# Patient Record
Sex: Male | Born: 1935 | Race: White | Hispanic: No | Smoking: Former smoker
Health system: Southern US, Community
[De-identification: ages and names within clinical notes are randomized; demographics above are authoritative.]

## PROBLEM LIST (undated history)

## (undated) DIAGNOSIS — Z923 Personal history of irradiation: Secondary | ICD-10-CM

## (undated) DIAGNOSIS — K469 Unspecified abdominal hernia without obstruction or gangrene: Secondary | ICD-10-CM

## (undated) DIAGNOSIS — R011 Cardiac murmur, unspecified: Secondary | ICD-10-CM

## (undated) DIAGNOSIS — C4339 Malignant melanoma of other parts of face: Secondary | ICD-10-CM

## (undated) DIAGNOSIS — C9110 Chronic lymphocytic leukemia of B-cell type not having achieved remission: Secondary | ICD-10-CM

## (undated) DIAGNOSIS — I493 Ventricular premature depolarization: Secondary | ICD-10-CM

## (undated) DIAGNOSIS — I10 Essential (primary) hypertension: Secondary | ICD-10-CM

## (undated) DIAGNOSIS — N400 Enlarged prostate without lower urinary tract symptoms: Secondary | ICD-10-CM

## (undated) DIAGNOSIS — C434 Malignant melanoma of scalp and neck: Secondary | ICD-10-CM

## (undated) DIAGNOSIS — E119 Type 2 diabetes mellitus without complications: Secondary | ICD-10-CM

## (undated) HISTORY — DX: Type 2 diabetes mellitus without complications: E11.9

## (undated) HISTORY — DX: Essential (primary) hypertension: I10

## (undated) HISTORY — PX: EYE SURGERY: SHX253

## (undated) HISTORY — DX: Chronic lymphocytic leukemia of B-cell type not having achieved remission: C91.10

## (undated) HISTORY — DX: Benign prostatic hyperplasia without lower urinary tract symptoms: N40.0

## (undated) HISTORY — PX: PROSTATE SURGERY: SHX751

---

## 2016-01-24 DIAGNOSIS — Z08 Encounter for follow-up examination after completed treatment for malignant neoplasm: Secondary | ICD-10-CM | POA: Diagnosis not present

## 2016-01-24 DIAGNOSIS — D0339 Melanoma in situ of other parts of face: Secondary | ICD-10-CM | POA: Diagnosis not present

## 2016-01-24 DIAGNOSIS — L570 Actinic keratosis: Secondary | ICD-10-CM | POA: Diagnosis not present

## 2016-01-24 DIAGNOSIS — X32XXXA Exposure to sunlight, initial encounter: Secondary | ICD-10-CM | POA: Diagnosis not present

## 2016-01-24 DIAGNOSIS — Z85828 Personal history of other malignant neoplasm of skin: Secondary | ICD-10-CM | POA: Diagnosis not present

## 2016-02-06 DIAGNOSIS — E119 Type 2 diabetes mellitus without complications: Secondary | ICD-10-CM | POA: Diagnosis not present

## 2016-02-06 DIAGNOSIS — I493 Ventricular premature depolarization: Secondary | ICD-10-CM | POA: Diagnosis not present

## 2016-02-06 DIAGNOSIS — E785 Hyperlipidemia, unspecified: Secondary | ICD-10-CM | POA: Diagnosis not present

## 2016-02-06 DIAGNOSIS — I10 Essential (primary) hypertension: Secondary | ICD-10-CM | POA: Diagnosis not present

## 2016-02-21 DIAGNOSIS — D0339 Melanoma in situ of other parts of face: Secondary | ICD-10-CM | POA: Diagnosis not present

## 2016-02-27 DIAGNOSIS — N419 Inflammatory disease of prostate, unspecified: Secondary | ICD-10-CM | POA: Diagnosis not present

## 2016-02-27 DIAGNOSIS — R309 Painful micturition, unspecified: Secondary | ICD-10-CM | POA: Diagnosis not present

## 2016-03-01 DIAGNOSIS — R509 Fever, unspecified: Secondary | ICD-10-CM | POA: Diagnosis not present

## 2016-03-01 DIAGNOSIS — N419 Inflammatory disease of prostate, unspecified: Secondary | ICD-10-CM | POA: Diagnosis not present

## 2016-04-09 DIAGNOSIS — L089 Local infection of the skin and subcutaneous tissue, unspecified: Secondary | ICD-10-CM | POA: Diagnosis not present

## 2016-04-09 DIAGNOSIS — E1169 Type 2 diabetes mellitus with other specified complication: Secondary | ICD-10-CM | POA: Diagnosis not present

## 2016-04-18 DIAGNOSIS — E1142 Type 2 diabetes mellitus with diabetic polyneuropathy: Secondary | ICD-10-CM | POA: Diagnosis not present

## 2016-04-18 DIAGNOSIS — B353 Tinea pedis: Secondary | ICD-10-CM | POA: Diagnosis not present

## 2016-04-18 DIAGNOSIS — S90822A Blister (nonthermal), left foot, initial encounter: Secondary | ICD-10-CM | POA: Diagnosis not present

## 2016-04-18 DIAGNOSIS — L089 Local infection of the skin and subcutaneous tissue, unspecified: Secondary | ICD-10-CM | POA: Diagnosis not present

## 2016-06-26 DIAGNOSIS — D485 Neoplasm of uncertain behavior of skin: Secondary | ICD-10-CM | POA: Diagnosis not present

## 2016-06-26 DIAGNOSIS — Z08 Encounter for follow-up examination after completed treatment for malignant neoplasm: Secondary | ICD-10-CM | POA: Diagnosis not present

## 2016-06-26 DIAGNOSIS — L821 Other seborrheic keratosis: Secondary | ICD-10-CM | POA: Diagnosis not present

## 2016-06-26 DIAGNOSIS — D225 Melanocytic nevi of trunk: Secondary | ICD-10-CM | POA: Diagnosis not present

## 2016-06-26 DIAGNOSIS — Z85828 Personal history of other malignant neoplasm of skin: Secondary | ICD-10-CM | POA: Diagnosis not present

## 2016-06-26 DIAGNOSIS — Z1283 Encounter for screening for malignant neoplasm of skin: Secondary | ICD-10-CM | POA: Diagnosis not present

## 2016-07-05 DIAGNOSIS — E785 Hyperlipidemia, unspecified: Secondary | ICD-10-CM | POA: Diagnosis not present

## 2016-07-05 DIAGNOSIS — E119 Type 2 diabetes mellitus without complications: Secondary | ICD-10-CM | POA: Diagnosis not present

## 2016-07-05 DIAGNOSIS — I10 Essential (primary) hypertension: Secondary | ICD-10-CM | POA: Diagnosis not present

## 2016-07-08 DIAGNOSIS — D72828 Other elevated white blood cell count: Secondary | ICD-10-CM | POA: Diagnosis not present

## 2016-07-15 DIAGNOSIS — Z87891 Personal history of nicotine dependence: Secondary | ICD-10-CM | POA: Diagnosis not present

## 2016-07-15 DIAGNOSIS — D72820 Lymphocytosis (symptomatic): Secondary | ICD-10-CM | POA: Diagnosis not present

## 2016-07-15 DIAGNOSIS — C4492 Squamous cell carcinoma of skin, unspecified: Secondary | ICD-10-CM | POA: Diagnosis not present

## 2016-07-15 DIAGNOSIS — D696 Thrombocytopenia, unspecified: Secondary | ICD-10-CM | POA: Diagnosis not present

## 2016-07-15 DIAGNOSIS — C9110 Chronic lymphocytic leukemia of B-cell type not having achieved remission: Secondary | ICD-10-CM | POA: Diagnosis not present

## 2016-07-15 DIAGNOSIS — Z803 Family history of malignant neoplasm of breast: Secondary | ICD-10-CM | POA: Diagnosis not present

## 2016-07-15 DIAGNOSIS — L989 Disorder of the skin and subcutaneous tissue, unspecified: Secondary | ICD-10-CM | POA: Diagnosis not present

## 2016-07-19 ENCOUNTER — Other Ambulatory Visit: Payer: Self-pay | Admitting: Oncology

## 2016-07-19 DIAGNOSIS — C9110 Chronic lymphocytic leukemia of B-cell type not having achieved remission: Secondary | ICD-10-CM

## 2016-07-19 DIAGNOSIS — D696 Thrombocytopenia, unspecified: Secondary | ICD-10-CM

## 2016-07-23 ENCOUNTER — Encounter: Payer: Self-pay | Admitting: Oncology

## 2016-07-23 ENCOUNTER — Ambulatory Visit (INDEPENDENT_AMBULATORY_CARE_PROVIDER_SITE_OTHER): Payer: PPO | Admitting: Oncology

## 2016-07-23 ENCOUNTER — Encounter (INDEPENDENT_AMBULATORY_CARE_PROVIDER_SITE_OTHER): Payer: Self-pay

## 2016-07-23 VITALS — BP 169/70 | HR 62 | Temp 97.6°F | Ht 71.0 in | Wt 187.6 lb

## 2016-07-23 DIAGNOSIS — N400 Enlarged prostate without lower urinary tract symptoms: Secondary | ICD-10-CM

## 2016-07-23 DIAGNOSIS — R161 Splenomegaly, not elsewhere classified: Secondary | ICD-10-CM | POA: Diagnosis not present

## 2016-07-23 DIAGNOSIS — Q928 Other specified trisomies and partial trisomies of autosomes: Secondary | ICD-10-CM

## 2016-07-23 DIAGNOSIS — Z8042 Family history of malignant neoplasm of prostate: Secondary | ICD-10-CM

## 2016-07-23 DIAGNOSIS — E119 Type 2 diabetes mellitus without complications: Secondary | ICD-10-CM

## 2016-07-23 DIAGNOSIS — E785 Hyperlipidemia, unspecified: Secondary | ICD-10-CM

## 2016-07-23 DIAGNOSIS — D696 Thrombocytopenia, unspecified: Secondary | ICD-10-CM | POA: Diagnosis not present

## 2016-07-23 DIAGNOSIS — Z820 Family history of epilepsy and other diseases of the nervous system: Secondary | ICD-10-CM

## 2016-07-23 DIAGNOSIS — C9110 Chronic lymphocytic leukemia of B-cell type not having achieved remission: Secondary | ICD-10-CM

## 2016-07-23 DIAGNOSIS — I10 Essential (primary) hypertension: Secondary | ICD-10-CM

## 2016-07-23 DIAGNOSIS — Z8052 Family history of malignant neoplasm of bladder: Secondary | ICD-10-CM

## 2016-07-23 DIAGNOSIS — Z87891 Personal history of nicotine dependence: Secondary | ICD-10-CM

## 2016-07-23 DIAGNOSIS — Z803 Family history of malignant neoplasm of breast: Secondary | ICD-10-CM

## 2016-07-23 DIAGNOSIS — D72820 Lymphocytosis (symptomatic): Secondary | ICD-10-CM | POA: Diagnosis not present

## 2016-07-23 DIAGNOSIS — Z818 Family history of other mental and behavioral disorders: Secondary | ICD-10-CM

## 2016-07-23 LAB — DIRECT ANTIGLOBULIN TEST (NOT AT ARMC)
DAT, COMPLEMENT: NEGATIVE
DAT, IGG: NEGATIVE

## 2016-07-23 LAB — CBC WITH DIFFERENTIAL/PLATELET
BASOS ABS: 0 10*3/uL (ref 0.0–0.1)
Basophils Relative: 0 %
EOS ABS: 0.1 10*3/uL (ref 0.0–0.7)
Eosinophils Relative: 1 %
HCT: 40.2 % (ref 39.0–52.0)
Hemoglobin: 13.3 g/dL (ref 13.0–17.0)
LYMPHS ABS: 10.4 10*3/uL — AB (ref 0.7–4.0)
Lymphocytes Relative: 76 %
MCH: 29.6 pg (ref 26.0–34.0)
MCHC: 33.1 g/dL (ref 30.0–36.0)
MCV: 89.5 fL (ref 78.0–100.0)
MONO ABS: 0.5 10*3/uL (ref 0.1–1.0)
Monocytes Relative: 4 %
NEUTROS PCT: 19 %
Neutro Abs: 2.6 10*3/uL (ref 1.7–7.7)
PLATELETS: 121 10*3/uL — AB (ref 150–400)
RBC: 4.49 MIL/uL (ref 4.22–5.81)
RDW: 14.6 % (ref 11.5–15.5)
WBC: 13.6 10*3/uL — AB (ref 4.0–10.5)

## 2016-07-23 LAB — RETICULOCYTES
RBC.: 4.49 MIL/uL (ref 4.22–5.81)
Retic Count, Absolute: 62.9 10*3/uL (ref 19.0–186.0)
Retic Ct Pct: 1.4 % (ref 0.4–3.1)

## 2016-07-23 LAB — LACTATE DEHYDROGENASE: LDH: 150 U/L (ref 98–192)

## 2016-07-23 NOTE — Patient Instructions (Signed)
We will call you with appointment for a CT scan of the abdomen to be done at [facility] hospital Radiology dept Blood counts in 2 months and again in 4 months MD visit with Dr Darnell Level in 4 months

## 2016-07-24 LAB — IMMUNOFIXATION ELECTROPHORESIS
IGA: 211 mg/dL (ref 61–437)
IgG (Immunoglobin G), Serum: 743 mg/dL (ref 700–1600)
IgM, Serum: 59 mg/dL (ref 15–143)
Total Protein ELP: 6.5 g/dL (ref 6.0–8.5)

## 2016-07-24 LAB — SAVE SMEAR

## 2016-07-24 LAB — PATHOLOGIST SMEAR REVIEW

## 2016-07-24 NOTE — Progress Notes (Signed)
New Patient Hematology   Thomas Zavala OU:[id number] 1927/11/01 82 y.o. 07/24/2016  CC: Bradly Bienenstock NP; Everardo All, MD [facility] oncology specialists [place], [place], [place]   Reason for referral: Further management of recently diagnosed chronic lymphocytic leukemia   HPI:  Pleasant 82 year old man who has been in overall good health with no major medical or surgical illness. He has treated hypertension and type 2 diabetes on metformin. Benign prostatic hyperplasia. Transurethral resection of the prostate about 20 years ago. Repeat surgery in April of this year. He was told white count was elevated at that time. White count was 18,800, hemoglobin 12, platelet 171,000, on 12/07/2015. He had not had a general physical exam in over one year. He just relocated here from [place]. He was evaluated by a local nurse practitioner in [place]. Routine laboratory analysis done on 07/08/2016 showed a elevated white count 27,800, hemoglobin 13.4, platelet count 101,000. White count differential not done on that specimen. He was referred for an urgent hematology oncology evaluation and saw Dr. Don Broach who was kind enough to call me and send records. Due to insurance reasons patient requests change of oncologists. Dr. Tressie Stalker repeated the CBC with a differential on November 27. White count 27,000 with 22 neutrophils, 74% lymphocytes, hemoglobin 14, hematocrit 41, MCV 86, platelet count 111,000. Review of the peripheral blood showed a predominant population of mature lymphocytes and presence of smudge cells. He did not appreciate any adenopathy or organomegaly on his exam. He was concerned with the thrombocytopenia in the absence of anemia that there may be another reason for the low platelets, potentially alcohol-related liver disease. Chemistry profile showed normal liver functions. He recommended the patient get a CT scan of the abdomen for further  evaluation.  A specimen of peripheral blood was sent for flow cytometry and confirmed suspicion of chronic lymphocytic leukemia with a monoclonal population of B cells with a CLL/SLL phenotype.   cytogenetics: trisomy 26  [place] gene mutation status: unmutated  He has had no constitutional symptoms i.e. no fever, weight loss, or night sweats. He does admit to easy bruisability. He has noted some erythematous changes of the skin of his feet.  He had the shingles vaccine about 5 years ago. Pneumonia vaccine 1 year ago. Flu vaccine this year.  PMH:  Medical history: Hypertension; type 2 diabetes; BPH. No history of MI, ulcers, hepatitis, yellow jaundice, malaria, mononucleosis, tuberculosis, pneumonia, asthma, emphysema, thyroid disease, inflammatory arthritis, seizure, or stroke.   Surgical history: Transurethral resection of the prostate 20 years ago. Recent laser procedure on the prostate April 2017.  Allergies: No Known Allergies  Medications: Hyzaar 100-25 one daily, metformin XL or 500 mg 1 daily, Lopressor 50 mg 3 times a day, pravastatin 40 mg daily.  Family History: His father lived  toage 99-1/2. Mother lived into her 41s. Had dementia. He had 6 siblings. A set of male twins died 2 weeks after birth. One brother died of bladder cancer. Another brother had prostate cancer but this did not result in his death. A sister died at age 63 or 18 unknown causes. Another brother is deceased. One surviving sibling a sister aged 105 who is in good health.  Social History: Retired. 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 First wife died about 9 years ago. Diagnosis of lymphoma made day before she died by liver biopsy. Second wife just died in March 0000000 of complications of alcohol related cirrhosis. 3 daughters. One daughter accompanies  him today, Lanelle Bal, diagnosed with breast cancer at age approximately 34, currently in remission. Another daughter alive and well in [place]. A  third daughter who lives in [place] has cerebral palsy but is living independently.  He quit smoking after 25 years of use in 1978.[name] He has never used smokeless tobacco. His  alcohol consumption heavy in the past but he will not quantify how much. He stopped drinking completely over the last year and a half when his wife was struggling with alcoholic liver disease. He currently just drinks an occasional glass of wine or beer. He does not use drugs.   Review of Systems: See history of present illness Remaining ROS negative.  Physical Exam: Blood pressure (!) 169/70, pulse 62, temperature 97.6 F (36.4 C), temperature source Oral, height 5\' 11"  (1.803 m), weight 187 lb 9.6 oz (85.1 kg), SpO2 100 %. Wt Readings from Last 3 Encounters:  07/23/16 187 lb 9.6 oz (85.1 kg)     General appearance: Well nourished Caucasian man HENNT: Pharynx no erythema, exudate, mass, or ulcer. No thyromegaly or thyroid nodules Lymph nodes: No cervical, supraclavicular, or axillary lymphadenopathy Breasts:  Lungs: Clear to auscultation, resonant to percussion throughout Heart: Regular rhythm, no murmur, no gallop, no rub, no click, no edema Abdomen: Soft, nontender, normal bowel sounds, no mass, Spleen palpable 5 cm below left costal margin with firm edge. No hepatomegaly. Extremities: No edema, no calf tenderness Musculoskeletal: no joint deformities GU: No inguinal adenopathy Vascular: Carotid pulses 2+, no bruits, distal pulses: Dorsalis pedis 1+ symmetric Neurologic: Alert, oriented, PERRLA, optic discs sharp and vessels normal, no hemorrhage or exudate, cranial nerves grossly normal, motor strength 5 over 5, reflexes 1+ symmetric, upper body coordination normal, gait normal, Skin: No rash or ecchymosis    Lab Results: Lab Results  Component Value Date   WBC 13.6 (H) 07/23/2016   HGB 13.3 07/23/2016   HCT 40.2 07/23/2016   MCV 89.5 07/23/2016   PLT 121 (L) 07/23/2016     Chemistry   No results  found for: NA, K, CL, CO2, BUN, CREATININE, GLU No results found for: CALCIUM, ALKPHOS, AST, ALT, BILITOT  White count differential: 76% lymphocytes, 19 neutrophils, 4 monocytes, 1 eosinophil  Direct [place] test negative, reticulocyte count 1.4%, LDH 150 lab normal 192 or below Immunoglobulins: normal and no monoclonal protein on IFE  On 07/15/2016: Random glucose 136, BUN 12, creatinine 1.0, bilirubin 0.5, alkaline phosphatase 89, SGOT 18, SGPT 14    Pathology: See discussion above. Flow cytometry peripheral blood consistent with CLL   Review of peripheral blood film:  Normochromic normocytic red cells, no spherocytes, schistocytes, or polychromasia. Monomorphic population of  Lymphocytes. Many of the lymphocytes are abnormal with lobulated nuclei, abundant, pale blue cytoplasm, more suggestive of a T rather than B cell process. Presence of smudge cells.   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 Splenomegaly in the absence of lymphadenopathy. Only mild leukocytosis.  Although splenomegaly likely secondary to CLL, I agree with Dr. Tressie Stalker, it would be of value to take a look at his liver with additional imaging to rule out portal hypertension as the etiology of the splenomegaly.  Diagnosis, prognosis, treatment options, discussed at length with the patient and his daughter Lanelle Bal. Written summary given to them for review. He is currently asymptomatic, still in early stage, preferred treatment at this time is observation alone. Although unmutated heavy chain immunoglobulin status is a negative prognostic feature, outside of a clinical  trial, we are currently not treating these patients differently at presentation if still at early stage.  We reviewed current advances in the treatment of CLL. He has no underlying cardiac history. He would be a good candidate for a B cell receptor antagonist such as Ibrutinib when and  if treatment is required which may not be for many years. Alternative treatment that would be reasonable for a man of his age would be a combination of chlorambucil plus Rituxan or Obinutuzumab anti-CD20 antibody therapy.  Plan at this time is to repeat blood counts in 2 months, 4 months, to assess pace of disease. CT scan of the abdomen and pelvis for better assessment of his liver and spleen and any additional adenopathy.    Murriel Hopper, MD, [place]  Hematology-Oncology/Internal Medicine  07/24/2016, 9:23 AM

## 2016-07-25 ENCOUNTER — Encounter: Payer: Self-pay | Admitting: Oncology

## 2016-07-25 DIAGNOSIS — I10 Essential (primary) hypertension: Secondary | ICD-10-CM | POA: Insufficient documentation

## 2016-07-25 DIAGNOSIS — E785 Hyperlipidemia, unspecified: Secondary | ICD-10-CM | POA: Insufficient documentation

## 2016-07-25 DIAGNOSIS — N400 Enlarged prostate without lower urinary tract symptoms: Secondary | ICD-10-CM | POA: Insufficient documentation

## 2016-07-25 DIAGNOSIS — C9110 Chronic lymphocytic leukemia of B-cell type not having achieved remission: Secondary | ICD-10-CM | POA: Insufficient documentation

## 2016-07-25 DIAGNOSIS — R161 Splenomegaly, not elsewhere classified: Secondary | ICD-10-CM | POA: Insufficient documentation

## 2016-07-25 DIAGNOSIS — D696 Thrombocytopenia, unspecified: Secondary | ICD-10-CM | POA: Insufficient documentation

## 2016-07-25 DIAGNOSIS — E119 Type 2 diabetes mellitus without complications: Secondary | ICD-10-CM | POA: Insufficient documentation

## 2016-07-25 HISTORY — DX: Type 2 diabetes mellitus without complications: E11.9

## 2016-07-25 HISTORY — DX: Benign prostatic hyperplasia without lower urinary tract symptoms: N40.0

## 2016-07-31 ENCOUNTER — Ambulatory Visit (HOSPITAL_COMMUNITY): Admission: RE | Admit: 2016-07-31 | Payer: PPO | Source: Ambulatory Visit

## 2016-08-07 DIAGNOSIS — D485 Neoplasm of uncertain behavior of skin: Secondary | ICD-10-CM | POA: Diagnosis not present

## 2016-08-07 DIAGNOSIS — X32XXXD Exposure to sunlight, subsequent encounter: Secondary | ICD-10-CM | POA: Diagnosis not present

## 2016-08-07 DIAGNOSIS — L089 Local infection of the skin and subcutaneous tissue, unspecified: Secondary | ICD-10-CM | POA: Diagnosis not present

## 2016-08-07 DIAGNOSIS — C44622 Squamous cell carcinoma of skin of right upper limb, including shoulder: Secondary | ICD-10-CM | POA: Diagnosis not present

## 2016-08-07 DIAGNOSIS — L570 Actinic keratosis: Secondary | ICD-10-CM | POA: Diagnosis not present

## 2016-08-23 ENCOUNTER — Telehealth: Payer: Self-pay | Admitting: *Deleted

## 2016-08-23 DIAGNOSIS — C9110 Chronic lymphocytic leukemia of B-cell type not having achieved remission: Secondary | ICD-10-CM

## 2016-08-23 NOTE — Telephone Encounter (Signed)
Dr Beryle Beams informed.

## 2016-08-23 NOTE — Telephone Encounter (Signed)
Call from radiology - pt is having CT on 1/9 at [facility] and will need creatinine level. States if order is placed; it can be drawn when pt arrives for his appt.  Order entered - please check.

## 2016-08-27 ENCOUNTER — Ambulatory Visit (HOSPITAL_COMMUNITY): Admission: RE | Admit: 2016-08-27 | Payer: PPO | Source: Ambulatory Visit

## 2016-09-03 ENCOUNTER — Ambulatory Visit (HOSPITAL_COMMUNITY)
Admission: RE | Admit: 2016-09-03 | Discharge: 2016-09-03 | Disposition: A | Discharge status: Home / Self Care | Payer: PPO | Source: Ambulatory Visit | Attending: Oncology | Admitting: Oncology

## 2016-09-03 ENCOUNTER — Encounter (HOSPITAL_COMMUNITY): Payer: Self-pay

## 2016-09-03 DIAGNOSIS — C9110 Chronic lymphocytic leukemia of B-cell type not having achieved remission: Secondary | ICD-10-CM | POA: Diagnosis not present

## 2016-09-03 DIAGNOSIS — R161 Splenomegaly, not elsewhere classified: Secondary | ICD-10-CM | POA: Diagnosis not present

## 2016-09-03 DIAGNOSIS — D696 Thrombocytopenia, unspecified: Secondary | ICD-10-CM | POA: Insufficient documentation

## 2016-09-03 DIAGNOSIS — R911 Solitary pulmonary nodule: Secondary | ICD-10-CM | POA: Diagnosis not present

## 2016-09-03 DIAGNOSIS — K5730 Diverticulosis of large intestine without perforation or abscess without bleeding: Secondary | ICD-10-CM | POA: Diagnosis not present

## 2016-09-03 DIAGNOSIS — D1803 Hemangioma of intra-abdominal structures: Secondary | ICD-10-CM | POA: Diagnosis not present

## 2016-09-03 DIAGNOSIS — N400 Enlarged prostate without lower urinary tract symptoms: Secondary | ICD-10-CM | POA: Insufficient documentation

## 2016-09-03 DIAGNOSIS — R591 Generalized enlarged lymph nodes: Secondary | ICD-10-CM | POA: Diagnosis not present

## 2016-09-03 LAB — POCT I-STAT CREATININE
CREATININE: 1 mg/dL (ref 0.61–1.24)
Creatinine, Ser: 1 mg/dL (ref 0.61–1.24)

## 2016-09-03 IMAGING — CT CT ABD-PELV W/ CM
2 of 5 series · 16 of 46 positions shown, 18 images · IV contrast (APPLIED)
Comparison: None.

CLINICAL DATA: Newly diagnosed chronic lymphocytic leukemia.
Splenomegaly. Thrombocytopenia.

EXAM:
CT ABDOMEN AND PELVIS WITH CONTRAST
TECHNIQUE: Multidetector CT imaging of the abdomen and pelvis was performed
using the standard protocol following bolus administration of
intravenous contrast.
CONTRAST:  100 mL Omnipaque 300

[Series 2: axial st · axial · 0.79mm/px · z∈[-239,+136]mm · 13 of 85 slices shown, 15 images]
[im 5/85  soft-tissue]
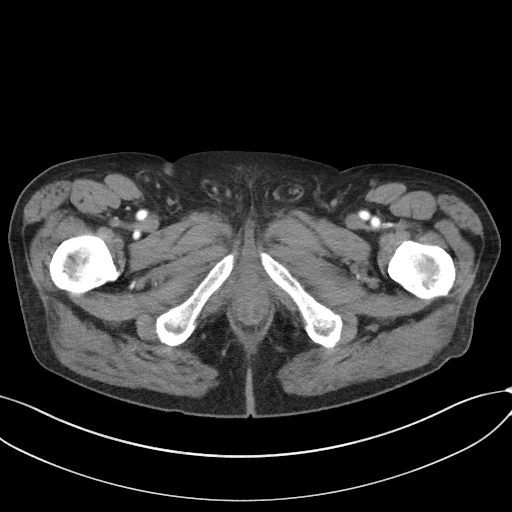
[im 5/85  bone]
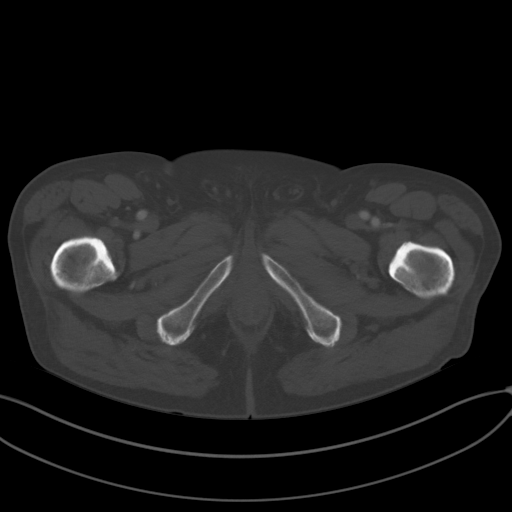
[im 14/85  soft-tissue]
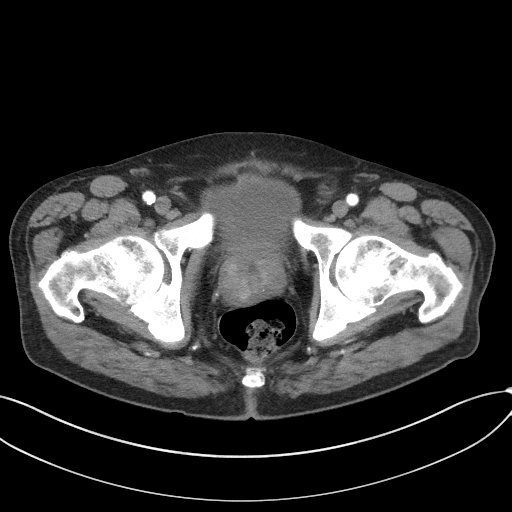
[im 18/85  soft-tissue]
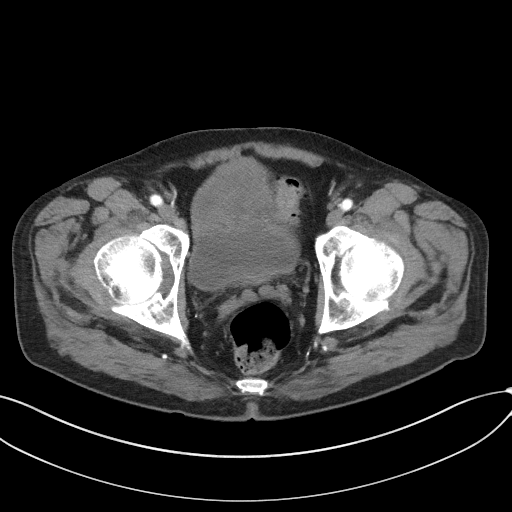
[im 23/85  soft-tissue]
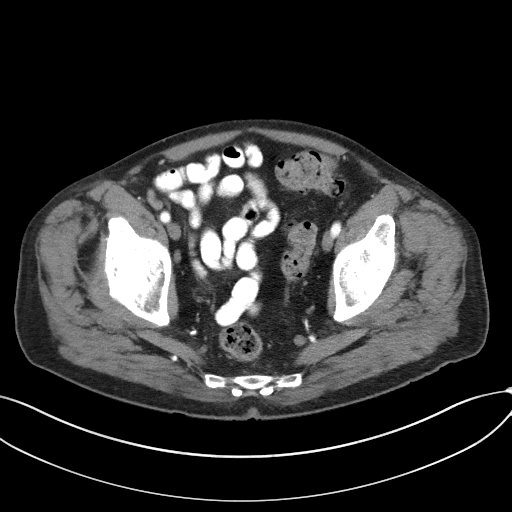
[im 31/85  soft-tissue]
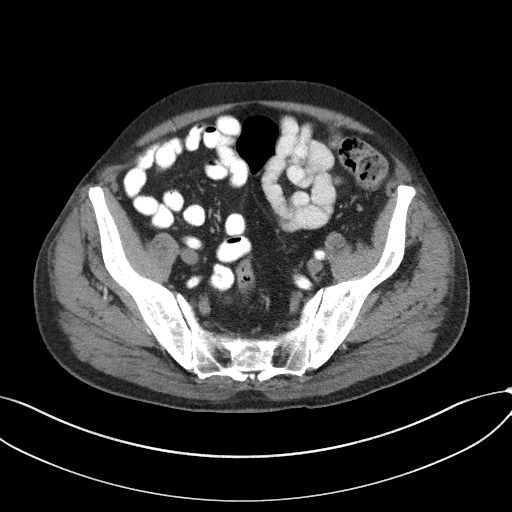
[im 36/85  soft-tissue]
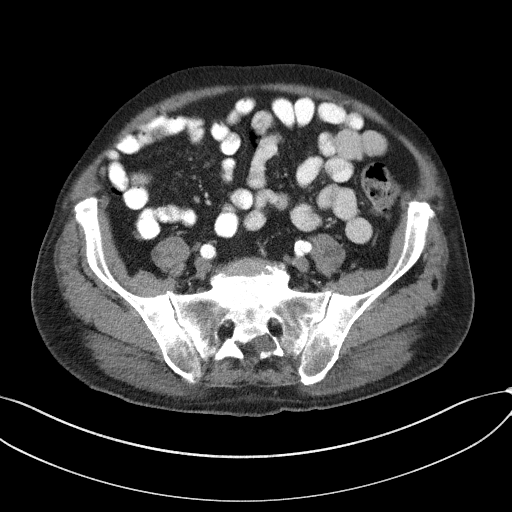
[im 45/85  soft-tissue]
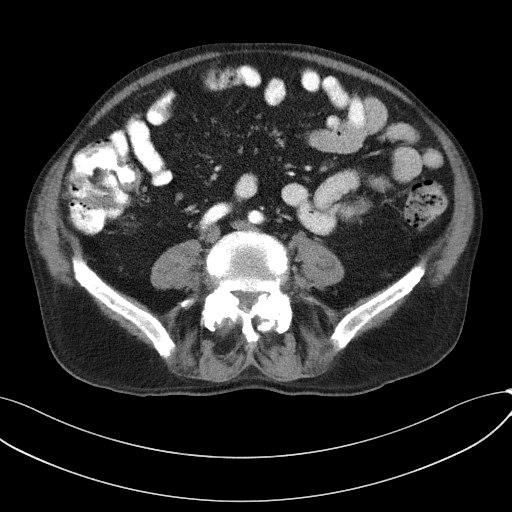
[im 49/85  soft-tissue]
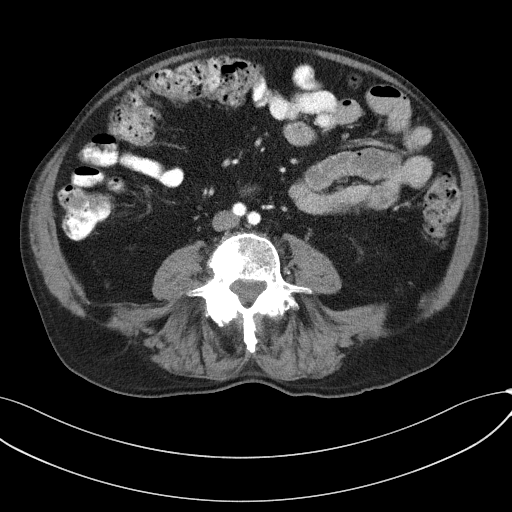
[im 54/85  soft-tissue]
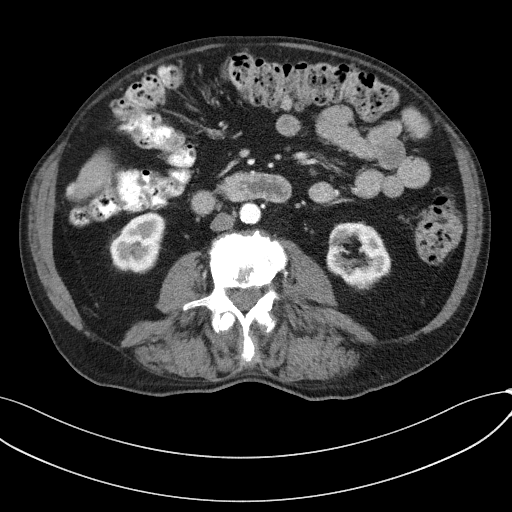
[im 54/85  bone]
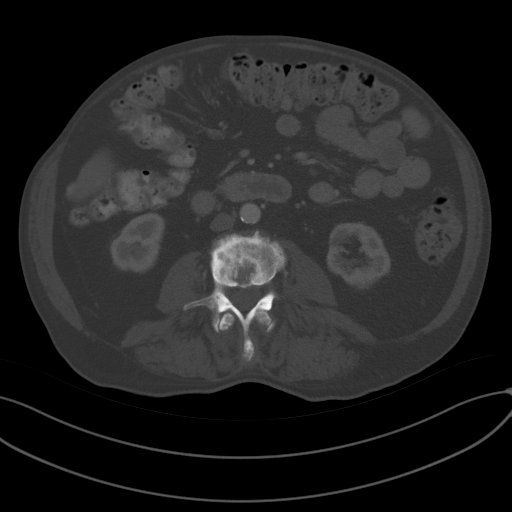
[im 62/85  soft-tissue]
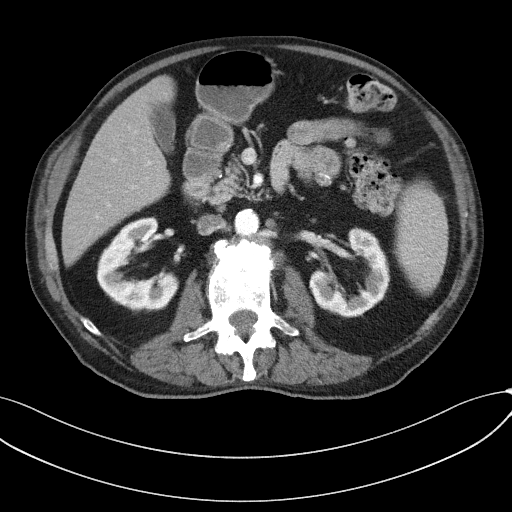
[im 67/85  soft-tissue]
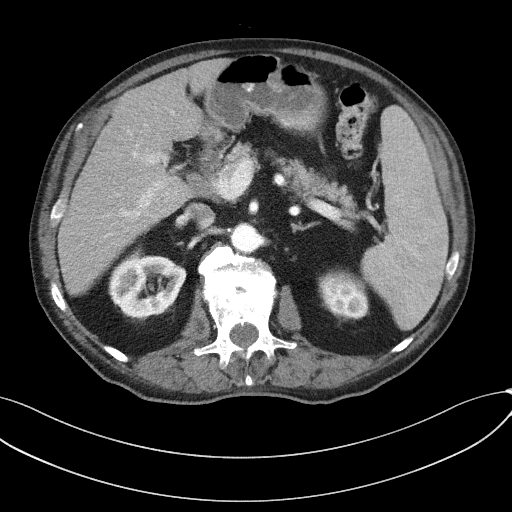
[im 71/85  soft-tissue]
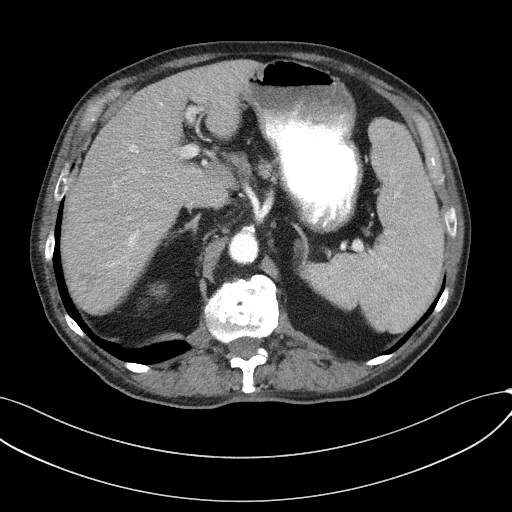
[im 80/85  soft-tissue]
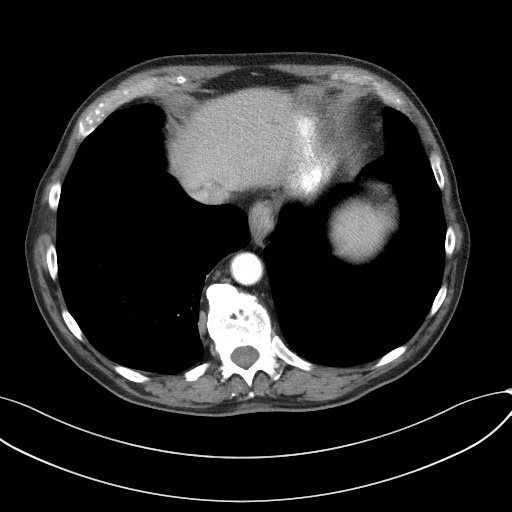

[Series 4: coronal st · coronal · 0.76mm/px · 3 of 99 slices shown]
[im 33/99  soft-tissue]
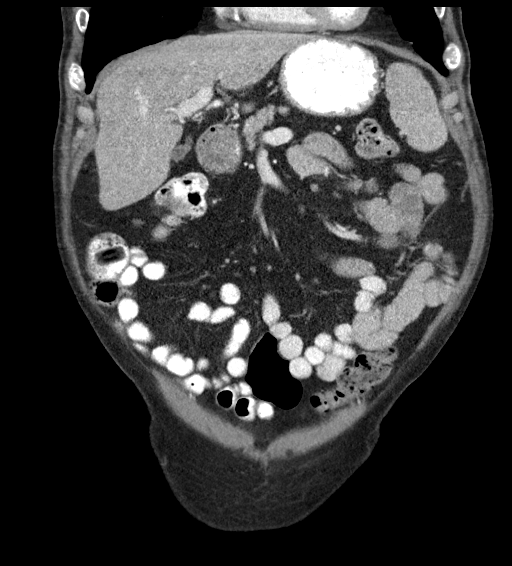
[im 44/99  soft-tissue]
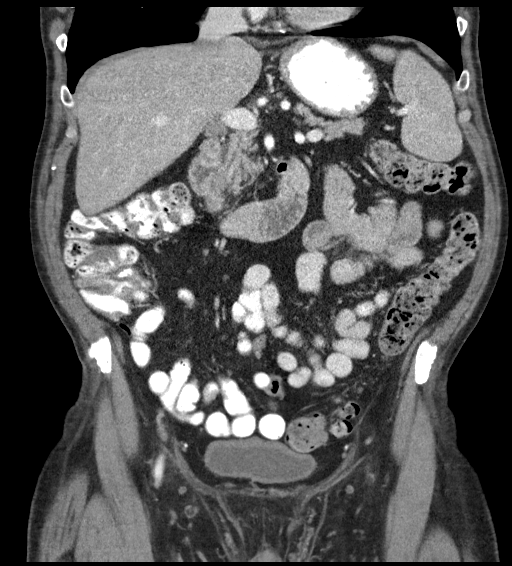
[im 55/99  soft-tissue]
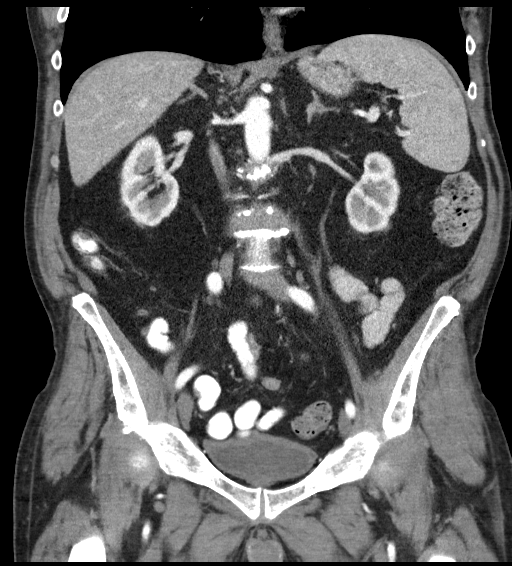

[16 of 46 positions shown; findings below may reference images not displayed]

FINDINGS: Lower Chest: 4 mm pulmonary nodule in inferior aspect of right
middle lobe.

Hepatobiliary: 2 cm lesion in posterior right hepatic lobe with
discontinuous peripheral nodular enhancement, consistent with benign
hemangioma. No other liver masses identified. Gallbladder is
unremarkable.

Pancreas:  No evidence of mass or inflammatory changes.

Spleen: Spleen measures 8.8 x 15.6 x 5.4 cm (volume = 390 cm^3).
Small low-attenuation lesion anterior aspect of spleen measuring
cm. No other splenic lesions identified.

Adrenals/Urinary Tract: No masses identified. No evidence of
hydronephrosis. Mild diffuse bladder wall thickening, most likely
due to chronic bladder outlet obstruction given enlarged prostate.

Stomach/Bowel: No evidence of obstruction, inflammatory process or
abnormal fluid collections. Colonic diverticulosis noted, without
evidence of diverticulitis.

Vascular/Lymphatic: Mildly enlarged abdominal lymph nodes seen in
the gastrohepatic ligament measuring 11 mm on image [DATE], and in the
porta hepatis measuring 14 mm on image [DATE]. Tiny less than 1 cm
lymph nodes are seen within the abdominal retroperitoneum and
bilateral iliac chains. A 10 mm right external iliac lymph node is
seen on image 56/2.

Reproductive:  Mildly enlarged prostate gland with TURP defect.

Other:  None.

Musculoskeletal: No suspicious bone lesions identified. Degenerative
lumbar spondylosis and multiple Schmorl's nodes noted.
IMPRESSION: Borderline splenomegaly with 1.3 cm low-attenuation lesion in the
anterior aspect of spleen.

Mild upper abdominal and right iliac lymphadenopathy.

4 mm indeterminate pulmonary nodule in right lung base. Recommend
continued attention on follow-up CT.

Incidentally noted benign right hepatic lobe hemangioma, mildly and
enlarged prostate, and colonic diverticulosis.

## 2016-09-03 MED ORDER — IOPAMIDOL (ISOVUE-300) INJECTION 61%
100.0000 mL | Freq: Once | INTRAVENOUS | Status: AC | PRN
  Administered 2016-09-03: 100 mL via INTRAVENOUS

## 2016-09-03 MED ORDER — IOPAMIDOL (ISOVUE-300) INJECTION 61%
INTRAVENOUS | Status: AC
  Filled 2016-09-03: qty 100

## 2016-09-24 ENCOUNTER — Telehealth: Payer: Self-pay | Admitting: Oncology

## 2016-09-24 NOTE — Telephone Encounter (Signed)
Wanted to know if he should fast for labwork, was told no he didn't need to

## 2016-09-24 NOTE — Telephone Encounter (Signed)
Pt has question for nurse °

## 2016-09-25 ENCOUNTER — Other Ambulatory Visit (INDEPENDENT_AMBULATORY_CARE_PROVIDER_SITE_OTHER): Payer: PPO

## 2016-09-25 DIAGNOSIS — R161 Splenomegaly, not elsewhere classified: Secondary | ICD-10-CM | POA: Diagnosis not present

## 2016-09-25 DIAGNOSIS — C9110 Chronic lymphocytic leukemia of B-cell type not having achieved remission: Secondary | ICD-10-CM | POA: Diagnosis not present

## 2016-09-25 DIAGNOSIS — D696 Thrombocytopenia, unspecified: Secondary | ICD-10-CM

## 2016-09-26 ENCOUNTER — Telehealth: Payer: Self-pay | Admitting: *Deleted

## 2016-09-26 ENCOUNTER — Other Ambulatory Visit: Payer: Self-pay | Admitting: Oncology

## 2016-09-26 LAB — CBC WITH DIFFERENTIAL/PLATELET
BASOS: 0 %
Basophils Absolute: 0.1 10*3/uL (ref 0.0–0.2)
EOS (ABSOLUTE): 0.1 10*3/uL (ref 0.0–0.4)
EOS: 1 %
HEMATOCRIT: 41.8 % (ref 37.5–51.0)
HEMOGLOBIN: 13.7 g/dL (ref 13.0–17.7)
IMMATURE GRANS (ABS): 0 10*3/uL (ref 0.0–0.1)
IMMATURE GRANULOCYTES: 0 %
Lymphocytes Absolute: 21 10*3/uL — ABNORMAL HIGH (ref 0.7–3.1)
Lymphs: 88 %
MCH: 30.1 pg (ref 26.6–33.0)
MCHC: 32.8 g/dL (ref 31.5–35.7)
MCV: 92 fL (ref 79–97)
MONOS ABS: 0.3 10*3/uL (ref 0.1–0.9)
Monocytes: 1 %
NEUTROS PCT: 10 %
Neutrophils Absolute: 2.5 10*3/uL (ref 1.4–7.0)
Platelets: 114 10*3/uL — ABNORMAL LOW (ref 150–379)
RBC: 4.55 x10E6/uL (ref 4.14–5.80)
RDW: 14.8 % (ref 12.3–15.4)
WBC: 24 10*3/uL — AB (ref 3.4–10.8)

## 2016-09-26 LAB — COMPREHENSIVE METABOLIC PANEL
A/G RATIO: 2 (ref 1.2–2.2)
ALT: 16 IU/L (ref 0–44)
AST: 21 IU/L (ref 0–40)
Albumin: 4.4 g/dL (ref 3.5–4.7)
Alkaline Phosphatase: 88 IU/L (ref 39–117)
BILIRUBIN TOTAL: 0.7 mg/dL (ref 0.0–1.2)
BUN/Creatinine Ratio: 12 (ref 10–24)
BUN: 12 mg/dL (ref 8–27)
CHLORIDE: 94 mmol/L — AB (ref 96–106)
CO2: 27 mmol/L (ref 18–29)
Calcium: 9.1 mg/dL (ref 8.6–10.2)
Creatinine, Ser: 1.03 mg/dL (ref 0.76–1.27)
GFR calc non Af Amer: 65 mL/min/{1.73_m2} (ref >59)
GFR, EST AFRICAN AMERICAN: 76 mL/min/{1.73_m2} (ref >59)
Globulin, Total: 2.2 g/dL (ref 1.5–4.5)
Glucose: 158 mg/dL — ABNORMAL HIGH (ref 65–99)
POTASSIUM: 4.1 mmol/L (ref 3.5–5.2)
Sodium: 138 mmol/L (ref 134–144)
Total Protein: 6.6 g/dL (ref 6.0–8.5)

## 2016-09-26 LAB — URIC ACID: URIC ACID: 6.2 mg/dL (ref 3.7–8.6)

## 2016-09-26 LAB — LACTATE DEHYDROGENASE: LDH: 181 IU/L (ref 121–224)

## 2016-09-26 NOTE — Telephone Encounter (Addendum)
Pt called / informed "white count up from 14,000 to 24,000, slight further decrease in platelet count. No anemia. I am not concerned - this is expected pattern. Continue to watch. Repeat CBC again in 2 months. Blood sugar 158." per Dr Beryle Beams. Stated he has an appt 4/23 and will wait until then to get labs. Also he has My Chart now and will start looking at his results.

## 2016-09-26 NOTE — Telephone Encounter (Signed)
-----   Message from Annia Belt, MD sent at 09/26/2016  9:36 AM EST ----- Call pt: white count up from 14,000 to 24,000, slight further decrease in platelet count. No anemia. I am not concerned - this is expected pattern. Continue to watch. Repeat CBC again in 2 months.  Blood sugar 158.- known diabetic on glucophage

## 2016-10-21 DIAGNOSIS — E119 Type 2 diabetes mellitus without complications: Secondary | ICD-10-CM | POA: Diagnosis not present

## 2016-10-21 DIAGNOSIS — H2513 Age-related nuclear cataract, bilateral: Secondary | ICD-10-CM | POA: Diagnosis not present

## 2016-10-21 DIAGNOSIS — H35033 Hypertensive retinopathy, bilateral: Secondary | ICD-10-CM | POA: Diagnosis not present

## 2016-11-06 ENCOUNTER — Encounter: Payer: Self-pay | Admitting: Oncology

## 2016-11-06 ENCOUNTER — Other Ambulatory Visit: Payer: Self-pay | Admitting: Oncology

## 2016-11-06 DIAGNOSIS — C9110 Chronic lymphocytic leukemia of B-cell type not having achieved remission: Secondary | ICD-10-CM

## 2016-11-06 NOTE — Progress Notes (Signed)
Phone conversation with patient and his daughter on March 56, [id number].  83 year old man recently diagnosed with stage II chronic lymphocytic leukemia.  He has not required treatment yet.  White count slowly rising and was 14,000 in December 2017 and 24,000 and February 2018.  He has splenomegaly by exam confirmed on CT scan. He lives with his daughter.  She was concerned with new symptoms of increased sweating.  Patient states this is primarily in his groin area which is quite unusual.  It is been going on for about 2 weeks.  Also affects the trunk.  He does wear a T-shirt to bed.  It has been damp but not really soaked except perhaps for 1 night.  He notices this most in the morning when he wakes up.  He otherwise feels quite well.  He has not checked his temperature.  He is eating well and gaining weight. I told the patient and his daughter I did not feel that there were any red flag signs here.  We will continue to monitor him clinically and I will move up his lab and MD visit to mid April.

## 2016-11-13 DIAGNOSIS — Z1283 Encounter for screening for malignant neoplasm of skin: Secondary | ICD-10-CM | POA: Diagnosis not present

## 2016-11-13 DIAGNOSIS — Z85820 Personal history of malignant melanoma of skin: Secondary | ICD-10-CM | POA: Diagnosis not present

## 2016-11-13 DIAGNOSIS — X32XXXD Exposure to sunlight, subsequent encounter: Secondary | ICD-10-CM | POA: Diagnosis not present

## 2016-11-13 DIAGNOSIS — Z08 Encounter for follow-up examination after completed treatment for malignant neoplasm: Secondary | ICD-10-CM | POA: Diagnosis not present

## 2016-11-13 DIAGNOSIS — L570 Actinic keratosis: Secondary | ICD-10-CM | POA: Diagnosis not present

## 2016-11-14 ENCOUNTER — Telehealth: Payer: Self-pay | Admitting: General Practice

## 2016-11-14 NOTE — Telephone Encounter (Signed)
APT. REMINDER CALL, LMTCB °

## 2016-11-18 ENCOUNTER — Other Ambulatory Visit (INDEPENDENT_AMBULATORY_CARE_PROVIDER_SITE_OTHER): Payer: PPO

## 2016-11-18 DIAGNOSIS — R161 Splenomegaly, not elsewhere classified: Secondary | ICD-10-CM | POA: Diagnosis not present

## 2016-11-18 DIAGNOSIS — C9110 Chronic lymphocytic leukemia of B-cell type not having achieved remission: Secondary | ICD-10-CM | POA: Diagnosis not present

## 2016-11-18 DIAGNOSIS — D696 Thrombocytopenia, unspecified: Secondary | ICD-10-CM

## 2016-11-19 LAB — COMPREHENSIVE METABOLIC PANEL
A/G RATIO: 2.1 (ref 1.2–2.2)
ALBUMIN: 4.1 g/dL (ref 3.5–4.7)
ALT: 15 IU/L (ref 0–44)
AST: 19 IU/L (ref 0–40)
Alkaline Phosphatase: 86 IU/L (ref 39–117)
BUN / CREAT RATIO: 13 (ref 10–24)
BUN: 15 mg/dL (ref 8–27)
Bilirubin Total: 0.4 mg/dL (ref 0.0–1.2)
CO2: 29 mmol/L (ref 18–29)
Calcium: 8.9 mg/dL (ref 8.6–10.2)
Chloride: 95 mmol/L — ABNORMAL LOW (ref 96–106)
Creatinine, Ser: 1.15 mg/dL (ref 0.76–1.27)
GFR calc non Af Amer: 57 mL/min/{1.73_m2} — ABNORMAL LOW (ref >59)
GFR, EST AFRICAN AMERICAN: 66 mL/min/{1.73_m2} (ref >59)
GLOBULIN, TOTAL: 2 g/dL (ref 1.5–4.5)
Glucose: 194 mg/dL — ABNORMAL HIGH (ref 65–99)
POTASSIUM: 4.2 mmol/L (ref 3.5–5.2)
SODIUM: 136 mmol/L (ref 134–144)
TOTAL PROTEIN: 6.1 g/dL (ref 6.0–8.5)

## 2016-11-19 LAB — CBC WITH DIFFERENTIAL/PLATELET
Basophils Absolute: 0.1 10*3/uL (ref 0.0–0.2)
Basos: 0 %
EOS (ABSOLUTE): 0.4 10*3/uL (ref 0.0–0.4)
Eos: 1 %
HEMOGLOBIN: 13.8 g/dL (ref 13.0–17.7)
Hematocrit: 41.8 % (ref 37.5–51.0)
Immature Grans (Abs): 0 10*3/uL (ref 0.0–0.1)
Immature Granulocytes: 0 %
LYMPHS ABS: 25.5 10*3/uL — AB (ref 0.7–3.1)
Lymphs: 87 %
MCH: 30.2 pg (ref 26.6–33.0)
MCHC: 33 g/dL (ref 31.5–35.7)
MCV: 92 fL (ref 79–97)
MONOCYTES: 2 %
MONOS ABS: 0.6 10*3/uL (ref 0.1–0.9)
NEUTROS ABS: 2.8 10*3/uL (ref 1.4–7.0)
Neutrophils: 10 %
Platelets: 89 10*3/uL — CL (ref 150–379)
RBC: 4.57 x10E6/uL (ref 4.14–5.80)
RDW: 14.2 % (ref 12.3–15.4)
WBC: 29.3 10*3/uL (ref 3.4–10.8)

## 2016-11-19 LAB — URIC ACID: URIC ACID: 7.3 mg/dL (ref 3.7–8.6)

## 2016-11-19 LAB — LACTATE DEHYDROGENASE: LDH: 138 IU/L (ref 121–224)

## 2016-11-20 ENCOUNTER — Telehealth: Payer: Self-pay | Admitting: *Deleted

## 2016-11-20 DIAGNOSIS — E11628 Type 2 diabetes mellitus with other skin complications: Secondary | ICD-10-CM | POA: Diagnosis not present

## 2016-11-20 DIAGNOSIS — C9190 Lymphoid leukemia, unspecified not having achieved remission: Secondary | ICD-10-CM | POA: Diagnosis not present

## 2016-11-20 DIAGNOSIS — I10 Essential (primary) hypertension: Secondary | ICD-10-CM | POA: Diagnosis not present

## 2016-11-20 DIAGNOSIS — D696 Thrombocytopenia, unspecified: Secondary | ICD-10-CM | POA: Diagnosis not present

## 2016-11-20 NOTE — Telephone Encounter (Signed)
Pt called / informed "white count up slightly and platelet count down compared with 1 month ago. Red count stable. Blood sugar up to 194. Will discuss at time of visit but still no plan to start treatment for leukemia yet. We will forward to primary care. Blood sugar meds may need to be adjusted. " per Dr Leone Haven he view labs on My Chart last night and thanks for calling and will see [place] on the 17th.

## 2016-11-20 NOTE — Telephone Encounter (Signed)
-----   Message from Annia Belt, MD sent at 11/19/2016  8:06 AM EDT ----- Call pt: white count up slightly and platelet count down compared with 1 month ago. Red count stable. Blood sugar up to 194. Will discuss at time of visit but still no plan to start treatment for leukemia yet. We will forward to primary care. Blood sugar meds may need to be adjusted. DrG

## 2016-11-25 DIAGNOSIS — H02135 Senile ectropion of left lower eyelid: Secondary | ICD-10-CM | POA: Diagnosis not present

## 2016-11-25 DIAGNOSIS — H11432 Conjunctival hyperemia, left eye: Secondary | ICD-10-CM | POA: Diagnosis not present

## 2016-11-25 DIAGNOSIS — H02132 Senile ectropion of right lower eyelid: Secondary | ICD-10-CM | POA: Diagnosis not present

## 2016-12-03 ENCOUNTER — Ambulatory Visit (INDEPENDENT_AMBULATORY_CARE_PROVIDER_SITE_OTHER): Payer: PPO | Admitting: Oncology

## 2016-12-03 ENCOUNTER — Encounter: Payer: Self-pay | Admitting: Oncology

## 2016-12-03 VITALS — BP 142/71 | HR 70 | Temp 97.6°F | Ht 71.0 in | Wt 191.4 lb

## 2016-12-03 DIAGNOSIS — D696 Thrombocytopenia, unspecified: Secondary | ICD-10-CM | POA: Diagnosis not present

## 2016-12-03 DIAGNOSIS — C9110 Chronic lymphocytic leukemia of B-cell type not having achieved remission: Secondary | ICD-10-CM

## 2016-12-03 DIAGNOSIS — R161 Splenomegaly, not elsewhere classified: Secondary | ICD-10-CM

## 2016-12-03 DIAGNOSIS — Z87891 Personal history of nicotine dependence: Secondary | ICD-10-CM

## 2016-12-03 DIAGNOSIS — R911 Solitary pulmonary nodule: Secondary | ICD-10-CM | POA: Diagnosis not present

## 2016-12-03 NOTE — Progress Notes (Signed)
Hematology and Oncology Follow Up Visit  Thomas Zavala [id number] Nov 10, 1927 83 y.o. 12/03/2016 3:01 PM   Principle Diagnosis: Rai Stage II chronic leukocytic leukemia  Clinical summary: 83 year old man diagnosed with stage II chronic lymphocytic leukemia in December 2017 when he was found to have an elevated white blood count with absolute lymphocytosis in December 2017.  Subsequent CBCs showed mild thrombocytopenia.  Physical exam remarkable for the absence of lymphadenopathy but the presence of splenomegaly with spleen palpable 5 cm below left costal margin.  Flow cytometry on peripheral blood confirmed a monoclonal population of B cells.  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  CT scan of the abdomen done September 03, 2016 to look for liver pathology showed a 2 cm benign hemangioma in the liver which was otherwise normal.  Maximum spleen dimension 15.6 cm.  A 1.3 cm low attenuation lesion in the spleen.  A 4 mm nodule seen in the inferior aspect of the right middle lobe.  Minimally enlarged abdominal lymph nodes in the gastrohepatic ligament and porta hepatis maximum dimension 1.4 cm.  Mildly enlarged prostate gland.  Status post TURP.  Incidental colonic diverticulosis. He was asymptomatic.  He had prior vaccination with the zoster vaccine and with Pneumovax.  He has been followed with observation alone.  White count has slowly drifted up from December value of 12,002 most recent value of 29,000 with 87% lymphocytes, 10% neutrophils, recorded on November 18, 2016.  Hemoglobin 14, hematocrit 42, platelet count 89,000.    Interim History: He is now living with his daughter who runs a local winery.  Overall he feels very well.  Appetite is good.  Weight is up 3 pounds compared with December weight.  He called on March 21 to report increased sweating.  Sweating occurs primarily in the medial thighs at night.  No drenching night  sweats.  No fevers although he did not take his temperature.  He remains active.  He was out chopping wood earlier today.  (Not bad for an 83 year old).  I did not think that his sweating has anything to do with his leukemia.  I did bring him in early for a blood count which was minimally changed compared with previous. Only other new problem is sudden development of left esotropion.  He has seen an ophthalmologist and surgery is planned.  Medications: reviewed  Allergies: No Known Allergies  Review of Systems: See interim history Remaining ROS negative:   Physical Exam: Blood pressure (!) 142/71, pulse 70, temperature 97.6 F (36.4 C), temperature source Oral, height 5\' 11"  (1.803 m), weight 191 lb 6.4 oz (86.8 kg), SpO2 100 %. Wt Readings from Last 3 Encounters:  12/03/16 191 lb 6.4 oz (86.8 kg)  07/23/16 187 lb 9.6 oz (85.1 kg)     General appearance: [name] HENNT: New esotropion of left eye.  Pharynx no erythema, exudate, mass, or ulcer. No thyromegaly or thyroid nodules Lymph nodes: No cervical, supraclavicular, lymphadenopathy.  Single 2-3 cm lymph node palpable right axilla Breasts: Lungs: Clear to auscultation, resonant to percussion throughout Heart: Regular rhythm, no murmur, no gallop, no rub, no click, no edema Abdomen: Soft, nontender, normal bowel sounds, no mass, spleen 5 cm below left costal margin unchanged.  No hepatomegaly. Extremities: No edema, no calf tenderness Musculoskeletal: no joint deformities GU:  Vascular: Carotid pulses 2+, no bruits Neurologic: Alert, oriented, PERRLA, cranial nerves grossly normal, motor strength 5 over 5, reflexes 1+ symmetric,  upper body coordination normal, gait normal, Skin: No rash or ecchymosis  Lab Results: CBC W/Diff    Component Value Date/Time   WBC 29.3 (HH) 11/18/2016 1031   WBC 13.6 (H) 07/23/2016 1509   RBC 4.57 11/18/2016 1031   RBC 4.49 07/23/2016 1509   RBC 4.49 07/23/2016 1509   HGB  13.3 07/23/2016 1509   HCT 41.8 11/18/2016 1031   PLT 89 (LL) 11/18/2016 1031   MCV 92 11/18/2016 1031   MCH 30.2 11/18/2016 1031   MCH 29.6 07/23/2016 1509   MCHC 33.0 11/18/2016 1031   MCHC 33.1 07/23/2016 1509   RDW 14.2 11/18/2016 1031   LYMPHSABS 25.5 (H) 11/18/2016 1031   MONOABS 0.5 07/23/2016 1509   EOSABS 0.4 11/18/2016 1031   BASOSABS 0.1 11/18/2016 1031     Chemistry      Component Value Date/Time   NA 136 11/18/2016 1031   K 4.2 11/18/2016 1031   CL 95 (L) 11/18/2016 1031   CO2 29 11/18/2016 1031   BUN 15 11/18/2016 1031   CREATININE 1.15 11/18/2016 1031      Component Value Date/Time   CALCIUM 8.9 11/18/2016 1031   ALKPHOS 86 11/18/2016 1031   AST 19 11/18/2016 1031   ALT 15 11/18/2016 1031   BILITOT 0.4 11/18/2016 1031       Radiological Studies: No results found.  Impression:  #1.  Stage II CLL Always hard to know when there is splenomegaly had to interpret the thrombocytopenia.  In general, anemia proceeds thrombocytopenia if the thrombocytopenia is due to progressive marrow involvement.  Therefore, I still do not feel we have any indication to initiate treatment. I reviewed again with the patient today the multiple options that we have to treat CLL if we need to.  I also told him we need to fit the treatment to his particular circumstance, especially his advanced age.  Since there is no curative therapy at this point in time, a simple treatment such as oral Leukeran plus prednisone might suffice.  In the absence of any other contraindications, ibrutinib would also be a good choice. I will continue to monitor his clinical and lab status.  #2.  Indeterminate 4 mm right lung nodule. I will get an interim CT scan in July which will be a 6 month interval  #3.  BPH status post TURP  #4.  Type 2 diabetes on oral agent  #5.  Essential hypertension  CC: Patient Care Team:    Murriel Hopper, MD, [place]  Hematology-Oncology/Internal  Medicine     4/17/20183:01 PM

## 2016-12-03 NOTE — Patient Instructions (Signed)
Continue every 2 month lab checks - next due 01/20/17 Return visit with Dr Darnell Level in 4 months Call for any interim problems

## 2016-12-09 ENCOUNTER — Encounter: Payer: PPO | Admitting: Oncology

## 2016-12-30 DIAGNOSIS — H02125 Mechanical ectropion of left lower eyelid: Secondary | ICD-10-CM | POA: Diagnosis not present

## 2017-01-10 DIAGNOSIS — J101 Influenza due to other identified influenza virus with other respiratory manifestations: Secondary | ICD-10-CM | POA: Diagnosis not present

## 2017-01-10 DIAGNOSIS — R509 Fever, unspecified: Secondary | ICD-10-CM | POA: Diagnosis not present

## 2017-01-10 DIAGNOSIS — M545 Low back pain: Secondary | ICD-10-CM | POA: Diagnosis not present

## 2017-01-20 ENCOUNTER — Other Ambulatory Visit (INDEPENDENT_AMBULATORY_CARE_PROVIDER_SITE_OTHER): Payer: PPO

## 2017-01-20 DIAGNOSIS — C9110 Chronic lymphocytic leukemia of B-cell type not having achieved remission: Secondary | ICD-10-CM | POA: Diagnosis not present

## 2017-01-20 DIAGNOSIS — C9190 Lymphoid leukemia, unspecified not having achieved remission: Secondary | ICD-10-CM | POA: Diagnosis not present

## 2017-01-20 DIAGNOSIS — R161 Splenomegaly, not elsewhere classified: Secondary | ICD-10-CM | POA: Diagnosis not present

## 2017-01-20 DIAGNOSIS — D696 Thrombocytopenia, unspecified: Secondary | ICD-10-CM | POA: Diagnosis not present

## 2017-01-21 LAB — COMPREHENSIVE METABOLIC PANEL
A/G RATIO: 1.7 (ref 1.2–2.2)
ALT: 41 IU/L (ref 0–44)
AST: 28 IU/L (ref 0–40)
Albumin: 3.8 g/dL (ref 3.5–4.7)
Alkaline Phosphatase: 96 IU/L (ref 39–117)
BUN/Creatinine Ratio: 20 (ref 10–24)
BUN: 23 mg/dL (ref 8–27)
Bilirubin Total: 0.7 mg/dL (ref 0.0–1.2)
CALCIUM: 9 mg/dL (ref 8.6–10.2)
CHLORIDE: 100 mmol/L (ref 96–106)
CO2: 26 mmol/L (ref 18–29)
Creatinine, Ser: 1.16 mg/dL (ref 0.76–1.27)
GFR, EST AFRICAN AMERICAN: 66 mL/min/{1.73_m2} (ref >59)
GFR, EST NON AFRICAN AMERICAN: 57 mL/min/{1.73_m2} — AB (ref >59)
GLOBULIN, TOTAL: 2.2 g/dL (ref 1.5–4.5)
Glucose: 143 mg/dL — ABNORMAL HIGH (ref 65–99)
POTASSIUM: 3.8 mmol/L (ref 3.5–5.2)
Sodium: 140 mmol/L (ref 134–144)
TOTAL PROTEIN: 6 g/dL (ref 6.0–8.5)

## 2017-01-21 LAB — LACTATE DEHYDROGENASE: LDH: 222 IU/L (ref 121–224)

## 2017-01-21 LAB — CBC WITH DIFFERENTIAL/PLATELET
BASOS ABS: 0 10*3/uL (ref 0.0–0.2)
BASOS: 0 %
EOS (ABSOLUTE): 0.1 10*3/uL (ref 0.0–0.4)
Eos: 1 %
HEMATOCRIT: 38.9 % (ref 37.5–51.0)
Hemoglobin: 13.3 g/dL (ref 13.0–17.7)
IMMATURE GRANS (ABS): 0 10*3/uL (ref 0.0–0.1)
Immature Granulocytes: 0 %
Lymphocytes Absolute: 6.7 10*3/uL — ABNORMAL HIGH (ref 0.7–3.1)
Lymphs: 68 %
MCH: 31.4 pg (ref 26.6–33.0)
MCHC: 34.2 g/dL (ref 31.5–35.7)
MCV: 92 fL (ref 79–97)
Monocytes Absolute: 0.7 10*3/uL (ref 0.1–0.9)
Monocytes: 7 %
NEUTROS ABS: 2.4 10*3/uL (ref 1.4–7.0)
Neutrophils: 24 %
PLATELETS: 110 10*3/uL — AB (ref 150–379)
RBC: 4.23 x10E6/uL (ref 4.14–5.80)
RDW: 14.2 % (ref 12.3–15.4)
WBC: 9.8 10*3/uL (ref 3.4–10.8)

## 2017-01-21 LAB — URIC ACID: Uric Acid: 7.4 mg/dL (ref 3.7–8.6)

## 2017-01-23 DIAGNOSIS — H02125 Mechanical ectropion of left lower eyelid: Secondary | ICD-10-CM | POA: Diagnosis not present

## 2017-01-27 ENCOUNTER — Telehealth: Payer: Self-pay | Admitting: *Deleted

## 2017-01-27 NOTE — Telephone Encounter (Signed)
-----   Message from Annia Belt, MD sent at 01/23/2017  2:41 PM EDT ----- Call pl: White count good - currently in normal range; platelet count also improved - up from 89,000 to 110,000;  Blood sugar 143

## 2017-01-27 NOTE — Telephone Encounter (Signed)
Pt called / informed "White count good - currently in normal range; platelet count also improved - up from 89,000 to 110,000; Blood sugar 143." per Dr Beryle Beams. Stated he had viewed results on My Chart. Message sent to [place] for f/u appt.

## 2017-02-24 DIAGNOSIS — L570 Actinic keratosis: Secondary | ICD-10-CM | POA: Diagnosis not present

## 2017-02-24 DIAGNOSIS — X32XXXD Exposure to sunlight, subsequent encounter: Secondary | ICD-10-CM | POA: Diagnosis not present

## 2017-02-24 DIAGNOSIS — Z08 Encounter for follow-up examination after completed treatment for malignant neoplasm: Secondary | ICD-10-CM | POA: Diagnosis not present

## 2017-02-24 DIAGNOSIS — Z85820 Personal history of malignant melanoma of skin: Secondary | ICD-10-CM | POA: Diagnosis not present

## 2017-02-24 DIAGNOSIS — Z1283 Encounter for screening for malignant neoplasm of skin: Secondary | ICD-10-CM | POA: Diagnosis not present

## 2017-04-14 ENCOUNTER — Encounter: Payer: PPO | Admitting: Oncology

## 2017-04-15 ENCOUNTER — Encounter: Payer: PPO | Admitting: Oncology

## 2017-04-24 DIAGNOSIS — L570 Actinic keratosis: Secondary | ICD-10-CM | POA: Diagnosis not present

## 2017-04-24 DIAGNOSIS — X32XXXD Exposure to sunlight, subsequent encounter: Secondary | ICD-10-CM | POA: Diagnosis not present

## 2017-05-12 ENCOUNTER — Encounter: Payer: Self-pay | Admitting: Oncology

## 2017-05-12 ENCOUNTER — Ambulatory Visit (INDEPENDENT_AMBULATORY_CARE_PROVIDER_SITE_OTHER): Payer: PPO | Admitting: Oncology

## 2017-05-12 VITALS — BP 134/74 | HR 62 | Temp 98.2°F | Ht 71.0 in | Wt 189.0 lb

## 2017-05-12 DIAGNOSIS — Z9079 Acquired absence of other genital organ(s): Secondary | ICD-10-CM

## 2017-05-12 DIAGNOSIS — Z87891 Personal history of nicotine dependence: Secondary | ICD-10-CM

## 2017-05-12 DIAGNOSIS — D1803 Hemangioma of intra-abdominal structures: Secondary | ICD-10-CM | POA: Diagnosis not present

## 2017-05-12 DIAGNOSIS — E119 Type 2 diabetes mellitus without complications: Secondary | ICD-10-CM | POA: Diagnosis not present

## 2017-05-12 DIAGNOSIS — N400 Enlarged prostate without lower urinary tract symptoms: Secondary | ICD-10-CM

## 2017-05-12 DIAGNOSIS — K5730 Diverticulosis of large intestine without perforation or abscess without bleeding: Secondary | ICD-10-CM

## 2017-05-12 DIAGNOSIS — D696 Thrombocytopenia, unspecified: Secondary | ICD-10-CM | POA: Diagnosis not present

## 2017-05-12 DIAGNOSIS — R911 Solitary pulmonary nodule: Secondary | ICD-10-CM | POA: Diagnosis not present

## 2017-05-12 DIAGNOSIS — Z79899 Other long term (current) drug therapy: Secondary | ICD-10-CM

## 2017-05-12 DIAGNOSIS — R002 Palpitations: Secondary | ICD-10-CM | POA: Diagnosis not present

## 2017-05-12 DIAGNOSIS — Z7984 Long term (current) use of oral hypoglycemic drugs: Secondary | ICD-10-CM

## 2017-05-12 DIAGNOSIS — I10 Essential (primary) hypertension: Secondary | ICD-10-CM | POA: Diagnosis not present

## 2017-05-12 DIAGNOSIS — C9110 Chronic lymphocytic leukemia of B-cell type not having achieved remission: Secondary | ICD-10-CM | POA: Diagnosis not present

## 2017-05-12 DIAGNOSIS — D72820 Lymphocytosis (symptomatic): Secondary | ICD-10-CM | POA: Diagnosis not present

## 2017-05-12 DIAGNOSIS — R161 Splenomegaly, not elsewhere classified: Secondary | ICD-10-CM

## 2017-05-12 DIAGNOSIS — IMO0001 Reserved for inherently not codable concepts without codable children: Secondary | ICD-10-CM

## 2017-05-12 LAB — CBC WITH DIFFERENTIAL/PLATELET
Basophils Absolute: 0 10*3/uL (ref 0.0–0.1)
Basophils Relative: 0 %
Eosinophils Absolute: 0.4 10*3/uL (ref 0.0–0.7)
Eosinophils Relative: 1 %
HEMATOCRIT: 43.3 % (ref 39.0–52.0)
HEMOGLOBIN: 14.3 g/dL (ref 13.0–17.0)
LYMPHS PCT: 88 %
Lymphs Abs: 34.1 10*3/uL — ABNORMAL HIGH (ref 0.7–4.0)
MCH: 30 pg (ref 26.0–34.0)
MCHC: 33 g/dL (ref 30.0–36.0)
MCV: 91 fL (ref 78.0–100.0)
MONOS PCT: 2 %
Monocytes Absolute: 0.8 10*3/uL (ref 0.1–1.0)
Neutro Abs: 3.5 10*3/uL (ref 1.7–7.7)
Neutrophils Relative %: 9 %
Platelets: 88 10*3/uL — ABNORMAL LOW (ref 150–400)
RBC: 4.76 MIL/uL (ref 4.22–5.81)
RDW: 14.2 % (ref 11.5–15.5)
WBC: 38.8 10*3/uL — ABNORMAL HIGH (ref 4.0–10.5)

## 2017-05-12 LAB — COMPREHENSIVE METABOLIC PANEL
ALBUMIN: 4 g/dL (ref 3.5–5.0)
ALK PHOS: 76 U/L (ref 38–126)
ALT: 13 U/L — AB (ref 17–63)
AST: 24 U/L (ref 15–41)
Anion gap: 9 (ref 5–15)
BUN: 15 mg/dL (ref 6–20)
CO2: 28 mmol/L (ref 22–32)
Calcium: 9.1 mg/dL (ref 8.9–10.3)
Chloride: 96 mmol/L — ABNORMAL LOW (ref 101–111)
Creatinine, Ser: 1.12 mg/dL (ref 0.61–1.24)
GFR calc Af Amer: >60 mL/min (ref >60)
GFR calc non Af Amer: 57 mL/min — ABNORMAL LOW (ref >60)
GLUCOSE: 115 mg/dL — AB (ref 65–99)
Potassium: 4.4 mmol/L (ref 3.5–5.1)
SODIUM: 133 mmol/L — AB (ref 135–145)
TOTAL PROTEIN: 6.5 g/dL (ref 6.5–8.1)
Total Bilirubin: 0.9 mg/dL (ref 0.3–1.2)

## 2017-05-12 LAB — LACTATE DEHYDROGENASE: LDH: 157 U/L (ref 98–192)

## 2017-05-12 NOTE — Patient Instructions (Signed)
Lab on 11/19 and again on 09/08/17 MD visit 1 week after lab in January

## 2017-05-12 NOTE — Progress Notes (Signed)
Hematology and Oncology Follow Up Visit  Thomas Zavala [id number] Sep 05, 1927 83 y.o. 05/12/2017 4:28 PM   Principle Diagnosis: Encounter Diagnoses  Name Primary?  . Splenomegaly   . CLL (chronic lymphocytic leukemia) ([place])   . Thrombocytopenia ([place])   . Heart palpitations   Clinical summary: 83 year old man diagnosed with stage II chronic lymphocytic leukemia in December 2017 when he was found to have an elevated white blood count with absolute lymphocytosis in December 2017.  Subsequent CBCs showed mild thrombocytopenia.  Physical exam remarkable initially for the absence of lymphadenopathy but the presence of splenomegaly with spleen palpable 5 cm below left costal margin.  Flow cytometry on peripheral blood confirmed a monoclonal population of B cells.  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  CT scan of the abdomen done September 03, 2016 to look for liver pathology showed a 2 cm benign hemangioma in the liver which was otherwise normal.  Maximum spleen dimension 15.6 cm.  A 1.3 cm low attenuation lesion in the spleen.  A 4 mm nodule seen in the inferior aspect of the right middle lobe.  Minimally enlarged abdominal lymph nodes in the gastrohepatic ligament and porta hepatis maximum dimension 1.4 cm.  Mildly enlarged prostate gland.  Status post TURP.  Incidental colonic diverticulosis. He was asymptomatic.  He had prior vaccination with the zoster vaccine and with Pneumovax.  He has been followed with observation alone.  White count has slowly drifted up and he has developed multiple small lymph nodes in the neck and axillae.  Interim History:   Since last visit here in April he did have a self-limited viral illness with sudden onset of flulike symptoms and fever. He was seen by his local primary care physician. Interestingly enough, CBC showed a fall not a rise in his white count with white count 9800 on June 4  consistent with viral suppression of his white cell production. He has noted small lymph nodes bilaterally in the preauricular regions. On my exam in April he had also developed small lymph nodes in his neck and he is keeping close track of these. Performance status remains excellent. No constitutional symptoms. He has been getting intermittent palpitations. This is a chronic problem. They are not more severe or more frequent. He was evaluated for this when he was living in [place] last year. He saw a cardiologist. He was put on metoprolol 50 mg 3 times a day which she is still taking. He would like a cardiology referral. He denies any chest pain or pressure.  Medications: reviewed  Allergies: No Known Allergies  Review of Systems: See interim history Remaining ROS negative:   Physical Exam: Blood pressure 134/74, pulse 62, temperature 98.2 F (36.8 C), temperature source Oral, height 5\' 11"  (1.803 m), weight 189 lb (85.7 kg), SpO2 100 %. Wt Readings from Last 3 Encounters:  05/12/17 189 lb (85.7 kg)  12/03/16 191 lb 6.4 oz (86.8 kg)  07/23/16 187 lb 9.6 oz (85.1 kg)     General appearance: Elderly Caucasian man HENNT: Pharynx no erythema, exudate, mass, or ulcer. No thyromegaly or thyroid nodules Lymph nodes: Small 1-2 centimeters nodes posterior cervical bilaterally, bilateral preauricular nodes less than 1 cm, left supraclavicular, bilateral axillary nodes right greater than left all less than or equal to 2 cm. Breasts:  Lungs: Clear to auscultation, resonant to percussion throughout Heart: Regular rhythm, no murmur, no gallop, no rub, no click, no edema Abdomen: Soft, nontender,  normal bowel sounds, no mass, spleen remains enlarged about 5 cm below left costal margin with a firm border unchanged from prior exams. Extremities: No edema, no calf tenderness Musculoskeletal: no joint deformities GU:  Vascular: Carotid pulses 2+, no bruits,  Neurologic: Alert, oriented, PERRLA,   cranial nerves grossly normal, motor strength 5 over 5, reflexes 1+ symmetric, upper body coordination normal, gait normal, Skin: No rash or ecchymosis  Lab Results: CBC W/Diff    Component Value Date/Time   WBC 38.8 (H) 05/12/2017 1435   RBC 4.76 05/12/2017 1435   HGB 14.3 05/12/2017 1435   HGB 13.3 01/20/2017 0945   HCT 43.3 05/12/2017 1435   HCT 38.9 01/20/2017 0945   PLT 88 (L) 05/12/2017 1435   PLT 110 (L) 01/20/2017 0945   MCV 91.0 05/12/2017 1435   MCV 92 01/20/2017 0945   MCH 30.0 05/12/2017 1435   MCHC 33.0 05/12/2017 1435   RDW 14.2 05/12/2017 1435   RDW 14.2 01/20/2017 0945   LYMPHSABS 34.1 (H) 05/12/2017 1435   LYMPHSABS 6.7 (H) 01/20/2017 0945   MONOABS 0.8 05/12/2017 1435   EOSABS 0.4 05/12/2017 1435   EOSABS 0.1 01/20/2017 0945   BASOSABS 0.0 05/12/2017 1435   BASOSABS 0.0 01/20/2017 0945     Chemistry      Component Value Date/Time   NA 133 (L) 05/12/2017 1435   NA 140 01/20/2017 0945   K 4.4 05/12/2017 1435   CL 96 (L) 05/12/2017 1435   CO2 28 05/12/2017 1435   BUN 15 05/12/2017 1435   BUN 23 01/20/2017 0945   CREATININE 1.12 05/12/2017 1435      Component Value Date/Time   CALCIUM 9.1 05/12/2017 1435   ALKPHOS 76 05/12/2017 1435   AST 24 05/12/2017 1435   ALT 13 (L) 05/12/2017 1435   BILITOT 0.9 05/12/2017 1435   BILITOT 0.7 01/20/2017 0945       Radiological Studies: No results found.  Impression:  #1.  Stage II CLL Progression of lymphadenopathy but all palpable nodes remain less than 2 cm. White count slowly rising, currently 39,000. Platelet count fluctuating current value 88,000 with range up to 127,000. Hemoglobin remained stable and normal. There is still no indication to initiate treatment. We again reviewed the fact that if he does need treatment, we have a number of options. A new drug recently approved for relapse is working so well it is now being looked at as part of initial therapy: Venetoclax: a bcl2 inhibitor, a pro-apoptotic  drug,  showing very high responses including deep bone marrow responses never seen before with any other agent especially when combined either with Rituxan or Ibrutinib. I will continue to monitor his clinical and lab status.  #2.  Indeterminate 4 mm right lung nodule. I will get an interim CT scan at this time which will be a 6 month interval  #3.  BPH status post TURP  #4.  Type 2 diabetes on oral agent  #5.  Essential hypertension  #6. Palpitations He would like to get established with a cardiologist here in [place]. I'm making a referral to Dr. Christen Butter.  CC: Patient Care Team: Jettie Booze, NP as PCP - General (Family Medicine)   Murriel Hopper, MD, [place]  Hematology-Oncology/Internal Medicine     9/24/20184:28 PM

## 2017-05-13 LAB — PATHOLOGIST SMEAR REVIEW

## 2017-05-22 ENCOUNTER — Other Ambulatory Visit: Payer: Self-pay | Admitting: Oncology

## 2017-05-22 DIAGNOSIS — R739 Hyperglycemia, unspecified: Secondary | ICD-10-CM | POA: Diagnosis not present

## 2017-05-22 DIAGNOSIS — Z23 Encounter for immunization: Secondary | ICD-10-CM | POA: Diagnosis not present

## 2017-05-22 DIAGNOSIS — Z0000 Encounter for general adult medical examination without abnormal findings: Secondary | ICD-10-CM | POA: Diagnosis not present

## 2017-05-27 ENCOUNTER — Ambulatory Visit (HOSPITAL_COMMUNITY)
Admission: RE | Admit: 2017-05-27 | Discharge: 2017-05-27 | Disposition: A | Discharge status: Home / Self Care | Payer: PPO | Source: Ambulatory Visit | Attending: Oncology | Admitting: Oncology

## 2017-05-27 DIAGNOSIS — R911 Solitary pulmonary nodule: Secondary | ICD-10-CM | POA: Diagnosis not present

## 2017-05-27 DIAGNOSIS — J439 Emphysema, unspecified: Secondary | ICD-10-CM | POA: Insufficient documentation

## 2017-05-27 DIAGNOSIS — IMO0001 Reserved for inherently not codable concepts without codable children: Secondary | ICD-10-CM

## 2017-05-27 DIAGNOSIS — I2510 Atherosclerotic heart disease of native coronary artery without angina pectoris: Secondary | ICD-10-CM | POA: Insufficient documentation

## 2017-05-27 DIAGNOSIS — I700 Atherosclerosis of aorta: Secondary | ICD-10-CM | POA: Insufficient documentation

## 2017-05-27 IMAGING — CT CT CHEST NODULE FOLLOW UP LOW DOSE W/O CM
2 of 4 series · 15 of 36 positions shown, 18 images · non-contrast
Comparison: CT abdomen pelvis [DATE].

CLINICAL DATA: Lung nodule.  CLL.

EXAM:
CT CHEST WITHOUT CONTRAST
TECHNIQUE: Multidetector CT imaging of the chest was performed following the
standard protocol without IV contrast.

[Series 4: l/d nodule f/u 2.0 lung · axial · 0.89mm/px · z∈[+1153,+1491]mm · 12 of 189 slices shown, 15 images]
[im 10/189  mediastinal]
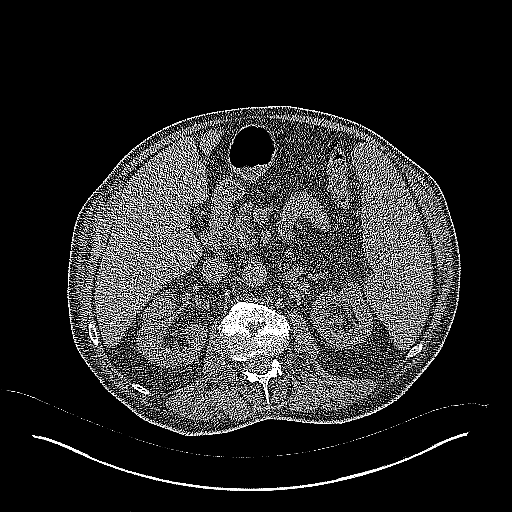
[im 10/189  lung]
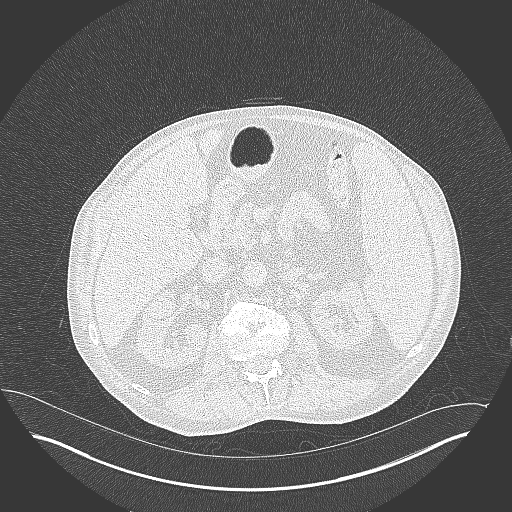
[im 30/189  lung]
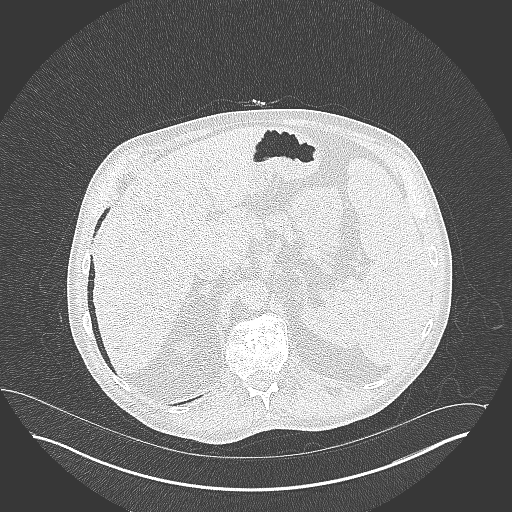
[im 40/189  lung]
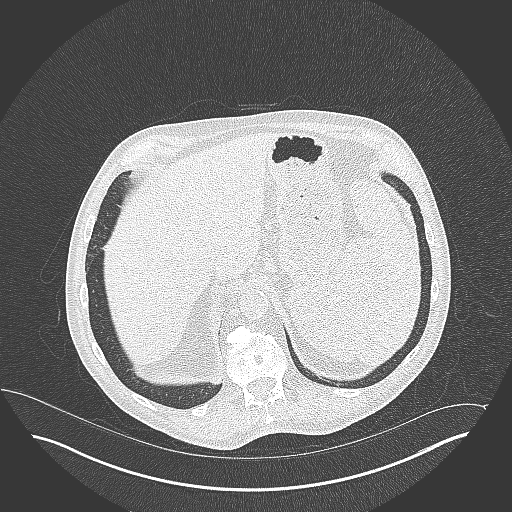
[im 60/189  lung]
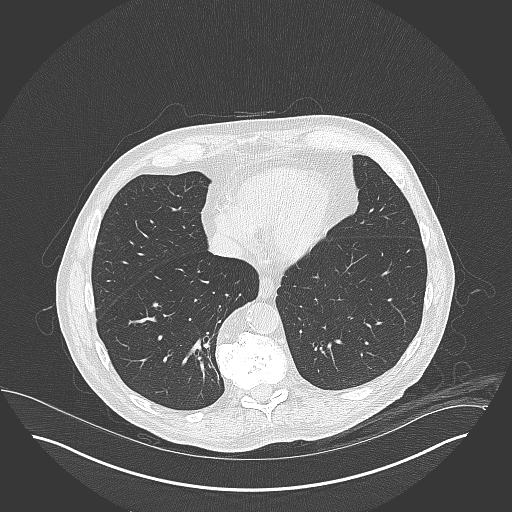
[im 70/189  mediastinal]
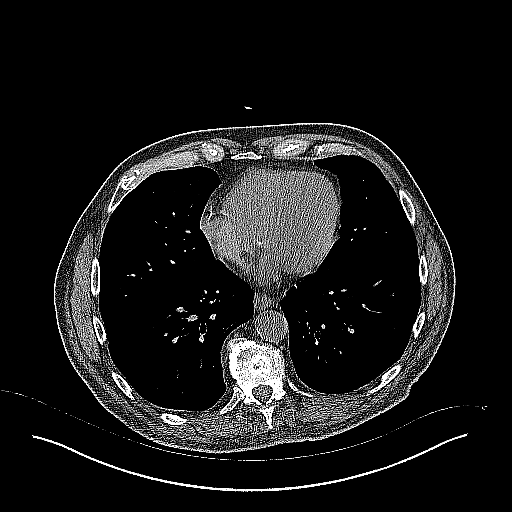
[im 70/189  lung]
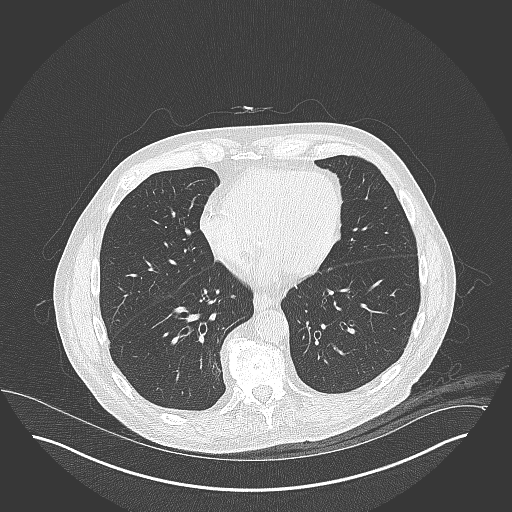
[im 90/189  lung]
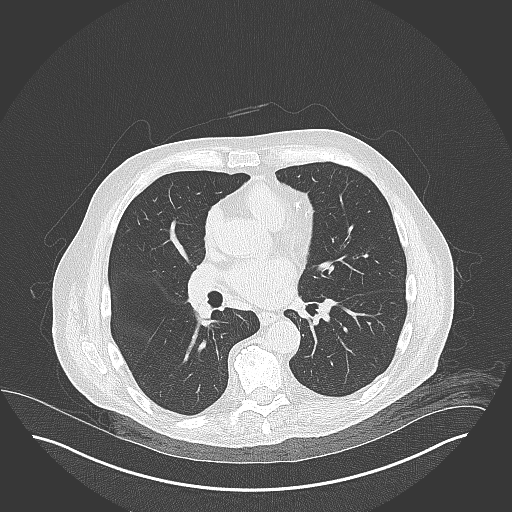
[im 99/189  lung]
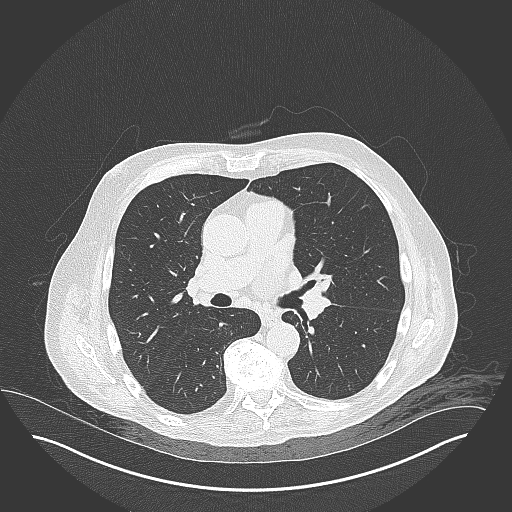
[im 119/189  lung]
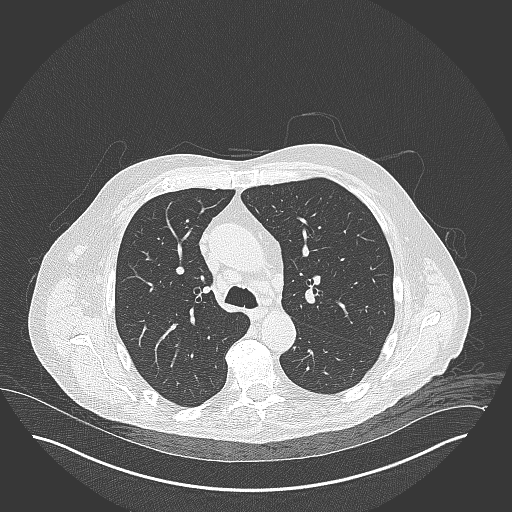
[im 129/189  mediastinal]
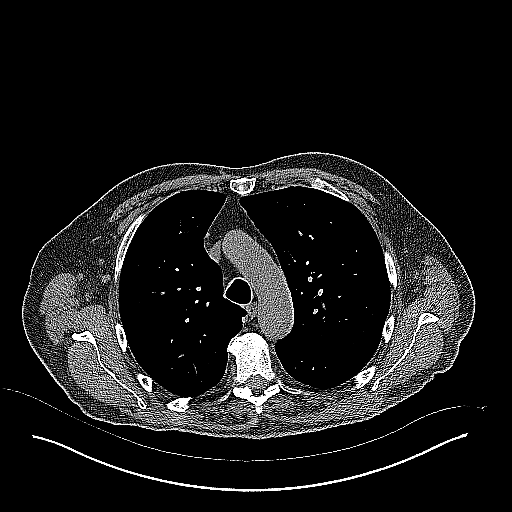
[im 129/189  lung]
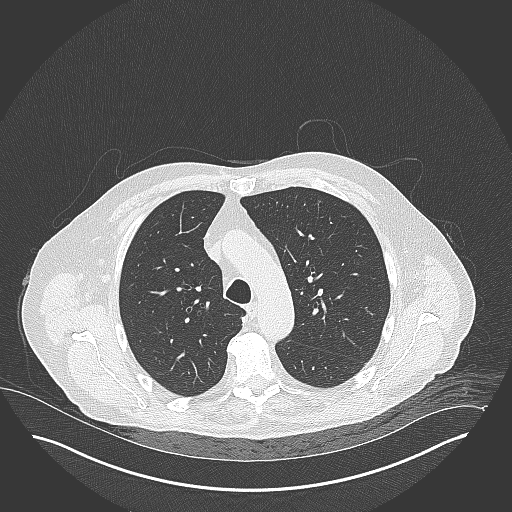
[im 149/189  lung]
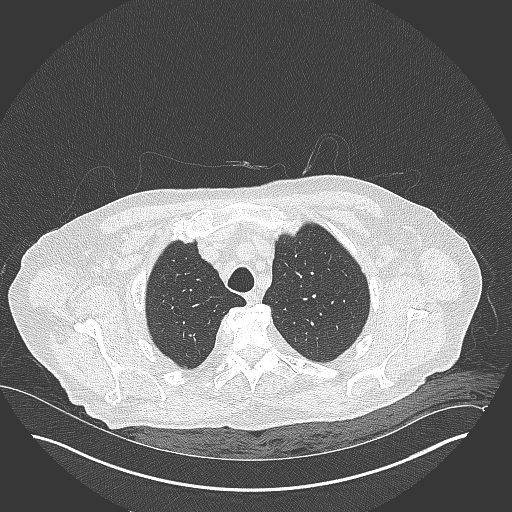
[im 159/189  lung]
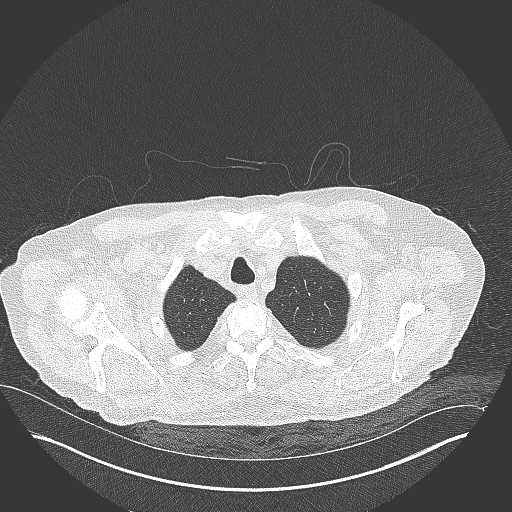
[im 179/189  lung]
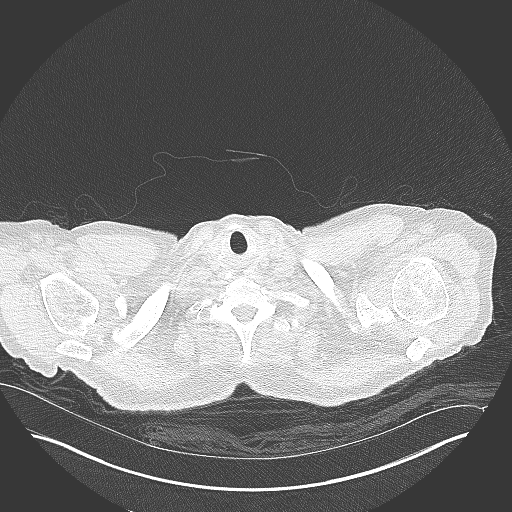

[Series 5: l/d nodule f/u 2.0 cor · coronal · 0.74mm/px · 3 of 159 slices shown]
[im 32/159  lung]
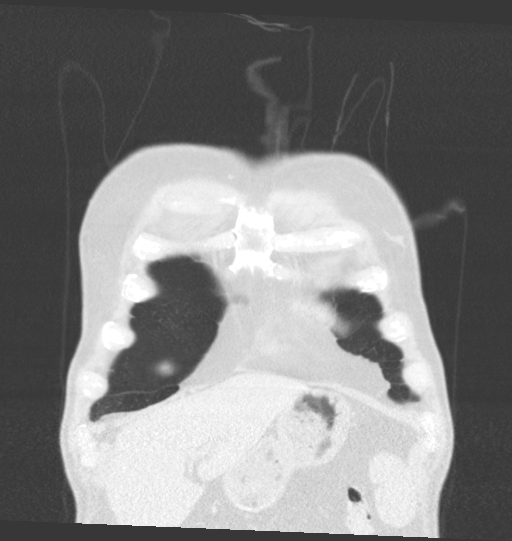
[im 64/159  lung]
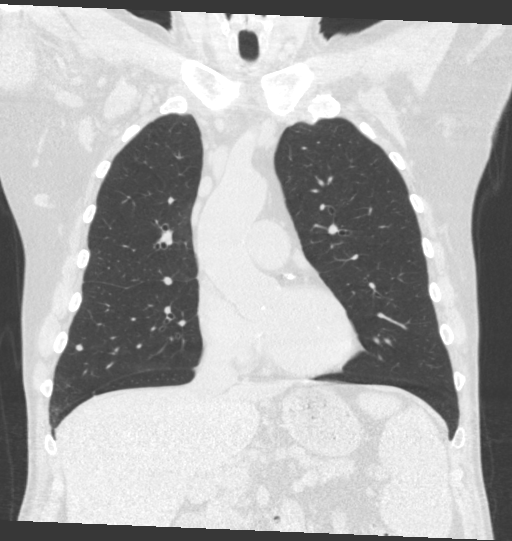
[im 95/159  lung]
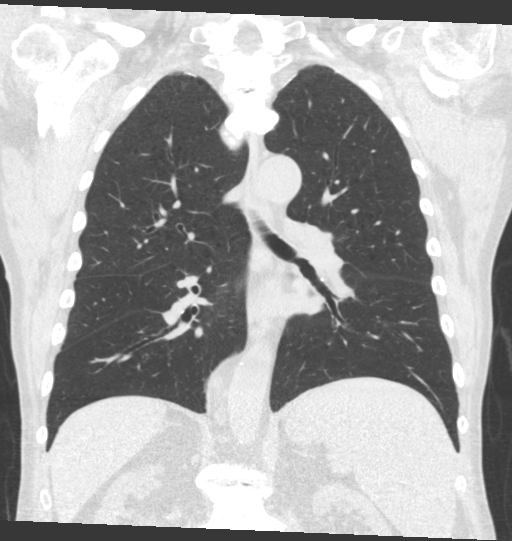

[15 of 36 positions shown; findings below may reference images not displayed]

FINDINGS: Cardiovascular: Atherosclerotic calcification of the arterial
vasculature, including moderate involvement of the coronary
arteries. Heart size normal. No pericardial effusion.

Mediastinum/Nodes: Mediastinal lymph nodes are increased in number
and measure up to 1.5 cm in the low right paratracheal station.
Hilar regions are difficult to evaluate without IV contrast.
Axillary lymph nodes are also increased in number, measuring up to
11 mm. Most of the larger lymph nodes do have a fatty hilum. Lymph
nodes adjacent to the descending thoracic aorta measure up to 9 mm,
previously the largest lymph node measured 5 mm.

Lungs/Pleura: Mild centrilobular emphysema. There are scattered
pulmonary nodules, the majority of which are subpleural in location.
The largest parenchymal nodule measures 6 mm in the right middle
lobe (series 4, image 126), likely stable given better visualization
and thinner slice collimation on the current exam. No pleural fluid.
Airway is unremarkable.

Upper Abdomen: Visualized portions of the liver, gallbladder,
adrenal glands and kidneys are unremarkable. Spleen measures at
least 13.6 cm but is incompletely imaged. Visualized portions of the
pancreas, stomach and bowel are grossly unremarkable. Porta hepatis
lymph nodes measure up to 1.5 cm. Gastrohepatic ligament lymph
nodes, up to 11 mm. These are stable from [DATE]. Visualize
retroperitoneal lymph nodes measure up to 8 mm in the left
periaortic station, stable.

Musculoskeletal: Degenerative changes in the spine. Flowing anterior
osteophytosis in the thoracic spine. Multiple Schmorl's nodes. No
worrisome lytic or sclerotic lesions.
IMPRESSION: 1. 6 mm right middle lobe nodule is likely stable given better
visualization and thinner slice collimation on the current study.
Additional scattered millimetric pulmonary nodules are subpleural in
location and favored to be benign. Continued attention on followup
exams is warranted.
2. Mediastinal, axillary, periaortic and upper abdominal borderline
adenopathy, together with splenomegaly, consistent with the known
history of CLL.
3. Aortic atherosclerosis ([W1]-170.0). Moderate coronary artery
calcification.
4.  Emphysema ([W1]-[W1]).

## 2017-05-28 DIAGNOSIS — I10 Essential (primary) hypertension: Secondary | ICD-10-CM | POA: Diagnosis not present

## 2017-05-28 DIAGNOSIS — E119 Type 2 diabetes mellitus without complications: Secondary | ICD-10-CM | POA: Diagnosis not present

## 2017-05-28 DIAGNOSIS — I493 Ventricular premature depolarization: Secondary | ICD-10-CM | POA: Diagnosis not present

## 2017-05-28 DIAGNOSIS — C9190 Lymphoid leukemia, unspecified not having achieved remission: Secondary | ICD-10-CM | POA: Diagnosis not present

## 2017-05-29 ENCOUNTER — Telehealth: Payer: Self-pay | Admitting: *Deleted

## 2017-05-29 NOTE — Telephone Encounter (Signed)
-----   Message from Annia Belt, MD sent at 05/28/2017 10:17 AM EDT ----- Call pt: tiny spot on lung not growing. Will repeat scan in 6 months then if no change, no need to keep getting scans

## 2017-05-29 NOTE — Telephone Encounter (Signed)
Pt called / informed "tiny spot on lung not growing. Will repeat scan in 6 months then if no change, no need to keep getting scans" per DR Granfortuna. Stated he looked at result on My Chart and thanks for calling and interpretering.

## 2017-06-04 DIAGNOSIS — D225 Melanocytic nevi of trunk: Secondary | ICD-10-CM | POA: Diagnosis not present

## 2017-06-04 DIAGNOSIS — Z08 Encounter for follow-up examination after completed treatment for malignant neoplasm: Secondary | ICD-10-CM | POA: Diagnosis not present

## 2017-06-04 DIAGNOSIS — L821 Other seborrheic keratosis: Secondary | ICD-10-CM | POA: Diagnosis not present

## 2017-06-04 DIAGNOSIS — D1801 Hemangioma of skin and subcutaneous tissue: Secondary | ICD-10-CM | POA: Diagnosis not present

## 2017-06-04 DIAGNOSIS — Z85820 Personal history of malignant melanoma of skin: Secondary | ICD-10-CM | POA: Diagnosis not present

## 2017-06-04 DIAGNOSIS — L570 Actinic keratosis: Secondary | ICD-10-CM | POA: Diagnosis not present

## 2017-06-04 DIAGNOSIS — X32XXXD Exposure to sunlight, subsequent encounter: Secondary | ICD-10-CM | POA: Diagnosis not present

## 2017-06-04 DIAGNOSIS — D0439 Carcinoma in situ of skin of other parts of face: Secondary | ICD-10-CM | POA: Diagnosis not present

## 2017-07-07 ENCOUNTER — Other Ambulatory Visit (INDEPENDENT_AMBULATORY_CARE_PROVIDER_SITE_OTHER): Payer: PPO

## 2017-07-07 DIAGNOSIS — R161 Splenomegaly, not elsewhere classified: Secondary | ICD-10-CM | POA: Diagnosis not present

## 2017-07-07 DIAGNOSIS — D696 Thrombocytopenia, unspecified: Secondary | ICD-10-CM

## 2017-07-07 DIAGNOSIS — C9190 Lymphoid leukemia, unspecified not having achieved remission: Secondary | ICD-10-CM | POA: Diagnosis not present

## 2017-07-07 DIAGNOSIS — Z08 Encounter for follow-up examination after completed treatment for malignant neoplasm: Secondary | ICD-10-CM | POA: Diagnosis not present

## 2017-07-07 DIAGNOSIS — L218 Other seborrheic dermatitis: Secondary | ICD-10-CM | POA: Diagnosis not present

## 2017-07-07 DIAGNOSIS — Z85828 Personal history of other malignant neoplasm of skin: Secondary | ICD-10-CM | POA: Diagnosis not present

## 2017-07-07 DIAGNOSIS — C9110 Chronic lymphocytic leukemia of B-cell type not having achieved remission: Secondary | ICD-10-CM | POA: Diagnosis not present

## 2017-07-08 ENCOUNTER — Telehealth: Payer: Self-pay | Admitting: *Deleted

## 2017-07-08 LAB — CBC WITH DIFFERENTIAL/PLATELET
BASOS ABS: 0.1 10*3/uL (ref 0.0–0.2)
Basos: 0 %
EOS (ABSOLUTE): 0.2 10*3/uL (ref 0.0–0.4)
Eos: 1 %
Hematocrit: 40.9 % (ref 37.5–51.0)
Hemoglobin: 14.1 g/dL (ref 13.0–17.7)
IMMATURE GRANS (ABS): 0 10*3/uL (ref 0.0–0.1)
Immature Granulocytes: 0 %
LYMPHS: 90 %
Lymphocytes Absolute: 30.4 10*3/uL — ABNORMAL HIGH (ref 0.7–3.1)
MCH: 32 pg (ref 26.6–33.0)
MCHC: 34.5 g/dL (ref 31.5–35.7)
MCV: 93 fL (ref 79–97)
MONOCYTES: 0 %
Monocytes Absolute: 0.5 10*3/uL (ref 0.1–0.9)
NEUTROS ABS: 3.1 10*3/uL (ref 1.4–7.0)
Neutrophils: 9 %
PLATELETS: 92 10*3/uL — AB (ref 150–379)
RBC: 4.4 x10E6/uL (ref 4.14–5.80)
RDW: 14.3 % (ref 12.3–15.4)
WBC: 34.2 10*3/uL (ref 3.4–10.8)

## 2017-07-08 LAB — LACTATE DEHYDROGENASE: LDH: 154 IU/L (ref 121–224)

## 2017-07-08 NOTE — Telephone Encounter (Signed)
-----   Message from Annia Belt, MD sent at 07/08/2017 10:00 AM EST ----- Call pt: counts good - stable and a little better than last time. Happy Thanksgiving!

## 2017-07-08 NOTE — Telephone Encounter (Signed)
Pt called / informed "counts good - stable and a little better than last time. Happy Thanksgiving! " per Dr Beryle Beams. Stated thank-you for calling and Happy Thanksgiving.

## 2017-09-03 DIAGNOSIS — Z08 Encounter for follow-up examination after completed treatment for malignant neoplasm: Secondary | ICD-10-CM | POA: Diagnosis not present

## 2017-09-03 DIAGNOSIS — X32XXXD Exposure to sunlight, subsequent encounter: Secondary | ICD-10-CM | POA: Diagnosis not present

## 2017-09-03 DIAGNOSIS — L821 Other seborrheic keratosis: Secondary | ICD-10-CM | POA: Diagnosis not present

## 2017-09-03 DIAGNOSIS — Z1283 Encounter for screening for malignant neoplasm of skin: Secondary | ICD-10-CM | POA: Diagnosis not present

## 2017-09-03 DIAGNOSIS — Z85820 Personal history of malignant melanoma of skin: Secondary | ICD-10-CM | POA: Diagnosis not present

## 2017-09-03 DIAGNOSIS — L570 Actinic keratosis: Secondary | ICD-10-CM | POA: Diagnosis not present

## 2017-09-08 ENCOUNTER — Other Ambulatory Visit: Payer: PPO

## 2017-09-16 ENCOUNTER — Encounter: Payer: Self-pay | Admitting: Oncology

## 2017-09-16 ENCOUNTER — Other Ambulatory Visit: Payer: Self-pay

## 2017-09-16 ENCOUNTER — Ambulatory Visit (INDEPENDENT_AMBULATORY_CARE_PROVIDER_SITE_OTHER): Payer: PPO | Admitting: Oncology

## 2017-09-16 VITALS — BP 165/74 | HR 68 | Temp 97.4°F | Ht 71.0 in | Wt 185.3 lb

## 2017-09-16 DIAGNOSIS — C9110 Chronic lymphocytic leukemia of B-cell type not having achieved remission: Secondary | ICD-10-CM | POA: Diagnosis not present

## 2017-09-16 DIAGNOSIS — I10 Essential (primary) hypertension: Secondary | ICD-10-CM

## 2017-09-16 DIAGNOSIS — Z7984 Long term (current) use of oral hypoglycemic drugs: Secondary | ICD-10-CM | POA: Diagnosis not present

## 2017-09-16 DIAGNOSIS — E119 Type 2 diabetes mellitus without complications: Secondary | ICD-10-CM | POA: Diagnosis not present

## 2017-09-16 DIAGNOSIS — Z79899 Other long term (current) drug therapy: Secondary | ICD-10-CM | POA: Diagnosis not present

## 2017-09-16 DIAGNOSIS — N400 Enlarged prostate without lower urinary tract symptoms: Secondary | ICD-10-CM

## 2017-09-16 DIAGNOSIS — Z8679 Personal history of other diseases of the circulatory system: Secondary | ICD-10-CM

## 2017-09-16 DIAGNOSIS — Z87891 Personal history of nicotine dependence: Secondary | ICD-10-CM

## 2017-09-16 DIAGNOSIS — R161 Splenomegaly, not elsewhere classified: Secondary | ICD-10-CM | POA: Diagnosis not present

## 2017-09-16 DIAGNOSIS — D696 Thrombocytopenia, unspecified: Secondary | ICD-10-CM

## 2017-09-16 DIAGNOSIS — R911 Solitary pulmonary nodule: Secondary | ICD-10-CM | POA: Diagnosis not present

## 2017-09-16 DIAGNOSIS — D72820 Lymphocytosis (symptomatic): Secondary | ICD-10-CM | POA: Diagnosis not present

## 2017-09-16 LAB — COMPREHENSIVE METABOLIC PANEL
ALBUMIN: 4.2 g/dL (ref 3.5–5.0)
ALT: 13 U/L — AB (ref 17–63)
ANION GAP: 11 (ref 5–15)
AST: 29 U/L (ref 15–41)
Alkaline Phosphatase: 92 U/L (ref 38–126)
BUN: 12 mg/dL (ref 6–20)
CHLORIDE: 95 mmol/L — AB (ref 101–111)
CO2: 28 mmol/L (ref 22–32)
CREATININE: 1.15 mg/dL (ref 0.61–1.24)
Calcium: 9.3 mg/dL (ref 8.9–10.3)
GFR calc Af Amer: >60 mL/min (ref >60)
GFR calc non Af Amer: 55 mL/min — ABNORMAL LOW (ref >60)
GLUCOSE: 148 mg/dL — AB (ref 65–99)
Potassium: 4.8 mmol/L (ref 3.5–5.1)
SODIUM: 134 mmol/L — AB (ref 135–145)
Total Bilirubin: 1.2 mg/dL (ref 0.3–1.2)
Total Protein: 6.6 g/dL (ref 6.5–8.1)

## 2017-09-16 LAB — CBC WITH DIFFERENTIAL/PLATELET
BASOS ABS: 0 10*3/uL (ref 0.0–0.1)
Basophils Relative: 0 %
EOS ABS: 0 10*3/uL (ref 0.0–0.7)
Eosinophils Relative: 0 %
HCT: 45.5 % (ref 39.0–52.0)
Hemoglobin: 15.8 g/dL (ref 13.0–17.0)
LYMPHS PCT: 91 %
Lymphs Abs: 41.8 10*3/uL — ABNORMAL HIGH (ref 0.7–4.0)
MCH: 33.1 pg (ref 26.0–34.0)
MCHC: 34.7 g/dL (ref 30.0–36.0)
MCV: 95.4 fL (ref 78.0–100.0)
MONO ABS: 0.9 10*3/uL (ref 0.1–1.0)
Monocytes Relative: 2 %
NEUTROS PCT: 7 %
Neutro Abs: 3.2 10*3/uL (ref 1.7–7.7)
PLATELETS: 107 10*3/uL — AB (ref 150–400)
RBC: 4.77 MIL/uL (ref 4.22–5.81)
RDW: 13.6 % (ref 11.5–15.5)
WBC: 45.9 10*3/uL — AB (ref 4.0–10.5)

## 2017-09-16 LAB — LACTATE DEHYDROGENASE: LDH: 179 U/L (ref 98–192)

## 2017-09-16 NOTE — Progress Notes (Signed)
Hematology and Oncology Follow Up Visit  Thomas Zavala [id number] 1928-05-29 84 y.o. 09/16/2017 5:02 PM   Principle Diagnosis: Encounter Diagnoses  Name Primary?  . Splenomegaly   . CLL (chronic lymphocytic leukemia) ([place]) Yes  . Thrombocytopenia [facility])   Clinical summary: 84 year old man diagnosed with stage II chronic lymphocytic leukemia in December 2017 when he was found to have an elevated white blood count with absolute lymphocytosis in December 2017. Subsequent CBCsshowed mild thrombocytopenia. Physical exam remarkable initially for the absence of lymphadenopathy but the presence of splenomegaly with spleen palpable 5 cm below left costal margin. Flow cytometry on peripheral blood confirmed a monoclonal population of B cells. 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 CT scan of the abdomen done September 03, 2016 to look for liver pathology showed a 2 cm benign hemangioma in the liver which was otherwise normal. Maximum spleen dimension 15.6 cm. A 1.3 cm low attenuation lesion in the spleen. A 4 mm nodule seen in the inferior aspect of the right middle lobe. Minimally enlarged abdominal lymph nodes in the gastrohepatic ligament and porta hepatis maximum dimension 1.4 cm. Mildly enlarged prostate gland. Status post TURP. Incidental colonic diverticulosis. He was asymptomatic. He had prior vaccination with the zoster vaccine and with Pneumovax. He has been followed with observation alone. White count has slowly drifted up and he has developed multiple small lymph nodes in the neck and axillae.    Interim History:   He is doing well.  No interim medical problems.  He had some superficial skin tumors removed from his face and forehead recently.  Probably actinic keratoses.  No infections.  No cardiorespiratory complaints.  No GI complaints.  Medications: reviewed  Allergies: No Known Allergies  Review  of Systems: See interim history:  Remaining ROS negative:   Physical Exam: Blood pressure (!) 165/74, pulse 68, temperature (!) 97.4 F (36.3 C), temperature source Oral, height 5\' 11"  (1.803 m), weight 185 lb 4.8 oz (84.1 kg), SpO2 100 %. Wt Readings from Last 3 Encounters:  09/16/17 185 lb 4.8 oz (84.1 kg)  05/12/17 189 lb (85.7 kg)  12/03/16 191 lb 6.4 oz (86.8 kg)     General appearance: [name] HENNT: Pharynx no erythema, exudate, mass, or ulcer. No thyromegaly or thyroid nodules Lymph nodes: No cervical, supraclavicular, or right axillary lymphadenopathy.  Borderline 2 cm node palpable in the left axilla. Breasts:  Lungs: Clear to auscultation, resonant to percussion throughout Heart: Regular rhythm, no murmur, no gallop, no rub, no click, no edema Abdomen: Soft, nontender, normal bowel sounds, no mass, spleen 3 cm below left costal margin unchanged from prior exam, shotty bi-inguinal adenopathy no nodes greater than 2 cm. Extremities: No edema, no calf tenderness Musculoskeletal: no joint deformities GU:  Vascular: Carotid pulses 2+, no bruits,  Neurologic: Alert, oriented, bilateral esotropia, PERRLA, optic discs sharp and vessels normal, no hemorrhage or exudate, cranial nerves grossly normal, motor strength 5 over 5, reflexes 1+ symmetric, upper body coordination normal, gait normal, Skin: No rash or ecchymosis  Lab Results: CBC W/Diff    Component Value Date/Time   WBC 45.9 (H) 09/16/2017 1121   RBC 4.77 09/16/2017 1121   HGB 15.8 09/16/2017 1121   HGB 14.1 07/07/2017 1413   HCT 45.5 09/16/2017 1121   HCT 40.9 07/07/2017 1413   PLT 107 (L) 09/16/2017 1121   PLT 92 (LL) 07/07/2017 1413   MCV 95.4 09/16/2017 1121   MCV 93 07/07/2017  1413   MCH 33.1 09/16/2017 1121   MCHC 34.7 09/16/2017 1121   RDW 13.6 09/16/2017 1121   RDW 14.3 07/07/2017 1413   LYMPHSABS 41.8 (H) 09/16/2017 1121   LYMPHSABS 30.4 (H) 07/07/2017 1413   MONOABS 0.9  09/16/2017 1121   EOSABS 0.0 09/16/2017 1121   EOSABS 0.2 07/07/2017 1413   BASOSABS 0.0 09/16/2017 1121   BASOSABS 0.1 07/07/2017 1413     Chemistry      Component Value Date/Time   NA 134 (L) 09/16/2017 1121   NA 140 01/20/2017 0945   K 4.8 09/16/2017 1121   CL 95 (L) 09/16/2017 1121   CO2 28 09/16/2017 1121   BUN 12 09/16/2017 1121   BUN 23 01/20/2017 0945   CREATININE 1.15 09/16/2017 1121      Component Value Date/Time   CALCIUM 9.3 09/16/2017 1121   ALKPHOS 92 09/16/2017 1121   AST 29 09/16/2017 1121   ALT 13 (L) 09/16/2017 1121   BILITOT 1.2 09/16/2017 1121   BILITOT 0.7 01/20/2017 0945       Radiological Studies: No results found.  Impression:  1.Rai stage II, unmutated heavy chain immunoglobulin gene, trisomy 12 cytogenetics. White count slowly rising over time but hemoglobin remains normal and platelet count is stable at or above 100,000.  Patient is asymptomatic with no constitutional symptoms.  Still no indication to initiate treatment. If and when treatment is indicated, he has no underlying cardiac condition or arrhythmia and ibrutinib would be my first choice.  Recent updates at our national hematology meeting in December 2018 are also showing excellent responses with Venetoclax, another oral agent that restores apoptosis.  2.  Essential hypertension reasonably controlled on current regimen  3.  Type 2 diabetes on oral agent  4.  BPH status post TURP status post laser ablation  5.  4 mm right upper lobe pulmonary nodule I will get a follow-up CT scan at this time.  If the lesion is unchanged in no further follow-up will be necessary.  6.  History of intermittent palpitations He was evaluated by cardiology Dr. Einar Zavala since last visit with me.  His note has not yet been scanned into the epic record but as I recall, he found no obvious pathology and did not recommend an aggressive evaluation.  CC: Patient Care Team: Jettie Booze, NP as PCP - General  (Family Medicine)   Murriel Hopper, MD, FACP  Hematology-Oncology/Internal Medicine     1/29/20195:02 PM

## 2017-09-16 NOTE — Patient Instructions (Signed)
Lab in 3 months Lab in 6 months MD visit 1 week after lab in 6 months

## 2017-09-16 NOTE — Addendum Note (Signed)
Addended by: Truddie Crumble on: 09/16/2017 11:23 AM   Modules accepted: Orders

## 2017-09-17 ENCOUNTER — Encounter: Payer: Self-pay | Admitting: Oncology

## 2017-09-17 ENCOUNTER — Telehealth: Payer: Self-pay | Admitting: *Deleted

## 2017-09-17 LAB — PATHOLOGIST SMEAR REVIEW

## 2017-09-17 NOTE — Telephone Encounter (Signed)
-----   Message from Annia Belt, MD sent at 09/16/2017  1:16 PM EST ----- Call pt: white count higher but red count and platelets stable: no need to start any treatment at this time

## 2017-09-17 NOTE — Telephone Encounter (Signed)
Pt called / informed "white count higher but red count and platelets stable: no need to start any treatment at this time" per Dr Beryle Beams. Stated he views MY Chart last night and all the results were not back yet but check again today.

## 2017-10-10 ENCOUNTER — Encounter (HOSPITAL_COMMUNITY): Payer: Self-pay

## 2017-10-10 ENCOUNTER — Ambulatory Visit (HOSPITAL_COMMUNITY)
Admission: RE | Admit: 2017-10-10 | Discharge: 2017-10-10 | Disposition: A | Discharge status: Home / Self Care | Payer: PPO | Source: Ambulatory Visit | Attending: Oncology | Admitting: Oncology

## 2017-10-10 DIAGNOSIS — R918 Other nonspecific abnormal finding of lung field: Secondary | ICD-10-CM | POA: Diagnosis not present

## 2017-10-10 DIAGNOSIS — R590 Localized enlarged lymph nodes: Secondary | ICD-10-CM | POA: Diagnosis not present

## 2017-10-10 DIAGNOSIS — C9190 Lymphoid leukemia, unspecified not having achieved remission: Secondary | ICD-10-CM | POA: Diagnosis not present

## 2017-10-10 DIAGNOSIS — I700 Atherosclerosis of aorta: Secondary | ICD-10-CM | POA: Insufficient documentation

## 2017-10-10 DIAGNOSIS — D696 Thrombocytopenia, unspecified: Secondary | ICD-10-CM | POA: Diagnosis not present

## 2017-10-10 DIAGNOSIS — J439 Emphysema, unspecified: Secondary | ICD-10-CM | POA: Diagnosis not present

## 2017-10-10 DIAGNOSIS — C9110 Chronic lymphocytic leukemia of B-cell type not having achieved remission: Secondary | ICD-10-CM | POA: Diagnosis not present

## 2017-10-10 DIAGNOSIS — R161 Splenomegaly, not elsewhere classified: Secondary | ICD-10-CM

## 2017-10-10 IMAGING — CT CT CHEST W/ CM
2 of 3 series · 15 of 36 positions shown, 18 images · IV contrast (iopamidol)
Comparison: CT chest dated [DATE]. CT abdomen/pelvis dated
[DATE].

CLINICAL DATA: Follow-up pulmonary nodule.  CLL.

EXAM:
CT CHEST WITH CONTRAST
TECHNIQUE: Multidetector CT imaging of the chest was performed during
intravenous contrast administration.
CONTRAST:  75mL [L1] IOPAMIDOL ([L1]) INJECTION 61%

[Series 3: thorax 2.0 i31f 2 · axial · 0.86mm/px · z∈[+1308,+1616]mm · 12 of 182 slices shown, 15 images]
[im 14/182  mediastinal]
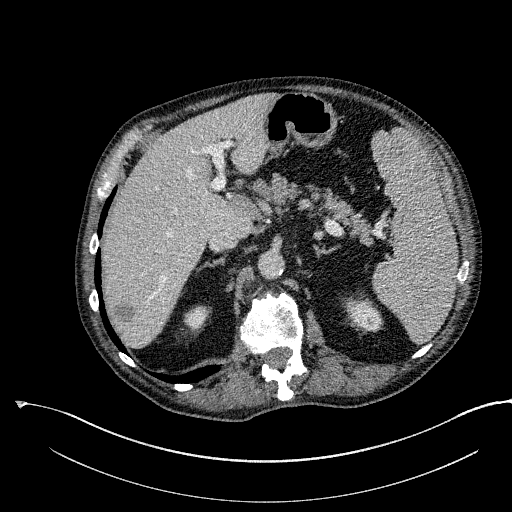
[im 14/182  lung]
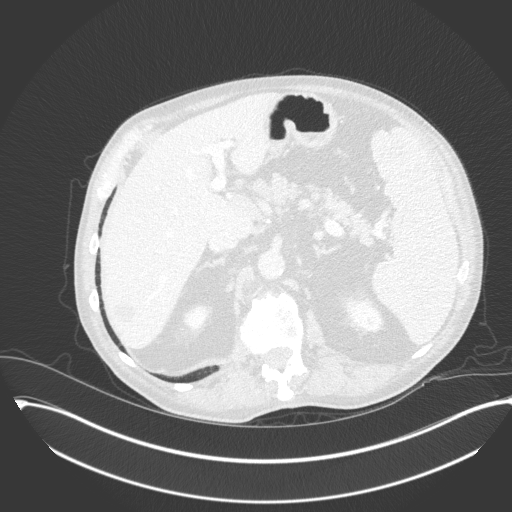
[im 27/182  lung]
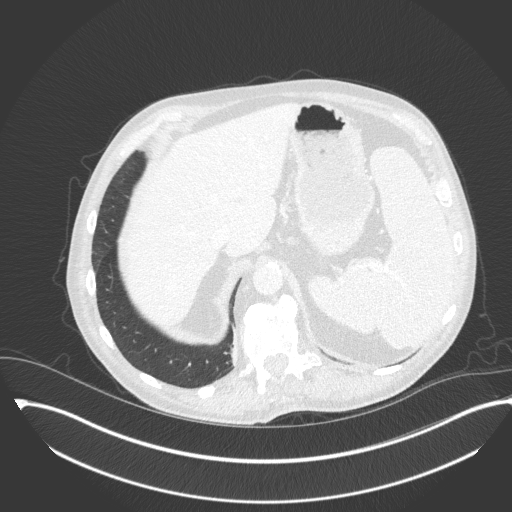
[im 41/182  lung]
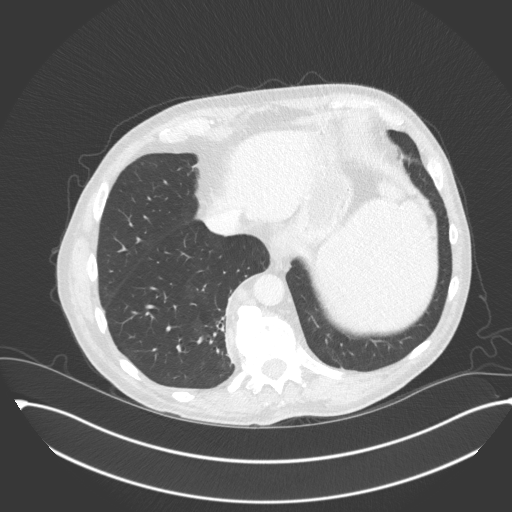
[im 54/182  lung]
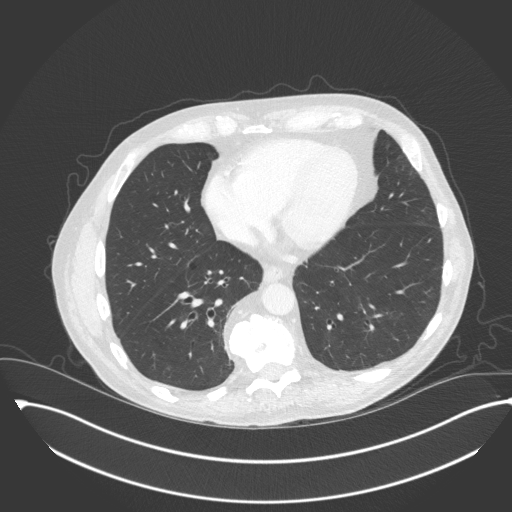
[im 68/182  mediastinal]
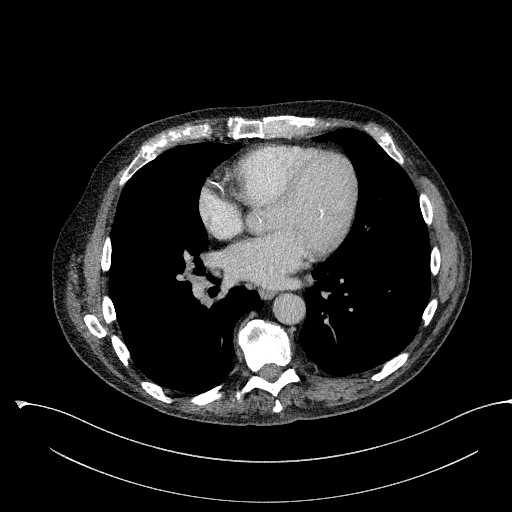
[im 68/182  lung]
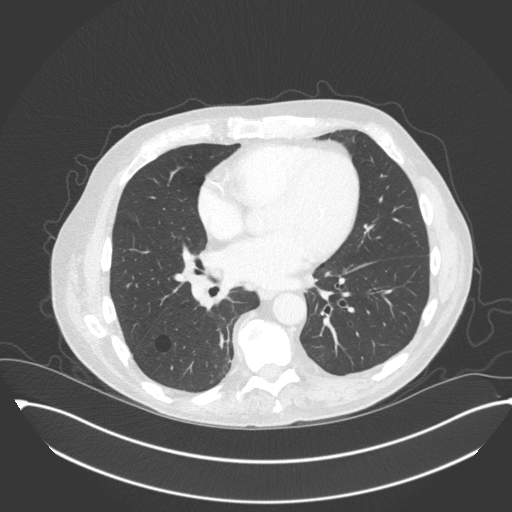
[im 81/182  lung]
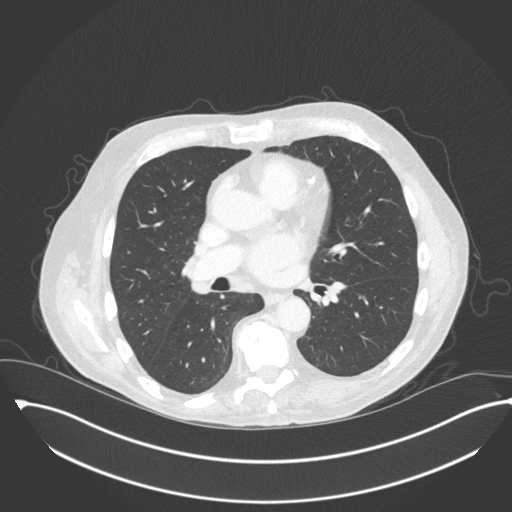
[im 101/182  lung]
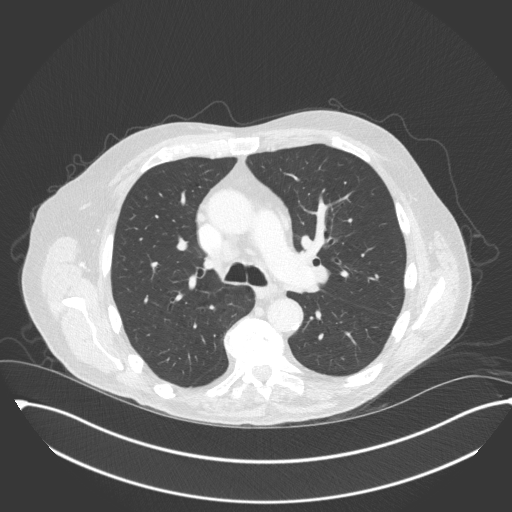
[im 114/182  lung]
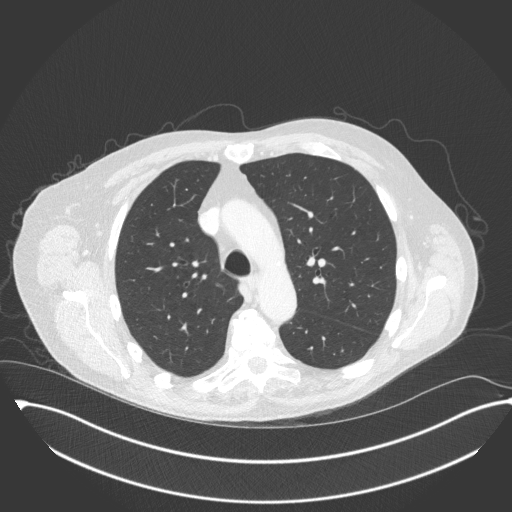
[im 128/182  mediastinal]
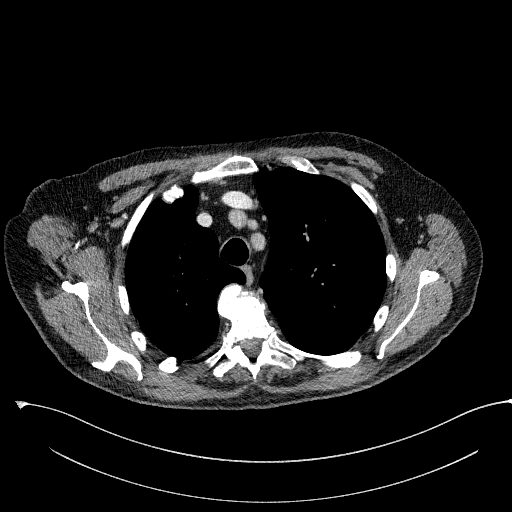
[im 128/182  lung]
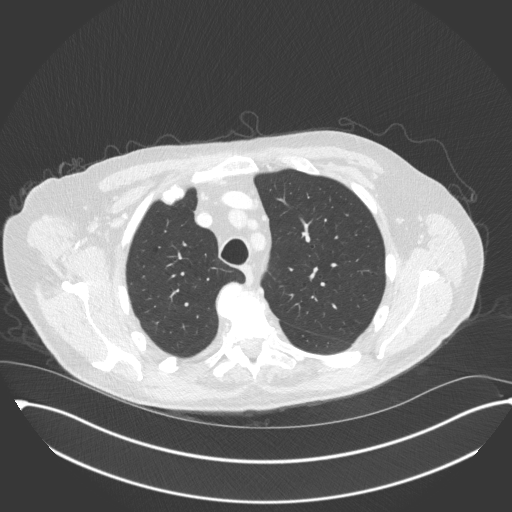
[im 141/182  lung]
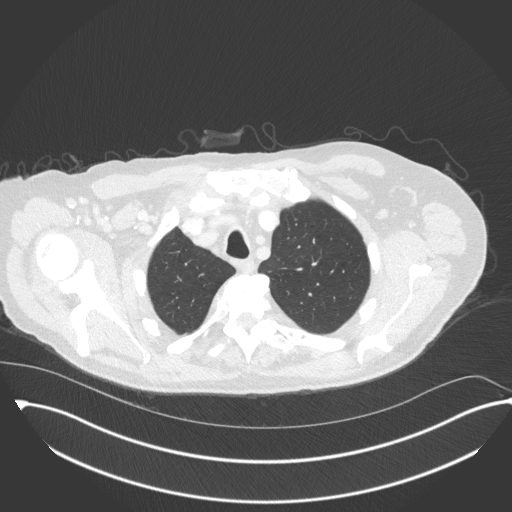
[im 155/182  lung]
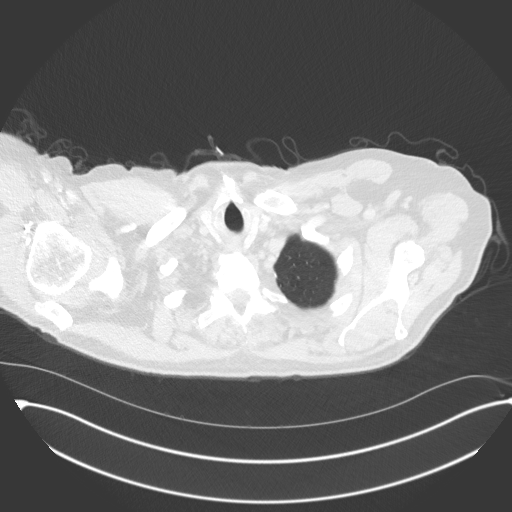
[im 168/182  lung]
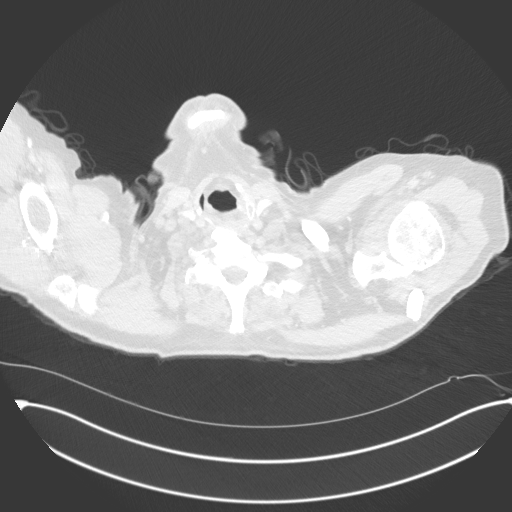

[Series 5: coronal · coronal · 0.71mm/px · 3 of 140 slices shown]
[im 28/140  lung]
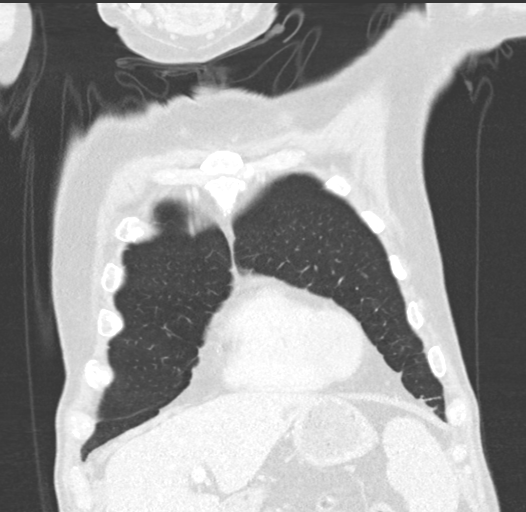
[im 56/140  lung]
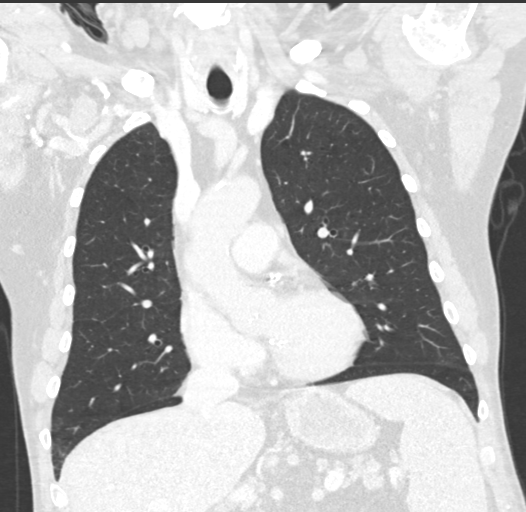
[im 84/140  lung]
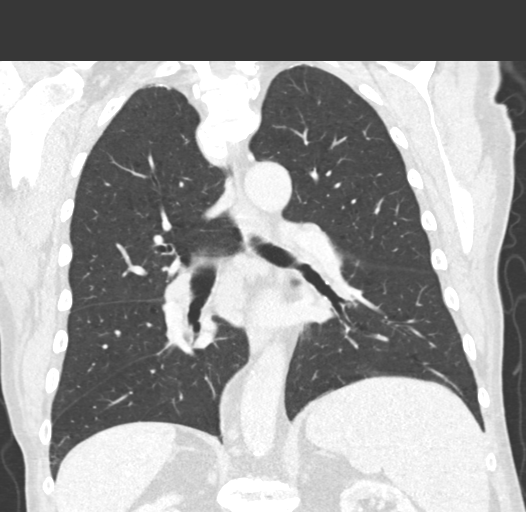

[15 of 36 positions shown; findings below may reference images not displayed]

FINDINGS: Cardiovascular: The heart is normal in size. No pericardial
effusion.

No evidence of thoracic aortic aneurysm. Atherosclerotic
calcifications of the aortic arch.

Coronary atherosclerosis the LAD and right circumflex.

Mediastinum/Nodes: Mild mediastinal and bilateral hilar lymph nodes,
including a dominant 14 mm short axis low right paratracheal node
(series 3/image 80) and 13 mm short axis right hilar node (series
3/image 91), unchanged, compatible with the patient's clinical
history of CLL.

Visualized thyroid is unremarkable.

Lungs/Pleura: Mild biapical pleural-parenchymal scarring.

5 mm triangular subpleural nodule in the right middle lobe (series
7/image 131), likely reflecting a benign subpleural lymph node,
unchanged. Additional 6 mm subpleural nodule in the lateral right
middle lobe (series 7/image 136), also unchanged. 2 mm right upper
lobe nodule along the minor fissure (series 7/image 114), unchanged.
2 mm subpleural nodule in the lateral right upper lobe (series
7/image 35), unchanged. 3 mm subpleural nodule in the posterior
right upper lobe (series 7/image 59), unchanged.

Mild patchy opacity in the medial right lower lobe, likely
atelectasis.

No focal consolidation.

Mild centrilobular emphysematous changes, upper lobe predominant.

No pleural effusion or pneumothorax.

Upper Abdomen: Visualized upper abdomen is notable for splenomegaly,
incompletely visualized.

Musculoskeletal: Degenerative changes of the visualized
thoracolumbar spine.
IMPRESSION: Scattered small bilateral pulmonary nodules, including two dominant
right middle lobe nodules measuring 5-6 mm, unchanged, likely
benign. If clinically warranted, consider a single follow-up CT
chest in 1 year.

This recommendation follows the consensus statement: Guidelines for
Management of Small Pulmonary Nodules Detected on CT Images: From
the [HOSPITAL] [L1]; Radiology [L1]; [DATE].

Mild mediastinal and bilateral hilar lymphadenopathy, likely related
to the patient's known CLL.

Mild splenomegaly, incompletely visualized.

Aortic Atherosclerosis ([L1]-[L1]) and Emphysema ([L1]-[L1]).

## 2017-10-10 MED ORDER — IOPAMIDOL (ISOVUE-300) INJECTION 61%
INTRAVENOUS | Status: AC
  Administered 2017-10-10: 75 mL
  Filled 2017-10-10: qty 75

## 2017-10-22 DIAGNOSIS — E119 Type 2 diabetes mellitus without complications: Secondary | ICD-10-CM | POA: Diagnosis not present

## 2017-10-22 DIAGNOSIS — H35033 Hypertensive retinopathy, bilateral: Secondary | ICD-10-CM | POA: Diagnosis not present

## 2017-10-22 DIAGNOSIS — H5203 Hypermetropia, bilateral: Secondary | ICD-10-CM | POA: Diagnosis not present

## 2017-10-22 DIAGNOSIS — H2513 Age-related nuclear cataract, bilateral: Secondary | ICD-10-CM | POA: Diagnosis not present

## 2017-10-22 DIAGNOSIS — H02135 Senile ectropion of left lower eyelid: Secondary | ICD-10-CM | POA: Diagnosis not present

## 2017-10-22 DIAGNOSIS — H524 Presbyopia: Secondary | ICD-10-CM | POA: Diagnosis not present

## 2017-11-26 DIAGNOSIS — C9190 Lymphoid leukemia, unspecified not having achieved remission: Secondary | ICD-10-CM | POA: Diagnosis not present

## 2017-11-26 DIAGNOSIS — E119 Type 2 diabetes mellitus without complications: Secondary | ICD-10-CM | POA: Diagnosis not present

## 2017-11-26 DIAGNOSIS — I493 Ventricular premature depolarization: Secondary | ICD-10-CM | POA: Diagnosis not present

## 2017-11-26 DIAGNOSIS — I10 Essential (primary) hypertension: Secondary | ICD-10-CM | POA: Diagnosis not present

## 2017-12-03 DIAGNOSIS — L570 Actinic keratosis: Secondary | ICD-10-CM | POA: Diagnosis not present

## 2017-12-03 DIAGNOSIS — Z85820 Personal history of malignant melanoma of skin: Secondary | ICD-10-CM | POA: Diagnosis not present

## 2017-12-03 DIAGNOSIS — X32XXXD Exposure to sunlight, subsequent encounter: Secondary | ICD-10-CM | POA: Diagnosis not present

## 2017-12-03 DIAGNOSIS — D0461 Carcinoma in situ of skin of right upper limb, including shoulder: Secondary | ICD-10-CM | POA: Diagnosis not present

## 2017-12-03 DIAGNOSIS — Z08 Encounter for follow-up examination after completed treatment for malignant neoplasm: Secondary | ICD-10-CM | POA: Diagnosis not present

## 2017-12-15 ENCOUNTER — Other Ambulatory Visit (INDEPENDENT_AMBULATORY_CARE_PROVIDER_SITE_OTHER): Payer: PPO

## 2017-12-15 DIAGNOSIS — R161 Splenomegaly, not elsewhere classified: Secondary | ICD-10-CM | POA: Diagnosis not present

## 2017-12-15 DIAGNOSIS — C9190 Lymphoid leukemia, unspecified not having achieved remission: Secondary | ICD-10-CM

## 2017-12-15 DIAGNOSIS — D696 Thrombocytopenia, unspecified: Secondary | ICD-10-CM | POA: Diagnosis not present

## 2017-12-15 DIAGNOSIS — C9110 Chronic lymphocytic leukemia of B-cell type not having achieved remission: Secondary | ICD-10-CM

## 2017-12-16 ENCOUNTER — Telehealth: Payer: Self-pay | Admitting: Internal Medicine

## 2017-12-16 LAB — CBC WITH DIFFERENTIAL/PLATELET
BASOS: 0 %
Basophils Absolute: 0.1 10*3/uL (ref 0.0–0.2)
EOS (ABSOLUTE): 0.1 10*3/uL (ref 0.0–0.4)
Eos: 0 %
HEMATOCRIT: 44.6 % (ref 37.5–51.0)
Hemoglobin: 15.3 g/dL (ref 13.0–17.7)
IMMATURE GRANS (ABS): 0 10*3/uL (ref 0.0–0.1)
Immature Granulocytes: 0 %
LYMPHS: 93 %
Lymphocytes Absolute: 48.4 10*3/uL — ABNORMAL HIGH (ref 0.7–3.1)
MCH: 33.2 pg — ABNORMAL HIGH (ref 26.6–33.0)
MCHC: 34.3 g/dL (ref 31.5–35.7)
MCV: 97 fL (ref 79–97)
MONOCYTES: 2 %
Monocytes Absolute: 0.9 10*3/uL (ref 0.1–0.9)
NEUTROS ABS: 2.4 10*3/uL (ref 1.4–7.0)
Neutrophils: 5 %
Platelets: 77 10*3/uL — CL (ref 150–379)
RBC: 4.61 x10E6/uL (ref 4.14–5.80)
RDW: 14.2 % (ref 12.3–15.4)
WBC: 51.8 10*3/uL (ref 3.4–10.8)

## 2017-12-16 NOTE — Telephone Encounter (Signed)
Received page from [employer] at 4am alerting to critical value for Mr. Deavon, Heme/onc patient of Dr. Azucena Freed. Alert was for the WBC of 51.8 which are lymphocyte predominant. Below are a few other values obtained from operator as results not visible in our system yet.   WBC 51.8 Hgb 15.3 Hct 44.6 Plts 77 Diff Neut 5%       Lymph 48.4%       Mono 0.9 %  Patient is followed by Dr. Beryle Beams for CLL (Dx in 2017) followed by observation per note review. He has had steadily rising WBC counts, stable Hgb, and Plts usually ~100K. Currently WBC continues to rise, Hgb is stable and platelets seems to be trending down.  No emergent indication for intervention overnight. Will forward to Dr. Beryle Beams for review and further management. These were planned labs for surveillance between appts.  Alphonzo Grieve, MD IMTS - PGY2

## 2017-12-16 NOTE — Telephone Encounter (Signed)
Thanks - he sees me next week; agree no need for immediate intervention

## 2017-12-19 ENCOUNTER — Telehealth: Payer: Self-pay

## 2017-12-19 NOTE — Telephone Encounter (Signed)
Requesting lab results. Please call pt back.  

## 2017-12-19 NOTE — Telephone Encounter (Signed)
I gave Mr Smithson a call, he has access to the labs through My chart.  He was just wondering if this will change any plans,  I discussed Dr Darnell Level is out today but will get in touch with him next week.

## 2017-12-24 NOTE — Telephone Encounter (Signed)
I called him yesterday. He has lab scheduled again for July - needs appt w me in August - I don't have my schedule yet

## 2018-01-05 DIAGNOSIS — Z08 Encounter for follow-up examination after completed treatment for malignant neoplasm: Secondary | ICD-10-CM | POA: Diagnosis not present

## 2018-01-05 DIAGNOSIS — Z85828 Personal history of other malignant neoplasm of skin: Secondary | ICD-10-CM | POA: Diagnosis not present

## 2018-01-29 DIAGNOSIS — I10 Essential (primary) hypertension: Secondary | ICD-10-CM | POA: Diagnosis not present

## 2018-01-29 DIAGNOSIS — I493 Ventricular premature depolarization: Secondary | ICD-10-CM | POA: Insufficient documentation

## 2018-01-29 DIAGNOSIS — E11628 Type 2 diabetes mellitus with other skin complications: Secondary | ICD-10-CM | POA: Diagnosis not present

## 2018-01-29 DIAGNOSIS — R310 Gross hematuria: Secondary | ICD-10-CM | POA: Insufficient documentation

## 2018-01-29 DIAGNOSIS — E785 Hyperlipidemia, unspecified: Secondary | ICD-10-CM | POA: Diagnosis not present

## 2018-02-25 DIAGNOSIS — Z85820 Personal history of malignant melanoma of skin: Secondary | ICD-10-CM | POA: Diagnosis not present

## 2018-02-25 DIAGNOSIS — L570 Actinic keratosis: Secondary | ICD-10-CM | POA: Diagnosis not present

## 2018-02-25 DIAGNOSIS — X32XXXD Exposure to sunlight, subsequent encounter: Secondary | ICD-10-CM | POA: Diagnosis not present

## 2018-02-25 DIAGNOSIS — Z1283 Encounter for screening for malignant neoplasm of skin: Secondary | ICD-10-CM | POA: Diagnosis not present

## 2018-02-25 DIAGNOSIS — L821 Other seborrheic keratosis: Secondary | ICD-10-CM | POA: Diagnosis not present

## 2018-02-25 DIAGNOSIS — Z08 Encounter for follow-up examination after completed treatment for malignant neoplasm: Secondary | ICD-10-CM | POA: Diagnosis not present

## 2018-03-16 ENCOUNTER — Other Ambulatory Visit (INDEPENDENT_AMBULATORY_CARE_PROVIDER_SITE_OTHER): Payer: PPO

## 2018-03-16 DIAGNOSIS — C9190 Lymphoid leukemia, unspecified not having achieved remission: Secondary | ICD-10-CM | POA: Diagnosis not present

## 2018-03-16 DIAGNOSIS — R161 Splenomegaly, not elsewhere classified: Secondary | ICD-10-CM

## 2018-03-16 DIAGNOSIS — C9110 Chronic lymphocytic leukemia of B-cell type not having achieved remission: Secondary | ICD-10-CM

## 2018-03-16 DIAGNOSIS — D696 Thrombocytopenia, unspecified: Secondary | ICD-10-CM | POA: Diagnosis not present

## 2018-03-17 ENCOUNTER — Telehealth: Payer: Self-pay | Admitting: *Deleted

## 2018-03-17 LAB — COMPREHENSIVE METABOLIC PANEL
A/G RATIO: 2.4 — AB (ref 1.2–2.2)
ALK PHOS: 91 IU/L (ref 39–117)
ALT: 10 IU/L (ref 0–44)
AST: 18 IU/L (ref 0–40)
Albumin: 4.4 g/dL (ref 3.5–4.7)
BUN/Creatinine Ratio: 7 — ABNORMAL LOW (ref 10–24)
BUN: 7 mg/dL — AB (ref 8–27)
Bilirubin Total: 1 mg/dL (ref 0.0–1.2)
CHLORIDE: 88 mmol/L — AB (ref 96–106)
CO2: 26 mmol/L (ref 20–29)
Calcium: 9.2 mg/dL (ref 8.6–10.2)
Creatinine, Ser: 0.97 mg/dL (ref 0.76–1.27)
GFR calc Af Amer: 81 mL/min/{1.73_m2} (ref >59)
GFR calc non Af Amer: 70 mL/min/{1.73_m2} (ref >59)
GLUCOSE: 123 mg/dL — AB (ref 65–99)
Globulin, Total: 1.8 g/dL (ref 1.5–4.5)
POTASSIUM: 4.4 mmol/L (ref 3.5–5.2)
Sodium: 131 mmol/L — ABNORMAL LOW (ref 134–144)
Total Protein: 6.2 g/dL (ref 6.0–8.5)

## 2018-03-17 LAB — CBC WITH DIFFERENTIAL/PLATELET
Basophils Absolute: 0.1 10*3/uL (ref 0.0–0.2)
Basos: 0 %
EOS (ABSOLUTE): 0.1 10*3/uL (ref 0.0–0.4)
Eos: 0 %
Hematocrit: 43.7 % (ref 37.5–51.0)
Hemoglobin: 14.6 g/dL (ref 13.0–17.7)
IMMATURE GRANS (ABS): 0 10*3/uL (ref 0.0–0.1)
Immature Granulocytes: 0 %
LYMPHS: 96 %
Lymphocytes Absolute: 48.1 10*3/uL — ABNORMAL HIGH (ref 0.7–3.1)
MCH: 33 pg (ref 26.6–33.0)
MCHC: 33.4 g/dL (ref 31.5–35.7)
MCV: 99 fL — ABNORMAL HIGH (ref 79–97)
MONOCYTES: 1 %
Monocytes Absolute: 0.7 10*3/uL (ref 0.1–0.9)
Neutrophils Absolute: 1.6 10*3/uL (ref 1.4–7.0)
Neutrophils: 3 %
PLATELETS: 78 10*3/uL — AB (ref 150–450)
RBC: 4.43 x10E6/uL (ref 4.14–5.80)
RDW: 14.1 % (ref 12.3–15.4)
WBC: 50.6 10*3/uL — AB (ref 3.4–10.8)

## 2018-03-17 LAB — LACTATE DEHYDROGENASE: LDH: 142 IU/L (ref 121–224)

## 2018-03-17 NOTE — Telephone Encounter (Signed)
-----   Message from Annia Belt, MD sent at 03/17/2018  7:54 AM EDT ----- Call pt: blood counts stable; almost identical to last time. WBC 51,000, platelets 78,000

## 2018-03-17 NOTE — Telephone Encounter (Signed)
Pt called / informed "blood counts stable; almost identical to last time. WBC 51,000 (50.6) and platelets 78,000 " per Dr Beryle Beams. Stated he had seen results this morning and thanks for calling.

## 2018-04-06 ENCOUNTER — Encounter: Payer: Self-pay | Admitting: Oncology

## 2018-04-06 ENCOUNTER — Other Ambulatory Visit: Payer: Self-pay

## 2018-04-06 ENCOUNTER — Ambulatory Visit (INDEPENDENT_AMBULATORY_CARE_PROVIDER_SITE_OTHER): Payer: PPO | Admitting: Oncology

## 2018-04-06 VITALS — BP 148/65 | HR 72 | Temp 97.9°F | Ht 71.0 in | Wt 187.8 lb

## 2018-04-06 DIAGNOSIS — D696 Thrombocytopenia, unspecified: Secondary | ICD-10-CM | POA: Diagnosis not present

## 2018-04-06 DIAGNOSIS — Z7984 Long term (current) use of oral hypoglycemic drugs: Secondary | ICD-10-CM | POA: Diagnosis not present

## 2018-04-06 DIAGNOSIS — Z9079 Acquired absence of other genital organ(s): Secondary | ICD-10-CM | POA: Diagnosis not present

## 2018-04-06 DIAGNOSIS — R911 Solitary pulmonary nodule: Secondary | ICD-10-CM

## 2018-04-06 DIAGNOSIS — C9110 Chronic lymphocytic leukemia of B-cell type not having achieved remission: Secondary | ICD-10-CM | POA: Diagnosis not present

## 2018-04-06 DIAGNOSIS — E119 Type 2 diabetes mellitus without complications: Secondary | ICD-10-CM

## 2018-04-06 DIAGNOSIS — K5730 Diverticulosis of large intestine without perforation or abscess without bleeding: Secondary | ICD-10-CM

## 2018-04-06 DIAGNOSIS — R161 Splenomegaly, not elsewhere classified: Secondary | ICD-10-CM | POA: Diagnosis not present

## 2018-04-06 DIAGNOSIS — N401 Enlarged prostate with lower urinary tract symptoms: Secondary | ICD-10-CM | POA: Diagnosis not present

## 2018-04-06 DIAGNOSIS — Z79899 Other long term (current) drug therapy: Secondary | ICD-10-CM | POA: Diagnosis not present

## 2018-04-06 DIAGNOSIS — R319 Hematuria, unspecified: Secondary | ICD-10-CM | POA: Diagnosis not present

## 2018-04-06 DIAGNOSIS — I10 Essential (primary) hypertension: Secondary | ICD-10-CM | POA: Diagnosis not present

## 2018-04-06 NOTE — Progress Notes (Signed)
Hematology and Oncology Follow Up Visit  Thomas Zavala [id number] 12-19-1927 84 y.o. 04/06/2018 7:03 PM   Principle Diagnosis: Encounter Diagnoses  Name Primary?  . CLL (chronic lymphocytic leukemia) ([place]) Yes  . Splenomegaly   . Benign prostatic hyperplasia with lower urinary tract symptoms, symptom details unspecified   Clinical summary: 84 year old man diagnosed with stage II chronic lymphocytic leukemia in December 2017 when he was found to have an elevated white blood count with absolute lymphocytosis in December 2017. Subsequent CBCsshowed mild thrombocytopenia. Physical exam remarkableinitiallyfor the absence of lymphadenopathy but the presence of splenomegaly with spleen palpable 5 cm below left costal margin. Flow cytometry on peripheral blood confirmed a monoclonal population of B cells. 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 CT scan of the abdomen done September 03, 2016 to look for liver pathology showed a 2 cm benign hemangioma in the liver which was otherwise normal. Maximum spleen dimension 15.6 cm. A 1.3 cm low attenuation lesion in the spleen. A 4 mm nodule seen in the inferior aspect of the right middle lobe. Minimally enlarged abdominal lymph nodes in the gastrohepatic ligament and porta hepatis maximum dimension 1.4 cm. Mildly enlarged prostate gland. Status post TURP. Incidental colonic diverticulosis. He was asymptomatic. He had prior vaccination with the zoster vaccine and with Pneumovax. He has been followed with observation alone. White count has slowly drifted upand he has developed multiple small lymph nodes in the neck and axillae.  Interim History: Overall doing well.  He states his appetite is erratic but he is not losing weight.  No other constitutional symptoms.  No interim infections. He reports some intermittent hematuria which she has had before with his chronic  prostate problems.  He has had 2 surgical procedures in the past.  Hemoglobin remains normal.  I will make a urology referral. White count slowly rising with a number of random fluctuations.  Lowest count recorded in June 2018 of 10,000.  Highest count 52,000 in April of this year with most recent count 51,000 on March 16, 2018.  Platelets also fluctuating with a high of 107,000 and a low of 77,000 with most recent value of 78,000 on July 29. Spleen size remains stable on exam today which will be documented below.  Medications: reviewed  Allergies: No Known Allergies  Review of Systems: See interim history Remaining ROS negative:   Physical Exam: Blood pressure (!) 148/65, pulse 72, temperature 97.9 F (36.6 C), height 5\' 11"  (1.803 m), weight 187 lb 12.8 oz (85.2 kg), SpO2 99 %. Wt Readings from Last 3 Encounters:  04/06/18 187 lb 12.8 oz (85.2 kg)  09/16/17 185 lb 4.8 oz (84.1 kg)  05/12/17 189 lb (85.7 kg)     General appearance: [name]. HENNT: Pharynx no erythema, exudate, mass, or ulcer. No thyromegaly or thyroid nodules.  Chronic he is ectropion left eye. Lymph nodes: No cervical, supraclavicular, or axillary lymphadenopathy.  In fact small cervical nodes palpable at time of last visit no longer palpable. Breasts: Lungs: Clear to auscultation, resonant to percussion throughout Heart: Regular rhythm, no murmur, no gallop, no rub, no click, no edema Abdomen: Soft, nontender, normal bowel sounds, no mass, spleen remains enlarged about 5 cm below left costal margin unchanged from prior exam. Extremities: No edema, no calf tenderness Musculoskeletal: no joint deformities GU:  Vascular: Carotid pulses 2+, no bruits, distal pulses: Neurologic: Alert, oriented, PERRLA, left easy tropia chronic, cranial nerves grossly normal, motor strength 5  over 5, reflexes 1+ symmetric, upper body coordination normal, gait normal, Skin: No rash; thin skin with scattered  ecchymosis on the dorsum of his hands and forearms.  Lab Results: CBC W/Diff    Component Value Date/Time   WBC 50.6 (HH) 03/16/2018 1127   WBC 45.9 (H) 09/16/2017 1121   RBC 4.43 03/16/2018 1127   RBC 4.77 09/16/2017 1121   HGB 14.6 03/16/2018 1127   HCT 43.7 03/16/2018 1127   PLT 78 (LL) 03/16/2018 1127   MCV 99 (H) 03/16/2018 1127   MCH 33.0 03/16/2018 1127   MCH 33.1 09/16/2017 1121   MCHC 33.4 03/16/2018 1127   MCHC 34.7 09/16/2017 1121   RDW 14.1 03/16/2018 1127   LYMPHSABS 48.1 (H) 03/16/2018 1127   MONOABS 0.9 09/16/2017 1121   EOSABS 0.1 03/16/2018 1127   BASOSABS 0.1 03/16/2018 1127     Chemistry      Component Value Date/Time   NA 131 (L) 03/16/2018 1127   K 4.4 03/16/2018 1127   CL 88 (L) 03/16/2018 1127   CO2 26 03/16/2018 1127   BUN 7 (L) 03/16/2018 1127   CREATININE 0.97 03/16/2018 1127      Component Value Date/Time   CALCIUM 9.2 03/16/2018 1127   ALKPHOS 91 03/16/2018 1127   AST 18 03/16/2018 1127   ALT 10 03/16/2018 1127   BILITOT 1.0 03/16/2018 1127       Radiological Studies: No results found.  Impression: 1.  Chronic lymphocytic leukemia diagnosed in 2017 not requiring treatment so far.  Unmutated immunoglobulin heavy chain variable region status but negative deletion 17. Disconnect between splenomegaly/thrombocytopenia in the absence of anemia or lymphadenopathy making RAI staging  inaccurate.  Although he would technically qualify for treatment with platelet count less than 100,000, platelet count has fluctuated and spleen size remains stable.  He remains asymptomatic. We discussed he probably will go on treatment within the next 6 to 12 months.  We had a discussion about the use of ibrutinib which is now the treatment of choice as first-line in patients of all age groups. We discussed potential side effects of this drug including anemia, increased risk for bleeding, increased risk primarily for atrial fibrillation but some ventricular  arrhythmias seen as well, and hypertension. If acalbrutinib approved it may present less of a cardiac risk.  This being said, this drug is a much better safety profile than almost anything else we could offer except for single agent anti-CD20 antibody therapy. I am going to see him again in 3 months and then transition his hematology care to Dr.Kale at the [facility].  2.  Type 2 diabetes on oral agent  3.  Benign prostatic hypertrophy  4.  Intermittent hematuria related to #3. Plan: Urology referral  5.  Essential hypertension  6.  History of palpitations controlled on beta-blocker.  7.  4 mm right upper lobe pulmonary nodule stable on serial CT scans.  Most recent scan done October 10, 2017 mentioning scattered small bilateral pulmonary nodules largest right middle lobe 5-6 mm unchanged and likely benign.   CC: Patient Care Team: Jettie Booze, NP as PCP - General (Family Medicine)   Murriel Hopper, MD, [place]  Hematology-Oncology/Internal Medicine     8/19/20197:03 PM

## 2018-04-06 NOTE — Patient Instructions (Addendum)
Lab in 3 months MD visit  1-2 weeks after lab  Please schedule new patient visit with Dr Irene Limbo at [facility] for January, 2020  84 y/o stage 3-4 CLL not yet on Rx; splenomegaly/thrombocytopenia but no anemia or adenopathy

## 2018-04-15 DIAGNOSIS — L570 Actinic keratosis: Secondary | ICD-10-CM | POA: Diagnosis not present

## 2018-04-15 DIAGNOSIS — X32XXXD Exposure to sunlight, subsequent encounter: Secondary | ICD-10-CM | POA: Diagnosis not present

## 2018-05-06 ENCOUNTER — Other Ambulatory Visit: Payer: Self-pay | Admitting: Pharmacist

## 2018-05-06 NOTE — Patient Outreach (Signed)
[facility]) Care Management  05/06/2018  Thomas Zavala 24-Sep-1927 [id number]   Incoming call from Thomas Zavala in response to the [facility] Medication Adherence Campaign. Speak with patient. HIPAA identifiers verified and verbal consent received. Patient's daughter is also on the phone with him.  [name] reports that he takes his pravastatin 40 mg once daily as directed. Denies any missed doses or barriers to adherence. Counsel on the importance of medication adherence. Reports that he uses a weekly pillbox to organize his medications. Confirm that patient is aware of the cost savings of getting a 90 day supply of his medications through his insurance.  Patient and daughter deny any medication questions/concerns at this time and thank me for this call.  Will close pharmacy episode.  Harlow Asa, PharmD, [facility] [phone]

## 2018-05-12 ENCOUNTER — Other Ambulatory Visit: Payer: Self-pay | Admitting: Oncology

## 2018-05-12 DIAGNOSIS — C9110 Chronic lymphocytic leukemia of B-cell type not having achieved remission: Secondary | ICD-10-CM

## 2018-05-12 DIAGNOSIS — N400 Enlarged prostate without lower urinary tract symptoms: Secondary | ICD-10-CM | POA: Diagnosis not present

## 2018-05-12 DIAGNOSIS — R310 Gross hematuria: Secondary | ICD-10-CM | POA: Diagnosis not present

## 2018-05-29 DIAGNOSIS — R310 Gross hematuria: Secondary | ICD-10-CM | POA: Diagnosis not present

## 2018-05-29 DIAGNOSIS — C9110 Chronic lymphocytic leukemia of B-cell type not having achieved remission: Secondary | ICD-10-CM | POA: Diagnosis not present

## 2018-06-04 DIAGNOSIS — I10 Essential (primary) hypertension: Secondary | ICD-10-CM | POA: Diagnosis not present

## 2018-06-04 DIAGNOSIS — E119 Type 2 diabetes mellitus without complications: Secondary | ICD-10-CM | POA: Diagnosis not present

## 2018-06-04 DIAGNOSIS — C9110 Chronic lymphocytic leukemia of B-cell type not having achieved remission: Secondary | ICD-10-CM | POA: Diagnosis not present

## 2018-06-04 DIAGNOSIS — I493 Ventricular premature depolarization: Secondary | ICD-10-CM | POA: Diagnosis not present

## 2018-06-05 DIAGNOSIS — R310 Gross hematuria: Secondary | ICD-10-CM | POA: Diagnosis not present

## 2018-06-08 DIAGNOSIS — Z23 Encounter for immunization: Secondary | ICD-10-CM | POA: Diagnosis not present

## 2018-07-07 ENCOUNTER — Other Ambulatory Visit (INDEPENDENT_AMBULATORY_CARE_PROVIDER_SITE_OTHER): Payer: PPO

## 2018-07-07 DIAGNOSIS — R161 Splenomegaly, not elsewhere classified: Secondary | ICD-10-CM

## 2018-07-07 DIAGNOSIS — C9110 Chronic lymphocytic leukemia of B-cell type not having achieved remission: Secondary | ICD-10-CM | POA: Diagnosis not present

## 2018-07-08 ENCOUNTER — Telehealth: Payer: Self-pay | Admitting: *Deleted

## 2018-07-08 LAB — CBC WITH DIFFERENTIAL/PLATELET
BASOS ABS: 0.2 10*3/uL (ref 0.0–0.2)
Basos: 0 %
EOS (ABSOLUTE): 0.2 10*3/uL (ref 0.0–0.4)
EOS: 1 %
HEMOGLOBIN: 14.3 g/dL (ref 13.0–17.7)
Hematocrit: 43.5 % (ref 37.5–51.0)
IMMATURE GRANS (ABS): 0 10*3/uL (ref 0.0–0.1)
Immature Granulocytes: 0 %
LYMPHS ABS: 41.3 10*3/uL — AB (ref 0.7–3.1)
LYMPHS: 87 %
MCH: 32.6 pg (ref 26.6–33.0)
MCHC: 32.9 g/dL (ref 31.5–35.7)
MCV: 99 fL — ABNORMAL HIGH (ref 79–97)
MONOCYTES: 6 %
Monocytes Absolute: 2.8 10*3/uL — ABNORMAL HIGH (ref 0.1–0.9)
Neutrophils Absolute: 2.7 10*3/uL (ref 1.4–7.0)
Neutrophils: 6 %
PLATELETS: 73 10*3/uL — AB (ref 150–450)
RBC: 4.38 x10E6/uL (ref 4.14–5.80)
RDW: 12.6 % (ref 12.3–15.4)
WBC: 47.3 10*3/uL — AB (ref 3.4–10.8)

## 2018-07-08 LAB — COMPREHENSIVE METABOLIC PANEL
ALBUMIN: 4.5 g/dL (ref 3.5–4.7)
ALK PHOS: 87 IU/L (ref 39–117)
ALT: 12 IU/L (ref 0–44)
AST: 20 IU/L (ref 0–40)
Albumin/Globulin Ratio: 2.6 — ABNORMAL HIGH (ref 1.2–2.2)
BILIRUBIN TOTAL: 0.7 mg/dL (ref 0.0–1.2)
BUN / CREAT RATIO: 10 (ref 10–24)
BUN: 11 mg/dL (ref 8–27)
CHLORIDE: 92 mmol/L — AB (ref 96–106)
CO2: 24 mmol/L (ref 20–29)
CREATININE: 1.07 mg/dL (ref 0.76–1.27)
Calcium: 9.3 mg/dL (ref 8.6–10.2)
GFR calc non Af Amer: 62 mL/min/{1.73_m2} (ref >59)
GFR, EST AFRICAN AMERICAN: 71 mL/min/{1.73_m2} (ref >59)
GLUCOSE: 160 mg/dL — AB (ref 65–99)
Globulin, Total: 1.7 g/dL (ref 1.5–4.5)
Potassium: 3.6 mmol/L (ref 3.5–5.2)
Sodium: 135 mmol/L (ref 134–144)
TOTAL PROTEIN: 6.2 g/dL (ref 6.0–8.5)

## 2018-07-08 LAB — BETA 2 MICROGLOBULIN, SERUM: Beta-2: 3 mg/L — ABNORMAL HIGH (ref 0.6–2.4)

## 2018-07-08 LAB — LACTATE DEHYDROGENASE: LDH: 145 IU/L (ref 121–224)

## 2018-07-08 NOTE — Telephone Encounter (Signed)
Pt called / informed "blood counts stable compared with previous. WBC 47,000; was 51,000. Platelets 73,000 - previous 78,000 = insignificant change " per Dr Beryle Beams. Stated thanks for calling and he will see [place] on Dec 2.

## 2018-07-08 NOTE — Telephone Encounter (Signed)
-----   Message from Annia Belt, MD sent at 07/08/2018 10:41 AM EST ----- Call pt: blood counts stable compared with previous. WBC 47,000; was 51,000. Platelets 73,000 - previous 78,000 = insignificant change

## 2018-07-20 ENCOUNTER — Other Ambulatory Visit: Payer: Self-pay

## 2018-07-20 ENCOUNTER — Encounter: Payer: Self-pay | Admitting: Oncology

## 2018-07-20 ENCOUNTER — Ambulatory Visit (INDEPENDENT_AMBULATORY_CARE_PROVIDER_SITE_OTHER): Payer: PPO | Admitting: Oncology

## 2018-07-20 VITALS — BP 162/80 | HR 70 | Temp 98.4°F | Wt 189.6 lb

## 2018-07-20 DIAGNOSIS — K5730 Diverticulosis of large intestine without perforation or abscess without bleeding: Secondary | ICD-10-CM | POA: Diagnosis not present

## 2018-07-20 DIAGNOSIS — R319 Hematuria, unspecified: Secondary | ICD-10-CM

## 2018-07-20 DIAGNOSIS — Z9079 Acquired absence of other genital organ(s): Secondary | ICD-10-CM | POA: Diagnosis not present

## 2018-07-20 DIAGNOSIS — Z8679 Personal history of other diseases of the circulatory system: Secondary | ICD-10-CM

## 2018-07-20 DIAGNOSIS — R161 Splenomegaly, not elsewhere classified: Secondary | ICD-10-CM

## 2018-07-20 DIAGNOSIS — N400 Enlarged prostate without lower urinary tract symptoms: Secondary | ICD-10-CM | POA: Diagnosis not present

## 2018-07-20 DIAGNOSIS — C9110 Chronic lymphocytic leukemia of B-cell type not having achieved remission: Secondary | ICD-10-CM

## 2018-07-20 DIAGNOSIS — R911 Solitary pulmonary nodule: Secondary | ICD-10-CM

## 2018-07-20 DIAGNOSIS — E119 Type 2 diabetes mellitus without complications: Secondary | ICD-10-CM | POA: Diagnosis not present

## 2018-07-20 DIAGNOSIS — E785 Hyperlipidemia, unspecified: Secondary | ICD-10-CM

## 2018-07-20 DIAGNOSIS — Z7984 Long term (current) use of oral hypoglycemic drugs: Secondary | ICD-10-CM

## 2018-07-20 DIAGNOSIS — D1809 Hemangioma of other sites: Secondary | ICD-10-CM | POA: Diagnosis not present

## 2018-07-20 DIAGNOSIS — I10 Essential (primary) hypertension: Secondary | ICD-10-CM | POA: Diagnosis not present

## 2018-07-20 DIAGNOSIS — Z79899 Other long term (current) drug therapy: Secondary | ICD-10-CM

## 2018-07-20 NOTE — Patient Instructions (Signed)
Return as needed to see Dr Darnell Level before March, 2020  We made a referral to Dr Irene Limbo at the [facility] for your ongoing Hematology care.  You should get a call after the New Year. If you don't hear anything by the end of January, call [phone].

## 2018-07-20 NOTE — Progress Notes (Signed)
Hematology and Oncology Follow Up Visit  Thomas Zavala [id number] 10/09/1927 84 y.o. 07/20/2018 7:44 PM   Principle Diagnosis: Encounter Diagnoses  Name Primary?  . CLL (chronic lymphocytic leukemia) ([place]) Yes  . Splenomegaly   Clinical summary: 84 year old man diagnosed with stage II chronic lymphocytic leukemia in December 2017 when he was found to have an elevated white blood count with absolute lymphocytosis in December 2017. Subsequent CBCsshowed mild thrombocytopenia. Physical exam remarkableinitiallyfor the absence of lymphadenopathy but the presence of splenomegaly with spleen palpable 5 cm below left costal margin. Flow cytometry on peripheral blood confirmed a monoclonal population of B cells. 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 CT scan of the abdomen done September 03, 2016 to look for liver pathology showed a 2 cm benign hemangioma in the liver which was otherwise normal. Maximum spleen dimension 15.6 cm. A 1.3 cm low attenuation lesion in the spleen. A 4 mm nodule seen in the inferior aspect of the right middle lobe. Minimally enlarged abdominal lymph nodes in the gastrohepatic ligament and porta hepatis maximum dimension 1.4 cm. Mildly enlarged prostate gland. Status post TURP. Incidental colonic diverticulosis. He was asymptomatic. He had prior vaccination with the zoster vaccine and with Pneumovax. He has been followed with observation alone. White count has slowly drifted upand he has developed multiple small lymph nodes in the neck and axillae.  Interim History: Overall doing well.  He had recurrent intermittent hematuria and I referred him to urology in view of his chronic prostate problems status post TURP x2.  He hit it off with the urologist Dr. Gloriann Loan who was also from [place].  He underwent a CT IVP and a flexible office cystoscopy on June 05, 2018.  No signs of obstruction.  No  malignant changes.  No renal mass. He just had his 89th birthday 2 weeks ago.  Overall his endurance is down but he still remains active.  Medications: reviewed  Allergies: No Known Allergies  Review of Systems: See interim history Remaining ROS negative:   Physical Exam: Blood pressure (!) 162/80, pulse 70, temperature 98.4 F (36.9 C), temperature source Oral, weight 189 lb 9.6 oz (86 kg), SpO2 99 %. Wt Readings from Last 3 Encounters:  07/20/18 189 lb 9.6 oz (86 kg)  04/06/18 187 lb 12.8 oz (85.2 kg)  09/16/17 185 lb 4.8 oz (84.1 kg)     General appearance: [name] HENNT: Left esotropion.  Pharynx no erythema, exudate, mass, or ulcer. No thyromegaly or thyroid nodules Lymph nodes: No cervical, supraclavicular, or axillary lymphadenopathy (small cervical and axillary nodes palpable at time of last visit no longer palpable). Breasts: Lungs: Clear to auscultation, resonant to percussion throughout Heart: Regular rhythm, no murmur, no gallop, no rub, no click, no edema Abdomen: Soft, nontender, normal bowel sounds, no mass, spleen stable at approximately 5 cm below left costal margin Extremities: No edema, no calf tenderness Musculoskeletal: no joint deformities GU:  Vascular: Carotid pulses 2+, no bruits, distal pulses: Neurologic: Alert, oriented, PERRLA,, cranial nerves grossly normal, motor strength 5 over 5, reflexes 1+ symmetric at the biceps, absent symmetric at the knees, upper body coordination normal, gait normal, Skin: No rash or ecchymosis  Lab Results: CBC W/Diff    Component Value Date/Time   WBC 47.3 (HH) 07/07/2018 1026   WBC 45.9 (H) 09/16/2017 1121   RBC 4.38 07/07/2018 1026   RBC 4.77 09/16/2017 1121   HGB 14.3 07/07/2018 1026   HCT 43.5  07/07/2018 1026   PLT 73 (LL) 07/07/2018 1026   MCV 99 (H) 07/07/2018 1026   MCH 32.6 07/07/2018 1026   MCH 33.1 09/16/2017 1121   MCHC 32.9 07/07/2018 1026   MCHC 34.7 09/16/2017 1121   RDW  12.6 07/07/2018 1026   LYMPHSABS 41.3 (H) 07/07/2018 1026   MONOABS 0.9 09/16/2017 1121   EOSABS 0.2 07/07/2018 1026   BASOSABS 0.2 07/07/2018 1026     Chemistry      Component Value Date/Time   NA 135 07/07/2018 1026   K 3.6 07/07/2018 1026   CL 92 (L) 07/07/2018 1026   CO2 24 07/07/2018 1026   BUN 11 07/07/2018 1026   CREATININE 1.07 07/07/2018 1026      Component Value Date/Time   CALCIUM 9.3 07/07/2018 1026   ALKPHOS 87 07/07/2018 1026   AST 20 07/07/2018 1026   ALT 12 07/07/2018 1026   BILITOT 0.7 07/07/2018 1026       Radiological Studies: No results found.  Impression: 1.RAI technically stage IV by virtue of thrombocytopenia but he has minimal to no lymphadenopathy, no anemia, and platelet count has been overall stable on observation alone and is likely primarily due to splenomegaly. Cytogenetics with trisomy 62.  No deletion 17. Immunoglobulin heavy chain variable region is unmutated. Despite some negative prognostic features, his disease has been indolent since diagnosis in December 2017. Plan: Continue observation alone with blood counts and clinical exams every 3 months. Ibrutinib or acalabrutinib for any significant progression.  We discussed that there is no indication to start drug therapy at this time.  2.  Type 2 diabetes on metformin  3.  BPH  4.  Intermittent hematuria secondary to #3. Recent urologic evaluation.  See interim history above.  5.  Essential hypertension controlled on Hyzaar  6.  History of palpitations controlled on metoprolol  7.  Hyperlipidemia on pravastatin   CC: Patient Care Team: Jettie Booze, NP as PCP - General (Family Medicine)   Murriel Hopper, MD, [place]  Hematology-Oncology/Internal Medicine     12/2/20197:44 PM

## 2018-07-29 DIAGNOSIS — D696 Thrombocytopenia, unspecified: Secondary | ICD-10-CM | POA: Diagnosis not present

## 2018-07-29 DIAGNOSIS — Z23 Encounter for immunization: Secondary | ICD-10-CM | POA: Diagnosis not present

## 2018-07-29 DIAGNOSIS — I10 Essential (primary) hypertension: Secondary | ICD-10-CM | POA: Diagnosis not present

## 2018-07-29 DIAGNOSIS — E11628 Type 2 diabetes mellitus with other skin complications: Secondary | ICD-10-CM | POA: Diagnosis not present

## 2018-07-29 DIAGNOSIS — E785 Hyperlipidemia, unspecified: Secondary | ICD-10-CM | POA: Diagnosis not present

## 2018-07-29 DIAGNOSIS — C9110 Chronic lymphocytic leukemia of B-cell type not having achieved remission: Secondary | ICD-10-CM | POA: Diagnosis not present

## 2018-08-31 DIAGNOSIS — Z23 Encounter for immunization: Secondary | ICD-10-CM | POA: Diagnosis not present

## 2018-08-31 DIAGNOSIS — Z08 Encounter for follow-up examination after completed treatment for malignant neoplasm: Secondary | ICD-10-CM | POA: Diagnosis not present

## 2018-08-31 DIAGNOSIS — L570 Actinic keratosis: Secondary | ICD-10-CM | POA: Diagnosis not present

## 2018-08-31 DIAGNOSIS — C44629 Squamous cell carcinoma of skin of left upper limb, including shoulder: Secondary | ICD-10-CM | POA: Diagnosis not present

## 2018-08-31 DIAGNOSIS — L821 Other seborrheic keratosis: Secondary | ICD-10-CM | POA: Diagnosis not present

## 2018-08-31 DIAGNOSIS — Z1283 Encounter for screening for malignant neoplasm of skin: Secondary | ICD-10-CM | POA: Diagnosis not present

## 2018-08-31 DIAGNOSIS — Z85820 Personal history of malignant melanoma of skin: Secondary | ICD-10-CM | POA: Diagnosis not present

## 2018-08-31 DIAGNOSIS — X32XXXD Exposure to sunlight, subsequent encounter: Secondary | ICD-10-CM | POA: Diagnosis not present

## 2018-09-04 DIAGNOSIS — X32XXXD Exposure to sunlight, subsequent encounter: Secondary | ICD-10-CM | POA: Diagnosis not present

## 2018-09-04 DIAGNOSIS — L570 Actinic keratosis: Secondary | ICD-10-CM | POA: Diagnosis not present

## 2018-09-22 ENCOUNTER — Other Ambulatory Visit (INDEPENDENT_AMBULATORY_CARE_PROVIDER_SITE_OTHER): Payer: PPO

## 2018-09-22 DIAGNOSIS — X32XXXD Exposure to sunlight, subsequent encounter: Secondary | ICD-10-CM | POA: Diagnosis not present

## 2018-09-22 DIAGNOSIS — R161 Splenomegaly, not elsewhere classified: Secondary | ICD-10-CM | POA: Diagnosis not present

## 2018-09-22 DIAGNOSIS — C9110 Chronic lymphocytic leukemia of B-cell type not having achieved remission: Secondary | ICD-10-CM

## 2018-09-22 DIAGNOSIS — Z08 Encounter for follow-up examination after completed treatment for malignant neoplasm: Secondary | ICD-10-CM | POA: Diagnosis not present

## 2018-09-22 DIAGNOSIS — L980 Pyogenic granuloma: Secondary | ICD-10-CM | POA: Diagnosis not present

## 2018-09-22 DIAGNOSIS — L570 Actinic keratosis: Secondary | ICD-10-CM | POA: Diagnosis not present

## 2018-09-22 DIAGNOSIS — Z85828 Personal history of other malignant neoplasm of skin: Secondary | ICD-10-CM | POA: Diagnosis not present

## 2018-09-23 ENCOUNTER — Telehealth: Payer: Self-pay | Admitting: *Deleted

## 2018-09-23 LAB — CBC WITH DIFFERENTIAL/PLATELET
BASOS: 0 %
Basophils Absolute: 0.1 10*3/uL (ref 0.0–0.2)
EOS (ABSOLUTE): 0.2 10*3/uL (ref 0.0–0.4)
EOS: 0 %
HEMOGLOBIN: 15.4 g/dL (ref 13.0–17.7)
Hematocrit: 44.3 % (ref 37.5–51.0)
Immature Grans (Abs): 0 10*3/uL (ref 0.0–0.1)
Immature Granulocytes: 0 %
LYMPHS ABS: 55.1 10*3/uL — AB (ref 0.7–3.1)
Lymphs: 90 %
MCH: 33.8 pg — ABNORMAL HIGH (ref 26.6–33.0)
MCHC: 34.8 g/dL (ref 31.5–35.7)
MCV: 97 fL (ref 79–97)
MONOS ABS: 3 10*3/uL — AB (ref 0.1–0.9)
Monocytes: 5 %
NEUTROS ABS: 3.3 10*3/uL (ref 1.4–7.0)
Neutrophils: 5 %
Platelets: 83 10*3/uL — CL (ref 150–450)
RBC: 4.55 x10E6/uL (ref 4.14–5.80)
RDW: 12.8 % (ref 11.6–15.4)
WBC: 61.7 10*3/uL (ref 3.4–10.8)

## 2018-09-23 NOTE — Telephone Encounter (Signed)
Patient left voice mail: patient of Dr. Beryle Beams being transitioned to Dr.Kale. Stated no one had contacted him about an appointment and he was following up. Reviewed chart - new patient referral sent in fall 2019 referring patient to Dr. Irene Limbo or Maylon Peppers in February or March of 2020.   Contacted new patient scheduling, left voice mail that patient had called seeking appt.  Contacted patient to inform him that new patient scheduling was contacted and informed that patient is seeking appt.  Advised patient that scheduling will contact him. Patient verbalized understanding.

## 2018-09-24 ENCOUNTER — Telehealth: Payer: Self-pay | Admitting: *Deleted

## 2018-09-24 NOTE — Telephone Encounter (Signed)
Pt called / informed "white count slowly rising but red count and platelets stable so still no indication to start treatment" per Dr Beryle Beams. Stated the cancer ctr did call him back and will call back at a later date to schedule an appt w/Dr Irene Limbo.

## 2018-09-24 NOTE — Telephone Encounter (Signed)
-----   Message from Annia Belt, MD sent at 09/23/2018 11:02 AM EST ----- CAll pt: white count slowly rising but red count and platelets stable so still no indication to start treatment

## 2018-10-09 ENCOUNTER — Encounter: Payer: Self-pay | Admitting: Hematology

## 2018-10-09 ENCOUNTER — Telehealth: Payer: Self-pay | Admitting: Hematology

## 2018-10-09 NOTE — Telephone Encounter (Signed)
Cld and scheduled Thomas Zavala to see Dr. Irene Limbo on 3/4 at 11am w/labs at 1030am. Thomas Zavala and his daughter agreed to the appt date and time. Letter mailed.

## 2018-10-19 ENCOUNTER — Encounter: Payer: Self-pay | Admitting: *Deleted

## 2018-10-20 DIAGNOSIS — C44319 Basal cell carcinoma of skin of other parts of face: Secondary | ICD-10-CM | POA: Diagnosis not present

## 2018-10-20 DIAGNOSIS — L570 Actinic keratosis: Secondary | ICD-10-CM | POA: Diagnosis not present

## 2018-10-20 DIAGNOSIS — X32XXXD Exposure to sunlight, subsequent encounter: Secondary | ICD-10-CM | POA: Diagnosis not present

## 2018-10-20 DIAGNOSIS — C44329 Squamous cell carcinoma of skin of other parts of face: Secondary | ICD-10-CM | POA: Diagnosis not present

## 2018-10-21 ENCOUNTER — Inpatient Hospital Stay: Payer: PPO | Attending: Hematology | Admitting: Hematology

## 2018-10-21 ENCOUNTER — Telehealth: Payer: Self-pay | Admitting: *Deleted

## 2018-10-21 ENCOUNTER — Other Ambulatory Visit: Payer: Self-pay | Admitting: *Deleted

## 2018-10-21 ENCOUNTER — Inpatient Hospital Stay: Payer: PPO

## 2018-10-21 ENCOUNTER — Telehealth: Payer: Self-pay | Admitting: Hematology

## 2018-10-21 VITALS — BP 167/81 | HR 67 | Temp 98.0°F | Resp 18 | Ht 72.0 in | Wt 185.0 lb

## 2018-10-21 DIAGNOSIS — Z87891 Personal history of nicotine dependence: Secondary | ICD-10-CM | POA: Diagnosis not present

## 2018-10-21 DIAGNOSIS — D696 Thrombocytopenia, unspecified: Secondary | ICD-10-CM | POA: Diagnosis not present

## 2018-10-21 DIAGNOSIS — R918 Other nonspecific abnormal finding of lung field: Secondary | ICD-10-CM | POA: Diagnosis not present

## 2018-10-21 DIAGNOSIS — C9110 Chronic lymphocytic leukemia of B-cell type not having achieved remission: Secondary | ICD-10-CM | POA: Insufficient documentation

## 2018-10-21 DIAGNOSIS — E119 Type 2 diabetes mellitus without complications: Secondary | ICD-10-CM

## 2018-10-21 LAB — CBC WITH DIFFERENTIAL (CANCER CENTER ONLY)
ABS IMMATURE GRANULOCYTES: 0.06 10*3/uL (ref 0.00–0.07)
Basophils Absolute: 0 10*3/uL (ref 0.0–0.1)
Basophils Relative: 0 %
Eosinophils Absolute: 0.1 10*3/uL (ref 0.0–0.5)
Eosinophils Relative: 0 %
HCT: 45.4 % (ref 39.0–52.0)
Hemoglobin: 15.5 g/dL (ref 13.0–17.0)
Immature Granulocytes: 0 %
LYMPHS ABS: 46.5 10*3/uL — AB (ref 0.7–4.0)
Lymphocytes Relative: 90 %
MCH: 33.5 pg (ref 26.0–34.0)
MCHC: 34.1 g/dL (ref 30.0–36.0)
MCV: 98.3 fL (ref 80.0–100.0)
Monocytes Absolute: 1.9 10*3/uL — ABNORMAL HIGH (ref 0.1–1.0)
Monocytes Relative: 4 %
NEUTROS ABS: 2.9 10*3/uL (ref 1.7–7.7)
Neutrophils Relative %: 6 %
Platelet Count: 73 10*3/uL — ABNORMAL LOW (ref 150–400)
RBC: 4.62 MIL/uL (ref 4.22–5.81)
RDW: 13.7 % (ref 11.5–15.5)
WBC Count: 51.5 10*3/uL (ref 4.0–10.5)
nRBC: 0 % (ref 0.0–0.2)

## 2018-10-21 LAB — COMPREHENSIVE METABOLIC PANEL
ALT: 20 U/L (ref 0–44)
ANION GAP: 8 (ref 5–15)
AST: 24 U/L (ref 15–41)
Albumin: 4.5 g/dL (ref 3.5–5.0)
Alkaline Phosphatase: 80 U/L (ref 38–126)
BUN: 12 mg/dL (ref 8–23)
CO2: 30 mmol/L (ref 22–32)
Calcium: 9.6 mg/dL (ref 8.9–10.3)
Chloride: 94 mmol/L — ABNORMAL LOW (ref 98–111)
Creatinine, Ser: 1.32 mg/dL — ABNORMAL HIGH (ref 0.61–1.24)
GFR calc Af Amer: 55 mL/min — ABNORMAL LOW (ref >60)
GFR, EST NON AFRICAN AMERICAN: 48 mL/min — AB (ref >60)
Glucose, Bld: 110 mg/dL — ABNORMAL HIGH (ref 70–99)
Potassium: 4.3 mmol/L (ref 3.5–5.1)
Sodium: 132 mmol/L — ABNORMAL LOW (ref 135–145)
Total Bilirubin: 1.2 mg/dL (ref 0.3–1.2)
Total Protein: 6.8 g/dL (ref 6.5–8.1)

## 2018-10-21 LAB — LACTATE DEHYDROGENASE: LDH: 118 U/L (ref 98–192)

## 2018-10-21 NOTE — Telephone Encounter (Signed)
Thomas Zavala. call report.  Today's WBC = 51.5.  Scheduled provider F/U today at 11:00 am as 'New Hematology'.

## 2018-10-21 NOTE — Telephone Encounter (Signed)
Gave avs and calendar ° °

## 2018-10-21 NOTE — Progress Notes (Signed)
HEMATOLOGY/ONCOLOGY CONSULTATION NOTE  Date of Service: 10/21/2018  Patient Care Team: Corrington, Delsa Grana, MD as PCP - General (Family Medicine)  CHIEF COMPLAINTS/PURPOSE OF CONSULTATION:  Chronic Lymphocytic Leukemia  HISTORY OF PRESENTING ILLNESS:   Thomas Zavala is a wonderful 85 y.o. male who has been referred to [place] by Bradly Bienenstock, NP for evaluation and management of his Chronic Lymphocytic Leukemia. The patient has been seen by Dr. Murriel Hopper in the past. He is accompanied today by his daughter. The pt reports that he is doing well overall.   The pt reports that he had ease of bruising in 2017 at which time he sought out a physical and was observed to have lymphocytosis and was diagnosed with CLL in November 2017. The pt denies having any other symptoms at that time. The pt has not had treatment for his CLL.  The pt notes that he has had some sweating at night between his legs, and feelings of flushness, which he notes started "in the last week or so." The pt denies fevers, chills, and noticing any new lumps or bumps. He has had recurrent squamous cell carcinomas for "several years," in sun-exposed areas. He follows with his dermatologist regularly for skin screenings.  The pt notes that his energy levels have diminished somewhat over the last 3 years, but denies fatigue being a limiting factor. He recently went on a 2 mile hike with his daughter. He has a one level living arrangement. The pt denies frequent or recurrent infections.   The pt notes that he follows with Dr. Gloriann Loan in urology for occasional hematuria. He notes that his DM and blood pressure have been well controlled.   The pt notes that he has had both of his pneumonia vaccines in the last 5 years and received his flu shot this season.  Most recent lab results (10/21/18) of CBC w/diff is as follows: all values are WNL except for WBC at WBC at 51.5k, PLT at 73k, Lymphs abs at 46.5k, Monocytes abs at 1.9k. 10/21/18 CMP  is pending 10/21/18 LDH is pending  On review of systems, pt reports staying active, eating well, mildly lower energy levels, and denies drenching night sweats, fevers, new fatigue, chills, unexpected weight loss, frequent infections, recurrent infections, leg swelling, abdominal pains, and any other symptoms.  On PMHx the pt reports CLL, DM type II.   MEDICAL HISTORY:  Past Medical History:  Diagnosis Date  . BPH (benign prostatic hyperplasia) 07/25/2016  . Type 2 diabetes mellitus without complication, without long-term current use of insulin ([place]) 07/25/2016    SURGICAL HISTORY: No past surgical history on file.  SOCIAL HISTORY: Social History   Socioeconomic History  . Marital status: Widowed    Spouse name: Not on file  . Number of children: Not on file  . Years of education: Not on file  . Highest education level: Not on file  Occupational History  . Not on file  Social Needs  . Financial resource strain: Not on file  . Food insecurity:    Worry: Not on file    Inability: Not on file  . Transportation needs:    Medical: Not on file    Non-medical: Not on file  Tobacco Use  . Smoking status: Former Smoker    Years: 25.00  . Smokeless tobacco: Never Used  Substance and Sexual Activity  . Alcohol use: Yes    Comment: socially.  . Drug use: No  . Sexual activity: Not on file  Lifestyle  . Physical activity:    Days per week: Not on file    Minutes per session: Not on file  . Stress: Not on file  Relationships  . Social connections:    Talks on phone: Not on file    Gets together: Not on file    Attends religious service: Not on file    Active member of club or organization: Not on file    Attends meetings of clubs or organizations: Not on file    Relationship status: Not on file  . Intimate partner violence:    Fear of current or ex partner: Not on file    Emotionally abused: Not on file    Physically abused: Not on file    Forced sexual activity: Not on  file  Other Topics Concern  . Not on file  Social History Narrative  . Not on file    FAMILY HISTORY: No family history on file.  ALLERGIES:  has No Known Allergies.  MEDICATIONS:  Current Outpatient Medications  Medication Sig Dispense Refill  . losartan-hydrochlorothiazide (HYZAAR) 100-25 MG tablet Take by mouth.    . metFORMIN (GLUCOPHAGE-XR) 500 MG 24 hr tablet Take by mouth.    . metoprolol (LOPRESSOR) 50 MG tablet Take by mouth.    . pravastatin (PRAVACHOL) 40 MG tablet Take by mouth.     No current facility-administered medications for this visit.     REVIEW OF SYSTEMS:    10 Point review of Systems was done is negative except as noted above.  PHYSICAL EXAMINATION: ECOG PERFORMANCE STATUS: 2 - Symptomatic, <50% confined to bed  . Vitals:   10/21/18 1117  BP: (!) 167/81  Pulse: 67  Resp: 18  Temp: 98 F (36.7 C)  SpO2: 97%   Filed Weights   10/21/18 1117  Weight: 185 lb (83.9 kg)   .Body mass index is 25.09 kg/m.  GENERAL:alert, in no acute distress and comfortable SKIN: no acute rashes, no significant lesions EYES: conjunctiva are pink and non-injected, sclera anicteric OROPHARYNX: MMM, no exudates, no oropharyngeal erythema or ulceration NECK: supple, no JVD LYMPH: no palpable lymphadenopathy in the cervical, axillary or inguinal regions LUNGS: clear to auscultation b/l with normal respiratory effort HEART: regular rate & rhythm ABDOMEN:  normoactive bowel sounds , non tender, not distended. Palpable splenomegaly 2 fingerbreadths beneath left costal margin. Extremity: no pedal edema PSYCH: alert & oriented x 3 with fluent speech NEURO: no focal motor/sensory deficits  LABORATORY DATA:  I have reviewed the data as listed  . CBC Latest Ref Rng & Units 10/21/2018 09/22/2018 07/07/2018  WBC 4.0 - 10.5 K/uL 51.5(HH) 61.7(HH) 47.3(HH)  Hemoglobin 13.0 - 17.0 g/dL 15.5 15.4 14.3  Hematocrit 39.0 - 52.0 % 45.4 44.3 43.5  Platelets 150 - 400 K/uL 73(L)  83(LL) 73(LL)    . CMP Latest Ref Rng & Units 07/07/2018 03/16/2018 09/16/2017  Glucose 65 - 99 mg/dL 160(H) 123(H) 148(H)  BUN 8 - 27 mg/dL 11 7(L) 12  Creatinine 0.76 - 1.27 mg/dL 1.07 0.97 1.15  Sodium 134 - 144 mmol/L 135 131(L) 134(L)  Potassium 3.5 - 5.2 mmol/L 3.6 4.4 4.8  Chloride 96 - 106 mmol/L 92(L) 88(L) 95(L)  CO2 20 - 29 mmol/L 24 26 28   Calcium 8.6 - 10.2 mg/dL 9.3 9.2 9.3  Total Protein 6.0 - 8.5 g/dL 6.2 6.2 6.6  Total Bilirubin 0.0 - 1.2 mg/dL 0.7 1.0 1.2  Alkaline Phos 39 - 117 IU/L 87 91 92  AST 0 -  40 IU/L 20 18 29   ALT 0 - 44 IU/L 12 10 13(L)    07/15/16 Cytogenetics and Flow Cytometry:    RADIOGRAPHIC STUDIES: I have personally reviewed the radiological images as listed and agreed with the findings in the report. No results found.  ASSESSMENT & PLAN:  85 y.o. male with  1. Chronic Lymphocytic Leukemia Diagnosed in November 2017 with flow cytometry 07/15/16 Cytogenetics revealed Trisomy 12  09/03/16 CT A/P revealed Borderline splenomegaly with 1.3 cm low-attenuation lesion in the anterior aspect of spleen. Mild upper abdominal and right iliac lymphadenopathy. 4 mm indeterminate pulmonary nodule in right lung base. Recommend continued attention on follow-up CT. Incidentally noted benign right hepatic lobe hemangioma, mildly and enlarged prostate, and colonic diverticulosis  10/10/17 CT Chest revealed Scattered small bilateral pulmonary nodules, including two dominant right middle lobe nodules measuring 5-6 mm, unchanged, likely benign. If clinically warranted, consider a single follow-up CT chest in 1 year. Mild mediastinal and bilateral hilar lymphadenopathy, likely related to the patient's known CLL. Mild splenomegaly, incompletely visualized. Aortic Atherosclerosis and Emphysema  PLAN: -Discussed patient's most recent labs from 10/21/18, WBC at 51.5k, PLT at 73k, HGB normal at 15.5. Thrombocytopenia most likely related to splenomegaly vs immune -WBC were in  30k range about one year ago, HGB has remained normal. PLT were 90k roughly a year ago, and have fluctuated. -Discussed the criteria to consider initiating treatment including cytopenias, constitutional symptoms, and organ threat. Pt does meet some criteria to consider initiating treatment. -Reviewed the constitutional symptoms to be mindful of with the pt including fevers without other etiology, chills, drenching night sweats, new fatigue, and 10% loss of body weight -Pt reports he is up to date with flu vaccine, Prevnar, and Pneumovax -Will see the pt back in 3 months, sooner if any new concerns   RTC with dr Irene Limbo with labs in 3 months   All of the patients questions were answered with apparent satisfaction. The patient knows to call the clinic with any problems, questions or concerns.  The total time spent in the appt was 45 minutes and more than 50% was on counseling and direct patient cares.     Sullivan Lone MD MS AAHIVMS [facility] Hematology/Oncology Physician [facility]  (Office):       [phone] (Work cell):  [phone] (Fax):           [phone]  10/21/2018 12:07 PM  I, Baldwin Jamaica, am acting as a scribe for Dr. Sullivan Lone.   .I have reviewed the above documentation for accuracy and completeness, and I agree with the above. Brunetta Genera MD

## 2018-10-23 DIAGNOSIS — H524 Presbyopia: Secondary | ICD-10-CM | POA: Diagnosis not present

## 2018-10-23 DIAGNOSIS — H35033 Hypertensive retinopathy, bilateral: Secondary | ICD-10-CM | POA: Diagnosis not present

## 2018-10-23 DIAGNOSIS — H02135 Senile ectropion of left lower eyelid: Secondary | ICD-10-CM | POA: Diagnosis not present

## 2018-10-23 DIAGNOSIS — E119 Type 2 diabetes mellitus without complications: Secondary | ICD-10-CM | POA: Diagnosis not present

## 2018-10-23 DIAGNOSIS — H2513 Age-related nuclear cataract, bilateral: Secondary | ICD-10-CM | POA: Diagnosis not present

## 2018-10-26 DIAGNOSIS — H2513 Age-related nuclear cataract, bilateral: Secondary | ICD-10-CM | POA: Diagnosis not present

## 2018-10-26 DIAGNOSIS — H25013 Cortical age-related cataract, bilateral: Secondary | ICD-10-CM | POA: Insufficient documentation

## 2018-10-26 DIAGNOSIS — H18519 Endothelial corneal dystrophy, unspecified eye: Secondary | ICD-10-CM | POA: Insufficient documentation

## 2018-10-26 DIAGNOSIS — H353131 Nonexudative age-related macular degeneration, bilateral, early dry stage: Secondary | ICD-10-CM | POA: Diagnosis not present

## 2018-10-26 DIAGNOSIS — H1851 Endothelial corneal dystrophy: Secondary | ICD-10-CM | POA: Diagnosis not present

## 2018-10-26 DIAGNOSIS — H02122 Mechanical ectropion of right lower eyelid: Secondary | ICD-10-CM | POA: Diagnosis not present

## 2018-10-26 DIAGNOSIS — H02125 Mechanical ectropion of left lower eyelid: Secondary | ICD-10-CM | POA: Diagnosis not present

## 2019-01-19 NOTE — Progress Notes (Signed)
HEMATOLOGY/ONCOLOGY CONSULTATION NOTE  Date of Service: 01/20/2019  Patient Care Team: Corrington, Delsa Grana, MD as PCP - General (Family Medicine)  CHIEF COMPLAINTS/PURPOSE OF CONSULTATION:  Chronic Lymphocytic Leukemia  HISTORY OF PRESENTING ILLNESS:   Thomas Zavala is a wonderful 85 y.o. male who has been referred to [place] by Bradly Bienenstock, NP for evaluation and management of his Chronic Lymphocytic Leukemia. The patient has been seen by Dr. Murriel Hopper in the past. He is accompanied today by his daughter. The pt reports that he is doing well overall.   The pt reports that he had ease of bruising in 2017 at which time he sought out a physical and was observed to have lymphocytosis and was diagnosed with CLL in November 2017. The pt denies having any other symptoms at that time. The pt has not had treatment for his CLL.  The pt notes that he has had some sweating at night between his legs, and feelings of flushness, which he notes started "in the last week or so." The pt denies fevers, chills, and noticing any new lumps or bumps. He has had recurrent squamous cell carcinomas for "several years," in sun-exposed areas. He follows with his dermatologist regularly for skin screenings.  The pt notes that his energy levels have diminished somewhat over the last 3 years, but denies fatigue being a limiting factor. He recently went on a 2 mile hike with his daughter. He has a one level living arrangement. The pt denies frequent or recurrent infections.   The pt notes that he follows with Dr. Gloriann Loan in urology for occasional hematuria. He notes that his DM and blood pressure have been well controlled.   The pt notes that he has had both of his pneumonia vaccines in the last 5 years and received his flu shot this season.  Most recent lab results (10/21/18) of CBC w/diff is as follows: all values are WNL except for WBC at WBC at 51.5k, PLT at 73k, Lymphs abs at 46.5k, Monocytes abs at 1.9k. 10/21/18 CMP  is pending 10/21/18 LDH is pending  On review of systems, pt reports staying active, eating well, mildly lower energy levels, and denies drenching night sweats, fevers, new fatigue, chills, unexpected weight loss, frequent infections, recurrent infections, leg swelling, abdominal pains, and any other symptoms.  On PMHx the pt reports CLL, DM type II.  Interval History:   Thomas Zavala returns today for management and evaluation of his CLL. The patient's last visit with [place] was on 10/21/18. The pt reports that he is doing well overall.  The pt reports that he has had occasional, infrequent moments of mild nausea. He denies associating this with anything in particular. He denies a sense of abdominal fullness. The pt continues on Metformin and notes that he doesn't check his blood sugars as frequently as he used to.  The pt notes that he has had stable energy levels. He denies any new fatigue, fevers, chills, or night sweats. He notes that he feels similarly today as he did at our last visit. The pt denies light headedness or dizziness. He is eating well and has gained a few pounds. He denies noticing any new lumps or bumps and denies abdominal pains.  The pt notes that his "legs are weaker," and attributes this to "getting old." He denies his legs giving away nor falls. He notes that he does not want to use a cane.   He notes that he feels that he flushes with warmth early  in the mornings sometimes, "like a warm glow all over." He notes that this feeling resolves on its own. He notes that this feeling makes him sweat "just a little bit."  The pt notes that he bruises somewhat easily but denies concerns for bleeding.  He is using topical 5-FU for his skin SCCs currently. He sees Dr. Nevada Crane in Dermatology.  Lab results today (01/20/19) of CBC w/diff, Reticulocytes, and CMP is as follows: all values are WNL except for WBC at 60.0k, PLT at 73k, Lymphs abs at 55.1k, Monocytes at 1.9k, Basophils at 200, Sodium  at 130, Chloride at 93, Glucose at 150, Calcium at 8.7, Total Protein at 6.2, GFR at 55. 01/20/19 LDH at 138 01/20/19 Immature PLT fraction is at 5.0%  On review of systems, pt reports stable energy levels, flushing feeling in mornings, infrequent mild nausea, eating well, mild weight gain, sense of legs being weaker, not hydrating well, and denies abdominal fullness, new fatigue, fevers, chills, night sweats, unexpected weight loss, light headedness, dizziness, concerns for bleeding, abdominal pains, new lumps or bumps, cough, headaches, concerns for infections, and any other symptoms.   MEDICAL HISTORY:  Past Medical History:  Diagnosis Date  . BPH (benign prostatic hyperplasia) 07/25/2016  . Type 2 diabetes mellitus without complication, without long-term current use of insulin ([place]) 07/25/2016    SURGICAL HISTORY: No past surgical history on file.  SOCIAL HISTORY: Social History   Socioeconomic History  . Marital status: Widowed    Spouse name: Not on file  . Number of children: Not on file  . Years of education: Not on file  . Highest education level: Not on file  Occupational History  . Not on file  Social Needs  . Financial resource strain: Not on file  . Food insecurity:    Worry: Not on file    Inability: Not on file  . Transportation needs:    Medical: Not on file    Non-medical: Not on file  Tobacco Use  . Smoking status: Former Smoker    Years: 25.00  . Smokeless tobacco: Never Used  Substance and Sexual Activity  . Alcohol use: Yes    Comment: socially.  . Drug use: No  . Sexual activity: Not on file  Lifestyle  . Physical activity:    Days per week: Not on file    Minutes per session: Not on file  . Stress: Not on file  Relationships  . Social connections:    Talks on phone: Not on file    Gets together: Not on file    Attends religious service: Not on file    Active member of club or organization: Not on file    Attends meetings of clubs or  organizations: Not on file    Relationship status: Not on file  . Intimate partner violence:    Fear of current or ex partner: Not on file    Emotionally abused: Not on file    Physically abused: Not on file    Forced sexual activity: Not on file  Other Topics Concern  . Not on file  Social History Narrative  . Not on file    FAMILY HISTORY: No family history on file.  ALLERGIES:  has No Known Allergies.  MEDICATIONS:  Current Outpatient Medications  Medication Sig Dispense Refill  . losartan-hydrochlorothiazide (HYZAAR) 100-25 MG tablet Take by mouth.    . metFORMIN (GLUCOPHAGE-XR) 500 MG 24 hr tablet Take by mouth.    . metoprolol (LOPRESSOR) 50  MG tablet Take by mouth.    . pravastatin (PRAVACHOL) 40 MG tablet Take by mouth.     No current facility-administered medications for this visit.     REVIEW OF SYSTEMS:    A 10+ POINT REVIEW OF SYSTEMS WAS OBTAINED including neurology, dermatology, psychiatry, cardiac, respiratory, lymph, extremities, GI, GU, Musculoskeletal, constitutional, breasts, reproductive, HEENT.  All pertinent positives are noted in the HPI.  All others are negative.  PHYSICAL EXAMINATION: ECOG PERFORMANCE STATUS: 2 - Symptomatic, <50% confined to bed  . Vitals:   01/20/19 1142  BP: 140/69  Pulse: 75  Resp: 17  Temp: 98.6 F (37 C)  SpO2: 97%   Filed Weights   01/20/19 1142  Weight: 189 lb 6.4 oz (85.9 kg)   .Body mass index is 25.69 kg/m. [name] GENERAL:alert, in no acute distress and comfortable SKIN: no acute rashes, no significant lesions EYES: conjunctiva are pink and non-injected, sclera anicteric OROPHARYNX: MMM, no exudates, no oropharyngeal erythema or ulceration NECK: supple, no JVD LYMPH:  no palpable lymphadenopathy in the cervical, axillary or inguinal regions LUNGS: clear to auscultation b/l with normal respiratory effort HEART: regular rate & rhythm ABDOMEN:  normoactive bowel sounds , non tender, not distended. Palpable  splenomegaly 1 fingerbreadth beneath left costal margin, Extremity: no pedal edema PSYCH: alert & oriented x 3 with fluent speech NEURO: no focal motor/sensory deficits   LABORATORY DATA:  I have reviewed the data as listed  . CBC Latest Ref Rng & Units 01/20/2019 10/21/2018 09/22/2018  WBC 4.0 - 10.5 K/uL 60.0(HH) 51.5(HH) 61.7(HH)  Hemoglobin 13.0 - 17.0 g/dL 14.5 15.5 15.4  Hematocrit 39.0 - 52.0 % 42.3 45.4 44.3  Platelets 150 - 400 K/uL 73(L) 73(L) 83(LL)    . CMP Latest Ref Rng & Units 01/20/2019 10/21/2018 07/07/2018  Glucose 70 - 99 mg/dL 150(H) 110(H) 160(H)  BUN 8 - 23 mg/dL 11 12 11   Creatinine 0.61 - 1.24 mg/dL 1.17 1.32(H) 1.07  Sodium 135 - 145 mmol/L 130(L) 132(L) 135  Potassium 3.5 - 5.1 mmol/L 3.8 4.3 3.6  Chloride 98 - 111 mmol/L 93(L) 94(L) 92(L)  CO2 22 - 32 mmol/L 27 30 24   Calcium 8.9 - 10.3 mg/dL 8.7(L) 9.6 9.3  Total Protein 6.5 - 8.1 g/dL 6.2(L) 6.8 6.2  Total Bilirubin 0.3 - 1.2 mg/dL 1.2 1.2 0.7  Alkaline Phos 38 - 126 U/L 89 80 87  AST 15 - 41 U/L 21 24 20   ALT 0 - 44 U/L 15 20 12     07/15/16 Cytogenetics and Flow Cytometry:    RADIOGRAPHIC STUDIES: I have personally reviewed the radiological images as listed and agreed with the findings in the report. No results found.  ASSESSMENT & PLAN:  85 y.o. male with  1. Chronic Lymphocytic Leukemia Diagnosed in November 2017 with flow cytometry 07/15/16 Cytogenetics revealed Trisomy 12  09/03/16 CT A/P revealed Borderline splenomegaly with 1.3 cm low-attenuation lesion in the anterior aspect of spleen. Mild upper abdominal and right iliac lymphadenopathy. 4 mm indeterminate pulmonary nodule in right lung base. Recommend continued attention on follow-up CT. Incidentally noted benign right hepatic lobe hemangioma, mildly and enlarged prostate, and colonic diverticulosis  10/10/17 CT Chest revealed Scattered small bilateral pulmonary nodules, including two dominant right middle lobe nodules measuring 5-6 mm,  unchanged, likely benign. If clinically warranted, consider a single follow-up CT chest in 1 year. Mild mediastinal and bilateral hilar lymphadenopathy, likely related to the patient's known CLL. Mild splenomegaly, incompletely visualized. Aortic Atherosclerosis and Emphysema  PLAN: --  Discussed pt labwork today, 01/20/19; WBC increased from 51.5k three months ago to 60.0k today. PLT stable at 73k and immature PLT fraction is WNL at 5.0%. Sodium at 130. Other blood chemistries are stable. LDH at 138. -Recommend discussing low sodium levels with PCP and diuretic management in the next 1-2 weeks. -Discussed that the patient's PLT are low but have been stable. Likely related to splenomegaly vs immune -Discussed again the criteria to consider initiating treatment including cytopenias, constitutional symptoms, and organ threat. Pt does meet some criteria to consider initiating treatment. Discussed possible treatments and their side effects. Pt prefers continuing with a conservative route of watchful observation, and would not like to begin treatment at this time. -Reviewed the constitutional symptoms to be mindful of with the pt including fevers without other etiology, chills, drenching night sweats, new fatigue, and 10% loss of body weight -Pt reports he is up to date with flu vaccine, Prevnar, and Pneumovax -Recommend PCP consider PT referral for lower extremity weakness  -Will see the pt back in 3 months, sooner if any new concerns   RTC with Dr Irene Limbo with labs in 3 months   All of the patients questions were answered with apparent satisfaction. The patient knows to call the clinic with any problems, questions or concerns.  The total time spent in the appt was 30 minutes and more than 50% was on counseling and direct patient cares.    Sullivan Lone MD MS AAHIVMS [facility] Hematology/Oncology Physician [facility]  (Office):       [phone] (Work cell):  [phone] (Fax):            [phone]  01/20/2019 12:53 PM  I, Baldwin Jamaica, am acting as a scribe for Dr. Sullivan Lone.   .I have reviewed the above documentation for accuracy and completeness, and I agree with the above. Brunetta Genera MD

## 2019-01-20 ENCOUNTER — Inpatient Hospital Stay: Payer: PPO | Attending: Hematology | Admitting: Hematology

## 2019-01-20 ENCOUNTER — Inpatient Hospital Stay: Payer: PPO

## 2019-01-20 ENCOUNTER — Telehealth: Payer: Self-pay | Admitting: Hematology

## 2019-01-20 ENCOUNTER — Other Ambulatory Visit: Payer: Self-pay

## 2019-01-20 VITALS — BP 140/69 | HR 75 | Temp 98.6°F | Resp 17 | Ht 72.0 in | Wt 189.4 lb

## 2019-01-20 DIAGNOSIS — D696 Thrombocytopenia, unspecified: Secondary | ICD-10-CM

## 2019-01-20 DIAGNOSIS — Z85828 Personal history of other malignant neoplasm of skin: Secondary | ICD-10-CM | POA: Diagnosis not present

## 2019-01-20 DIAGNOSIS — R590 Localized enlarged lymph nodes: Secondary | ICD-10-CM

## 2019-01-20 DIAGNOSIS — E119 Type 2 diabetes mellitus without complications: Secondary | ICD-10-CM | POA: Insufficient documentation

## 2019-01-20 DIAGNOSIS — Z87891 Personal history of nicotine dependence: Secondary | ICD-10-CM | POA: Insufficient documentation

## 2019-01-20 DIAGNOSIS — R61 Generalized hyperhidrosis: Secondary | ICD-10-CM | POA: Diagnosis not present

## 2019-01-20 DIAGNOSIS — J439 Emphysema, unspecified: Secondary | ICD-10-CM | POA: Diagnosis not present

## 2019-01-20 DIAGNOSIS — R918 Other nonspecific abnormal finding of lung field: Secondary | ICD-10-CM | POA: Diagnosis not present

## 2019-01-20 DIAGNOSIS — N400 Enlarged prostate without lower urinary tract symptoms: Secondary | ICD-10-CM | POA: Diagnosis not present

## 2019-01-20 DIAGNOSIS — C9110 Chronic lymphocytic leukemia of B-cell type not having achieved remission: Secondary | ICD-10-CM | POA: Insufficient documentation

## 2019-01-20 DIAGNOSIS — R161 Splenomegaly, not elsewhere classified: Secondary | ICD-10-CM | POA: Diagnosis not present

## 2019-01-20 DIAGNOSIS — Z79899 Other long term (current) drug therapy: Secondary | ICD-10-CM | POA: Insufficient documentation

## 2019-01-20 DIAGNOSIS — I700 Atherosclerosis of aorta: Secondary | ICD-10-CM | POA: Diagnosis not present

## 2019-01-20 DIAGNOSIS — R319 Hematuria, unspecified: Secondary | ICD-10-CM | POA: Diagnosis not present

## 2019-01-20 LAB — CBC WITH DIFFERENTIAL/PLATELET
Abs Immature Granulocytes: 0.06 10*3/uL (ref 0.00–0.07)
Basophils Absolute: 0.2 10*3/uL — ABNORMAL HIGH (ref 0.0–0.1)
Basophils Relative: 0 %
Eosinophils Absolute: 0.1 10*3/uL (ref 0.0–0.5)
Eosinophils Relative: 0 %
HCT: 42.3 % (ref 39.0–52.0)
Hemoglobin: 14.5 g/dL (ref 13.0–17.0)
Immature Granulocytes: 0 %
Lymphocytes Relative: 93 %
Lymphs Abs: 55.1 10*3/uL — ABNORMAL HIGH (ref 0.7–4.0)
MCH: 33.6 pg (ref 26.0–34.0)
MCHC: 34.3 g/dL (ref 30.0–36.0)
MCV: 97.9 fL (ref 80.0–100.0)
Monocytes Absolute: 1.9 10*3/uL — ABNORMAL HIGH (ref 0.1–1.0)
Monocytes Relative: 3 %
Neutro Abs: 2.5 10*3/uL (ref 1.7–7.7)
Neutrophils Relative %: 4 %
Platelets: 73 10*3/uL — ABNORMAL LOW (ref 150–400)
RBC: 4.32 MIL/uL (ref 4.22–5.81)
RDW: 13.2 % (ref 11.5–15.5)
WBC: 60 10*3/uL (ref 4.0–10.5)
nRBC: 0 % (ref 0.0–0.2)

## 2019-01-20 LAB — RETICULOCYTES
Immature Retic Fract: 11.3 % (ref 2.3–15.9)
RBC.: 4.33 MIL/uL (ref 4.22–5.81)
Retic Count, Absolute: 119.9 10*3/uL (ref 19.0–186.0)
Retic Ct Pct: 2.8 % (ref 0.4–3.1)

## 2019-01-20 LAB — CMP (CANCER CENTER ONLY)
ALT: 15 U/L (ref 0–44)
AST: 21 U/L (ref 15–41)
Albumin: 4 g/dL (ref 3.5–5.0)
Alkaline Phosphatase: 89 U/L (ref 38–126)
Anion gap: 10 (ref 5–15)
BUN: 11 mg/dL (ref 8–23)
CO2: 27 mmol/L (ref 22–32)
Calcium: 8.7 mg/dL — ABNORMAL LOW (ref 8.9–10.3)
Chloride: 93 mmol/L — ABNORMAL LOW (ref 98–111)
Creatinine: 1.17 mg/dL (ref 0.61–1.24)
GFR, Est AFR Am: >60 mL/min (ref >60)
GFR, Estimated: 55 mL/min — ABNORMAL LOW (ref >60)
Glucose, Bld: 150 mg/dL — ABNORMAL HIGH (ref 70–99)
Potassium: 3.8 mmol/L (ref 3.5–5.1)
Sodium: 130 mmol/L — ABNORMAL LOW (ref 135–145)
Total Bilirubin: 1.2 mg/dL (ref 0.3–1.2)
Total Protein: 6.2 g/dL — ABNORMAL LOW (ref 6.5–8.1)

## 2019-01-20 LAB — LACTATE DEHYDROGENASE: LDH: 138 U/L (ref 98–192)

## 2019-01-20 LAB — IMMATURE PLATELET FRACTION: Immature Platelet Fraction: 5 % (ref 1.2–8.6)

## 2019-01-20 NOTE — Telephone Encounter (Signed)
Scheduled appt per 6/3 los. ° °A calendar will be mailed out. °

## 2019-01-26 DIAGNOSIS — R600 Localized edema: Secondary | ICD-10-CM | POA: Diagnosis not present

## 2019-02-17 DIAGNOSIS — L570 Actinic keratosis: Secondary | ICD-10-CM | POA: Diagnosis not present

## 2019-02-17 DIAGNOSIS — X32XXXD Exposure to sunlight, subsequent encounter: Secondary | ICD-10-CM | POA: Diagnosis not present

## 2019-02-18 ENCOUNTER — Telehealth: Payer: Self-pay | Admitting: *Deleted

## 2019-02-18 DIAGNOSIS — E119 Type 2 diabetes mellitus without complications: Secondary | ICD-10-CM | POA: Diagnosis not present

## 2019-02-18 DIAGNOSIS — Z79899 Other long term (current) drug therapy: Secondary | ICD-10-CM | POA: Diagnosis not present

## 2019-02-18 DIAGNOSIS — T50995A Adverse effect of other drugs, medicaments and biological substances, initial encounter: Secondary | ICD-10-CM | POA: Diagnosis not present

## 2019-02-18 DIAGNOSIS — L539 Erythematous condition, unspecified: Secondary | ICD-10-CM | POA: Diagnosis not present

## 2019-02-18 DIAGNOSIS — Z87891 Personal history of nicotine dependence: Secondary | ICD-10-CM | POA: Diagnosis not present

## 2019-02-18 DIAGNOSIS — T451X5A Adverse effect of antineoplastic and immunosuppressive drugs, initial encounter: Secondary | ICD-10-CM | POA: Diagnosis not present

## 2019-02-18 DIAGNOSIS — E785 Hyperlipidemia, unspecified: Secondary | ICD-10-CM | POA: Diagnosis not present

## 2019-02-18 DIAGNOSIS — I10 Essential (primary) hypertension: Secondary | ICD-10-CM | POA: Diagnosis not present

## 2019-02-18 DIAGNOSIS — R220 Localized swelling, mass and lump, head: Secondary | ICD-10-CM | POA: Diagnosis not present

## 2019-02-18 DIAGNOSIS — B09 Unspecified viral infection characterized by skin and mucous membrane lesions: Secondary | ICD-10-CM | POA: Diagnosis not present

## 2019-02-18 DIAGNOSIS — Z7984 Long term (current) use of oral hypoglycemic drugs: Secondary | ICD-10-CM | POA: Diagnosis not present

## 2019-02-18 NOTE — Telephone Encounter (Signed)
"  Deshawn Parkinson's daughter Karma Ganja [phone]).  My father is experiencing some Chicken Pox or Shingles like symptoms.  We spoke with his Dermatologist.  He was putting a chemical peel type drug we thought it might be a reaction to that but doing a virtual visit with her yesterday and speaking F/U today; the fact that he is running a fever of 101, we're concerned this is a shingles or some type of a viral infection of that nature.  We need to setup an appointment as soon as possible with Dr. Irene Limbo if you would call me back."

## 2019-02-18 NOTE — Telephone Encounter (Signed)
Daughter Lanelle Bal called. Spoke with patient and daughter. Patient has developed a raised red rash over chest, arms and back. Rash started today. Red dots are 4 mm-1 cm in size with a black dot in the center. Itches. No blistering. No pain. Temperature ranges between 100 and 101.1 F, decreases with tylenol.  Daughter reports father used creme prescribed by dermatologist to treat melanoma on Thursday of last week - face swelled after one use and he did not use again. Dermatologist notified by patient. Face continued swelling after use. Temperature started low on Tuesday and has persisted (see earlier note). Daughter thinks father may have not changed pillow case after first use. Patient had virtual visit with dermatologist on Wednesday (yesterday). Patient did not have rash at that time. Patient had elevated temperature and facial swelling. They called dermatologist back today when rash started. Dermatologist thought it might be a viral infection or possibly a reaction.They reached out to Dr. Irene Limbo since patient has CLL and is immunocompromised.  Dr. Irene Limbo given all information. He asked that daughter be contacted and told to take patient to ED for evaluation due to extent of rash and continued temperature. Daughter given these instructions. She verbalized understanding while expressing concerns regarding taking her father to the hospital.

## 2019-02-25 DIAGNOSIS — Z09 Encounter for follow-up examination after completed treatment for conditions other than malignant neoplasm: Secondary | ICD-10-CM | POA: Diagnosis not present

## 2019-02-25 DIAGNOSIS — D72825 Bandemia: Secondary | ICD-10-CM | POA: Diagnosis not present

## 2019-02-25 DIAGNOSIS — L511 Stevens-Johnson syndrome: Secondary | ICD-10-CM | POA: Diagnosis not present

## 2019-02-25 DIAGNOSIS — T7840XD Allergy, unspecified, subsequent encounter: Secondary | ICD-10-CM | POA: Diagnosis not present

## 2019-02-25 DIAGNOSIS — C9110 Chronic lymphocytic leukemia of B-cell type not having achieved remission: Secondary | ICD-10-CM | POA: Diagnosis not present

## 2019-03-04 DIAGNOSIS — L511 Stevens-Johnson syndrome: Secondary | ICD-10-CM | POA: Diagnosis not present

## 2019-04-21 NOTE — Progress Notes (Signed)
HEMATOLOGY/ONCOLOGY CONSULTATION NOTE  Date of Service: 04/22/2019  Patient Care Team: Corrington, Delsa Grana, MD as PCP - General (Family Medicine)  CHIEF COMPLAINTS/PURPOSE OF CONSULTATION:  Chronic Lymphocytic Leukemia  HISTORY OF PRESENTING ILLNESS:   Thomas Zavala is a wonderful 85 y.o. male who has been referred to [place] by Bradly Bienenstock, NP for evaluation and management of his Chronic Lymphocytic Leukemia. The patient has been seen by Dr. Murriel Hopper in the past. He is accompanied today by his daughter. The pt reports that he is doing well overall.   The pt reports that he had ease of bruising in 2017 at which time he sought out a physical and was observed to have lymphocytosis and was diagnosed with CLL in November 2017. The pt denies having any other symptoms at that time. The pt has not had treatment for his CLL.  The pt notes that he has had some sweating at night between his legs, and feelings of flushness, which he notes started "in the last week or so." The pt denies fevers, chills, and noticing any new lumps or bumps. He has had recurrent squamous cell carcinomas for "several years," in sun-exposed areas. He follows with his dermatologist regularly for skin screenings.  The pt notes that his energy levels have diminished somewhat over the last 3 years, but denies fatigue being a limiting factor. He recently went on a 2 mile hike with his daughter. He has a one level living arrangement. The pt denies frequent or recurrent infections.   The pt notes that he follows with Dr. Gloriann Loan in urology for occasional hematuria. He notes that his DM and blood pressure have been well controlled.   The pt notes that he has had both of his pneumonia vaccines in the last 5 years and received his flu shot this season.  Most recent lab results (10/21/18) of CBC w/diff is as follows: all values are WNL except for WBC at WBC at 51.5k, PLT at 73k, Lymphs abs at 46.5k, Monocytes abs at 1.9k. 10/21/18 CMP  is pending 10/21/18 LDH is pending  On review of systems, pt reports staying active, eating well, mildly lower energy levels, and denies drenching night sweats, fevers, new fatigue, chills, unexpected weight loss, frequent infections, recurrent infections, leg swelling, abdominal pains, and any other symptoms.  On PMHx the pt reports CLL, DM type II.   INTERVAL HISTORY:   Thomas Zavala returns today for management and evaluation of his CLL. The patient's last visit with [place] was on 01/20/2019. The pt reports that he is doing well overall.  The pt reports no acute new symptoms. No fevers/chills, night sweats.  Lab results today (04/22/19) of CBC w/diff and CMP is as follows: all values are WNL except for WBC at 51.7K, Platelets at 100K, Lymphs Abs at 46.6, Monocytes Abs at 1.8, Basophils Abs at 0.2, Sodium at 132, Chloride at 96, Glucose at 116, and total protein at 6.3. 04/22/19 LDH at 167, which is WNL.   MEDICAL HISTORY:  Past Medical History:  Diagnosis Date   BPH (benign prostatic hyperplasia) 07/25/2016   Type 2 diabetes mellitus without complication, without long-term current use of insulin ([place]) 07/25/2016    SURGICAL HISTORY: No past surgical history on file.  SOCIAL HISTORY: Social History   Socioeconomic History   Marital status: Widowed    Spouse name: Not on file   Number of children: Not on file   Years of education: Not on file   Highest education level: Not  on file  Occupational History   Not on file  Social Needs   Financial resource strain: Not on file   Food insecurity    Worry: Not on file    Inability: Not on file   Transportation needs    Medical: Not on file    Non-medical: Not on file  Tobacco Use   Smoking status: Former Smoker    Years: 25.00   Smokeless tobacco: Never Used  Substance and Sexual Activity   Alcohol use: Yes    Comment: socially.   Drug use: No   Sexual activity: Not on file  Lifestyle   Physical activity     Days per week: Not on file    Minutes per session: Not on file   Stress: Not on file  Relationships   Social connections    Talks on phone: Not on file    Gets together: Not on file    Attends religious service: Not on file    Active member of club or organization: Not on file    Attends meetings of clubs or organizations: Not on file    Relationship status: Not on file   Intimate partner violence    Fear of current or ex partner: Not on file    Emotionally abused: Not on file    Physically abused: Not on file    Forced sexual activity: Not on file  Other Topics Concern   Not on file  Social History Narrative   Not on file    FAMILY HISTORY: No family history on file.  ALLERGIES:  has No Known Allergies.  MEDICATIONS:  Current Outpatient Medications  Medication Sig Dispense Refill   losartan-hydrochlorothiazide (HYZAAR) 100-25 MG tablet Take by mouth.     metFORMIN (GLUCOPHAGE-XR) 500 MG 24 hr tablet Take by mouth.     metoprolol (LOPRESSOR) 50 MG tablet Take by mouth.     pravastatin (PRAVACHOL) 40 MG tablet Take by mouth.     No current facility-administered medications for this visit.     REVIEW OF SYSTEMS:   A 10+ POINT REVIEW OF SYSTEMS WAS OBTAINED including neurology, dermatology, psychiatry, cardiac, respiratory, lymph, extremities, GI, GU, Musculoskeletal, constitutional, breasts, reproductive, HEENT.  All pertinent positives are noted in the HPI.  All others are negative.    PHYSICAL EXAMINATION: ECOG PERFORMANCE STATUS: 2 - Symptomatic, <50% confined to bed  . Vitals:   04/22/19 1200  BP: (!) 172/75  Pulse: 73  Resp: 18  Temp: 99.2 F (37.3 C)  SpO2: 98%   Filed Weights   04/22/19 1200  Weight: 186 lb (84.4 kg)   Body mass index is 25.23 kg/m.  GENERAL:alert, in no acute distress and comfortable SKIN: no acute rashes, no significant lesions EYES: conjunctiva are pink and non-injected, sclera anicteric OROPHARYNX: MMM, no exudates,  no oropharyngeal erythema or ulceration NECK: supple, no JVD LYMPH:  no palpable lymphadenopathy in the cervical, axillary or inguinal regions LUNGS: clear to auscultation b/l with normal respiratory effort HEART: regular rate & rhythm ABDOMEN:  normoactive bowel sounds , non tender, not distended. Extremity: no pedal edema PSYCH: alert & oriented x 3 with fluent speech NEURO: no focal motor/sensory deficits    LABORATORY DATA:  I have reviewed the data as listed  . CBC Latest Ref Rng & Units 04/22/2019 01/20/2019 10/21/2018  WBC 4.0 - 10.5 K/uL 51.7(HH) 60.0(HH) 51.5(HH)  Hemoglobin 13.0 - 17.0 g/dL 14.1 14.5 15.5  Hematocrit 39.0 - 52.0 % 41.2 42.3 45.4  Platelets 150 -  400 K/uL 100(L) 73(L) 73(L)    . CMP Latest Ref Rng & Units 04/22/2019 01/20/2019 10/21/2018  Glucose 70 - 99 mg/dL 116(H) 150(H) 110(H)  BUN 8 - 23 mg/dL 11 11 12   Creatinine 0.61 - 1.24 mg/dL 1.07 1.17 1.32(H)  Sodium 135 - 145 mmol/L 132(L) 130(L) 132(L)  Potassium 3.5 - 5.1 mmol/L 4.1 3.8 4.3  Chloride 98 - 111 mmol/L 96(L) 93(L) 94(L)  CO2 22 - 32 mmol/L 27 27 30   Calcium 8.9 - 10.3 mg/dL 9.0 8.7(L) 9.6  Total Protein 6.5 - 8.1 g/dL 6.3(L) 6.2(L) 6.8  Total Bilirubin 0.3 - 1.2 mg/dL 1.0 1.2 1.2  Alkaline Phos 38 - 126 U/L 104 89 80  AST 15 - 41 U/L 18 21 24   ALT 0 - 44 U/L 12 15 20     07/15/16 Cytogenetics and Flow Cytometry:    RADIOGRAPHIC STUDIES: I have personally reviewed the radiological images as listed and agreed with the findings in the report. No results found.   ASSESSMENT & PLAN:  85 y.o. male with  1. Chronic Lymphocytic Leukemia Diagnosed in November 2017 with flow cytometry 07/15/16 Cytogenetics revealed Trisomy 12  09/03/16 CT A/P revealed Borderline splenomegaly with 1.3 cm low-attenuation lesion in the anterior aspect of spleen. Mild upper abdominal and right iliac lymphadenopathy. 4 mm indeterminate pulmonary nodule in right lung base. Recommend continued attention on follow-up CT.  Incidentally noted benign right hepatic lobe hemangioma, mildly and enlarged prostate, and colonic diverticulosis  10/10/17 CT Chest revealed Scattered small bilateral pulmonary nodules, including two dominant right middle lobe nodules measuring 5-6 mm, unchanged, likely benign. If clinically warranted, consider a single follow-up CT chest in 1 year. Mild mediastinal and bilateral hilar lymphadenopathy, likely related to the patient's known CLL. Mild splenomegaly, incompletely visualized. Aortic Atherosclerosis and Emphysema   PLAN: -Discussed pt labwork today, 04/22/19; all values are WNL except for WBC at 51.7K, Platelets at 100K, Lymphs Abs at 46.6, Monocytes Abs at 1.8, Basophils Abs at 0.2, Sodium at 132, Chloride at 96, Glucose at 116, and total protein at 6.3. 04/22/19 LDH at 167, which is WNL. - no lab or clinical evidence of symptomatic CLL progression at this time. - no indication for initiation of CLL treatment at this time.  FOLLOW UP: RTC with Dr Irene Limbo with labs in 3 months   The total time spent in the appt was 20 minutes and more than 50% was on counseling and direct patient cares.  All of the patient's questions were answered with apparent satisfaction. The patient knows to call the clinic with any problems, questions or concerns.     Sullivan Lone MD MS AAHIVMS [facility] Hematology/Oncology Physician [facility]  (Office):       [phone] (Work cell):  [phone] (Fax):           [phone]  04/22/2019 1:19 PM  I, Jacqualyn Posey, am acting as a Education administrator for Dr. Sullivan Lone.   .I have reviewed the above documentation for accuracy and completeness, and I agree with the above. Brunetta Genera MD

## 2019-04-22 ENCOUNTER — Other Ambulatory Visit: Payer: Self-pay

## 2019-04-22 ENCOUNTER — Telehealth: Payer: Self-pay | Admitting: *Deleted

## 2019-04-22 ENCOUNTER — Inpatient Hospital Stay (HOSPITAL_BASED_OUTPATIENT_CLINIC_OR_DEPARTMENT_OTHER): Payer: PPO | Admitting: Hematology

## 2019-04-22 ENCOUNTER — Inpatient Hospital Stay: Payer: PPO | Attending: Hematology

## 2019-04-22 ENCOUNTER — Telehealth: Payer: Self-pay | Admitting: Hematology

## 2019-04-22 VITALS — BP 172/75 | HR 73 | Temp 99.2°F | Resp 18 | Ht 72.0 in | Wt 186.0 lb

## 2019-04-22 DIAGNOSIS — E119 Type 2 diabetes mellitus without complications: Secondary | ICD-10-CM | POA: Insufficient documentation

## 2019-04-22 DIAGNOSIS — C9110 Chronic lymphocytic leukemia of B-cell type not having achieved remission: Secondary | ICD-10-CM

## 2019-04-22 DIAGNOSIS — R319 Hematuria, unspecified: Secondary | ICD-10-CM | POA: Insufficient documentation

## 2019-04-22 DIAGNOSIS — Z79899 Other long term (current) drug therapy: Secondary | ICD-10-CM | POA: Diagnosis not present

## 2019-04-22 DIAGNOSIS — R61 Generalized hyperhidrosis: Secondary | ICD-10-CM | POA: Insufficient documentation

## 2019-04-22 DIAGNOSIS — I700 Atherosclerosis of aorta: Secondary | ICD-10-CM | POA: Diagnosis not present

## 2019-04-22 DIAGNOSIS — Z87891 Personal history of nicotine dependence: Secondary | ICD-10-CM | POA: Insufficient documentation

## 2019-04-22 DIAGNOSIS — D696 Thrombocytopenia, unspecified: Secondary | ICD-10-CM

## 2019-04-22 DIAGNOSIS — J439 Emphysema, unspecified: Secondary | ICD-10-CM | POA: Diagnosis not present

## 2019-04-22 DIAGNOSIS — Z6825 Body mass index (BMI) 25.0-25.9, adult: Secondary | ICD-10-CM | POA: Diagnosis not present

## 2019-04-22 LAB — CBC WITH DIFFERENTIAL/PLATELET
Abs Immature Granulocytes: 0.06 10*3/uL (ref 0.00–0.07)
Basophils Absolute: 0.2 10*3/uL — ABNORMAL HIGH (ref 0.0–0.1)
Basophils Relative: 0 %
Eosinophils Absolute: 0.1 10*3/uL (ref 0.0–0.5)
Eosinophils Relative: 0 %
HCT: 41.2 % (ref 39.0–52.0)
Hemoglobin: 14.1 g/dL (ref 13.0–17.0)
Immature Granulocytes: 0 %
Lymphocytes Relative: 91 %
Lymphs Abs: 46.6 10*3/uL — ABNORMAL HIGH (ref 0.7–4.0)
MCH: 33.4 pg (ref 26.0–34.0)
MCHC: 34.2 g/dL (ref 30.0–36.0)
MCV: 97.6 fL (ref 80.0–100.0)
Monocytes Absolute: 1.8 10*3/uL — ABNORMAL HIGH (ref 0.1–1.0)
Monocytes Relative: 3 %
Neutro Abs: 3 10*3/uL (ref 1.7–7.7)
Neutrophils Relative %: 6 %
Platelets: 100 10*3/uL — ABNORMAL LOW (ref 150–400)
RBC: 4.22 MIL/uL (ref 4.22–5.81)
RDW: 14 % (ref 11.5–15.5)
WBC: 51.7 10*3/uL (ref 4.0–10.5)
nRBC: 0.1 % (ref 0.0–0.2)

## 2019-04-22 LAB — CMP (CANCER CENTER ONLY)
ALT: 12 U/L (ref 0–44)
AST: 18 U/L (ref 15–41)
Albumin: 4.1 g/dL (ref 3.5–5.0)
Alkaline Phosphatase: 104 U/L (ref 38–126)
Anion gap: 9 (ref 5–15)
BUN: 11 mg/dL (ref 8–23)
CO2: 27 mmol/L (ref 22–32)
Calcium: 9 mg/dL (ref 8.9–10.3)
Chloride: 96 mmol/L — ABNORMAL LOW (ref 98–111)
Creatinine: 1.07 mg/dL (ref 0.61–1.24)
GFR, Est AFR Am: >60 mL/min (ref >60)
GFR, Estimated: >60 mL/min (ref >60)
Glucose, Bld: 116 mg/dL — ABNORMAL HIGH (ref 70–99)
Potassium: 4.1 mmol/L (ref 3.5–5.1)
Sodium: 132 mmol/L — ABNORMAL LOW (ref 135–145)
Total Bilirubin: 1 mg/dL (ref 0.3–1.2)
Total Protein: 6.3 g/dL — ABNORMAL LOW (ref 6.5–8.1)

## 2019-04-22 LAB — LACTATE DEHYDROGENASE: LDH: 167 U/L (ref 98–192)

## 2019-04-22 NOTE — Telephone Encounter (Signed)
CRITICAL VALUE: WBC 51.7 DATE & TIME NOTIFIED: 04/22/2019 1206 MESSENGER (representative from lab): Rutherford Nail MD NOTIFIED: Dr. Irene Limbo TIME OF NOTIFICATION: 1210 RESPONSE: Acknowledged

## 2019-04-22 NOTE — Telephone Encounter (Signed)
Scheduled appt per 9/3 los.  Spoke with patient and he is aware of his appt date and time.

## 2019-05-05 DIAGNOSIS — Z08 Encounter for follow-up examination after completed treatment for malignant neoplasm: Secondary | ICD-10-CM | POA: Diagnosis not present

## 2019-05-05 DIAGNOSIS — Z1283 Encounter for screening for malignant neoplasm of skin: Secondary | ICD-10-CM | POA: Diagnosis not present

## 2019-05-05 DIAGNOSIS — L720 Epidermal cyst: Secondary | ICD-10-CM | POA: Diagnosis not present

## 2019-05-05 DIAGNOSIS — Z85820 Personal history of malignant melanoma of skin: Secondary | ICD-10-CM | POA: Diagnosis not present

## 2019-05-05 DIAGNOSIS — C44319 Basal cell carcinoma of skin of other parts of face: Secondary | ICD-10-CM | POA: Diagnosis not present

## 2019-05-05 DIAGNOSIS — L986 Other infiltrative disorders of the skin and subcutaneous tissue: Secondary | ICD-10-CM | POA: Diagnosis not present

## 2019-06-07 ENCOUNTER — Ambulatory Visit (INDEPENDENT_AMBULATORY_CARE_PROVIDER_SITE_OTHER): Payer: PPO | Admitting: Cardiology

## 2019-06-07 ENCOUNTER — Encounter: Payer: Self-pay | Admitting: Cardiology

## 2019-06-07 VITALS — BP 178/70 | HR 65 | Ht 71.0 in | Wt 189.9 lb

## 2019-06-07 DIAGNOSIS — I493 Ventricular premature depolarization: Secondary | ICD-10-CM

## 2019-06-07 DIAGNOSIS — L570 Actinic keratosis: Secondary | ICD-10-CM | POA: Diagnosis not present

## 2019-06-07 DIAGNOSIS — I10 Essential (primary) hypertension: Secondary | ICD-10-CM | POA: Diagnosis not present

## 2019-06-07 DIAGNOSIS — I440 Atrioventricular block, first degree: Secondary | ICD-10-CM

## 2019-06-07 DIAGNOSIS — X32XXXD Exposure to sunlight, subsequent encounter: Secondary | ICD-10-CM | POA: Diagnosis not present

## 2019-06-07 DIAGNOSIS — R079 Chest pain, unspecified: Secondary | ICD-10-CM
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
## 2019-06-21 DIAGNOSIS — Z0000 Encounter for general adult medical examination without abnormal findings: Secondary | ICD-10-CM | POA: Diagnosis not present

## 2019-06-21 DIAGNOSIS — E11628 Type 2 diabetes mellitus with other skin complications: Secondary | ICD-10-CM | POA: Diagnosis not present

## 2019-06-21 DIAGNOSIS — E782 Mixed hyperlipidemia: Secondary | ICD-10-CM | POA: Diagnosis not present

## 2019-06-21 DIAGNOSIS — D696 Thrombocytopenia, unspecified: Secondary | ICD-10-CM | POA: Diagnosis not present

## 2019-06-21 DIAGNOSIS — I10 Essential (primary) hypertension: Secondary | ICD-10-CM | POA: Diagnosis not present

## 2019-06-29 DIAGNOSIS — N401 Enlarged prostate with lower urinary tract symptoms: Secondary | ICD-10-CM | POA: Diagnosis not present

## 2019-06-29 DIAGNOSIS — R3912 Poor urinary stream: Secondary | ICD-10-CM | POA: Diagnosis not present

## 2019-06-29 DIAGNOSIS — R3121 Asymptomatic microscopic hematuria: Secondary | ICD-10-CM | POA: Diagnosis not present

## 2019-07-21 ENCOUNTER — Inpatient Hospital Stay: Payer: PPO | Admitting: Hematology

## 2019-07-21 ENCOUNTER — Telehealth: Payer: Self-pay | Admitting: *Deleted

## 2019-07-21 ENCOUNTER — Inpatient Hospital Stay: Payer: PPO | Attending: Hematology

## 2019-07-21 ENCOUNTER — Other Ambulatory Visit: Payer: Self-pay

## 2019-07-21 VITALS — BP 152/61 | HR 40 | Temp 98.5°F | Resp 18 | Ht 71.0 in | Wt 186.8 lb

## 2019-07-21 DIAGNOSIS — Z7984 Long term (current) use of oral hypoglycemic drugs: Secondary | ICD-10-CM | POA: Insufficient documentation

## 2019-07-21 DIAGNOSIS — Z87891 Personal history of nicotine dependence: Secondary | ICD-10-CM | POA: Insufficient documentation

## 2019-07-21 DIAGNOSIS — C9110 Chronic lymphocytic leukemia of B-cell type not having achieved remission: Secondary | ICD-10-CM

## 2019-07-21 DIAGNOSIS — E119 Type 2 diabetes mellitus without complications: Secondary | ICD-10-CM | POA: Diagnosis not present

## 2019-07-21 DIAGNOSIS — R5383 Other fatigue: Secondary | ICD-10-CM | POA: Insufficient documentation

## 2019-07-21 DIAGNOSIS — Z79899 Other long term (current) drug therapy: Secondary | ICD-10-CM | POA: Insufficient documentation

## 2019-07-21 DIAGNOSIS — E785 Hyperlipidemia, unspecified: Secondary | ICD-10-CM | POA: Diagnosis not present

## 2019-07-21 DIAGNOSIS — I10 Essential (primary) hypertension: Secondary | ICD-10-CM | POA: Diagnosis not present

## 2019-07-21 LAB — CBC WITH DIFFERENTIAL/PLATELET
Abs Immature Granulocytes: 0.09 10*3/uL — ABNORMAL HIGH (ref 0.00–0.07)
Basophils Absolute: 0 10*3/uL (ref 0.0–0.1)
Basophils Relative: 0 %
Eosinophils Absolute: 0.1 10*3/uL (ref 0.0–0.5)
Eosinophils Relative: 0 %
HCT: 45 % (ref 39.0–52.0)
Hemoglobin: 15 g/dL (ref 13.0–17.0)
Immature Granulocytes: 0 %
Lymphocytes Relative: 89 %
Lymphs Abs: 58.2 10*3/uL — ABNORMAL HIGH (ref 0.7–4.0)
MCH: 33.2 pg (ref 26.0–34.0)
MCHC: 33.3 g/dL (ref 30.0–36.0)
MCV: 99.6 fL (ref 80.0–100.0)
Monocytes Absolute: 3 10*3/uL — ABNORMAL HIGH (ref 0.1–1.0)
Monocytes Relative: 5 %
Neutro Abs: 3.5 10*3/uL (ref 1.7–7.7)
Neutrophils Relative %: 6 %
Platelets: 83 10*3/uL — ABNORMAL LOW (ref 150–400)
RBC: 4.52 MIL/uL (ref 4.22–5.81)
RDW: 13.7 % (ref 11.5–15.5)
WBC: 64.9 10*3/uL (ref 4.0–10.5)
nRBC: 0 % (ref 0.0–0.2)

## 2019-07-21 LAB — CMP (CANCER CENTER ONLY)
ALT: 14 U/L (ref 0–44)
AST: 19 U/L (ref 15–41)
Albumin: 4.3 g/dL (ref 3.5–5.0)
Alkaline Phosphatase: 97 U/L (ref 38–126)
Anion gap: 13 (ref 5–15)
BUN: 11 mg/dL (ref 8–23)
CO2: 30 mmol/L (ref 22–32)
Calcium: 9.1 mg/dL (ref 8.9–10.3)
Chloride: 92 mmol/L — ABNORMAL LOW (ref 98–111)
Creatinine: 1.11 mg/dL (ref 0.61–1.24)
GFR, Est AFR Am: >60 mL/min (ref >60)
GFR, Estimated: 59 mL/min — ABNORMAL LOW (ref >60)
Glucose, Bld: 153 mg/dL — ABNORMAL HIGH (ref 70–99)
Potassium: 3.3 mmol/L — ABNORMAL LOW (ref 3.5–5.1)
Sodium: 135 mmol/L (ref 135–145)
Total Bilirubin: 1.1 mg/dL (ref 0.3–1.2)
Total Protein: 6.4 g/dL — ABNORMAL LOW (ref 6.5–8.1)

## 2019-07-21 LAB — LACTATE DEHYDROGENASE: LDH: 219 U/L — ABNORMAL HIGH (ref 98–192)

## 2019-07-21 NOTE — Telephone Encounter (Signed)
0942 - Notified by Lab/Pam: WBC 64.9. Dr. Irene Limbo informed.

## 2019-07-21 NOTE — Progress Notes (Signed)
HEMATOLOGY/ONCOLOGY CONSULTATION NOTE  Date of Service: 07/21/2019  Patient Care Team: Corrington, Delsa Grana, MD as PCP - General (Family Medicine)  CHIEF COMPLAINTS/PURPOSE OF CONSULTATION:  Chronic Lymphocytic Leukemia  HISTORY OF PRESENTING ILLNESS:   Thomas Zavala is a wonderful 85 y.o. male who has been referred to [place] by Bradly Bienenstock, NP for evaluation and management of his Chronic Lymphocytic Leukemia. The patient has been seen by Dr. Murriel Hopper in the past. He is accompanied today by his daughter. The pt reports that he is doing well overall.   The pt reports that he had ease of bruising in 2017 at which time he sought out a physical and was observed to have lymphocytosis and was diagnosed with CLL in November 2017. The pt denies having any other symptoms at that time. The pt has not had treatment for his CLL.  The pt notes that he has had some sweating at night between his legs, and feelings of flushness, which he notes started "in the last week or so." The pt denies fevers, chills, and noticing any new lumps or bumps. He has had recurrent squamous cell carcinomas for "several years," in sun-exposed areas. He follows with his dermatologist regularly for skin screenings.  The pt notes that his energy levels have diminished somewhat over the last 3 years, but denies fatigue being a limiting factor. He recently went on a 2 mile hike with his daughter. He has a one level living arrangement. The pt denies frequent or recurrent infections.   The pt notes that he follows with Dr. Gloriann Loan in urology for occasional hematuria. He notes that his DM and blood pressure have been well controlled.   The pt notes that he has had both of his pneumonia vaccines in the last 5 years and received his flu shot this season.  Most recent lab results (10/21/18) of CBC w/diff is as follows: all values are WNL except for WBC at WBC at 51.5k, PLT at 73k, Lymphs abs at 46.5k, Monocytes abs at 1.9k. 10/21/18 CMP  is pending 10/21/18 LDH is pending  On review of systems, pt reports staying active, eating well, mildly lower energy levels, and denies drenching night sweats, fevers, new fatigue, chills, unexpected weight loss, frequent infections, recurrent infections, leg swelling, abdominal pains, and any other symptoms.  On PMHx the pt reports CLL, DM type II.   INTERVAL HISTORY:   Thomas Zavala returns today for management and evaluation of his CLL. The patient's last visit with [place] was on 04/22/2019. The pt reports that he is doing well overall.  The pt reports that he had a skin reaction around the 4th of July. The spots are still on his abdomen and are more pronounced after a shower but do not itch. Pt believes that this could have been caused by 5 Fluorouracil. Pt has been experiencing passing episodes of mild nausea. They usually occur before he eats and are not accompanied by any reflux symptoms. These episodes do not appear to have been caused by 5 Fluorouracil. Pt denies any night sweats but notes that he does have some mild body sweats in the mornings. Pt does have some age-related fatigue and skin thinning on his hands. Pt is not on any blood thinners at this time.   Lab results today (07/21/19) of CBC w/diff and CMP is as follows: all values are WNL except for WBC at 64.9K, PLTs at 83K, Lymphs Abs at 58.2K, Mono Abs at 3.0K, Abs Immature Granulocytes at 0.09K, WBC  Morphology shows "Smudge Cells", Potassium at 3.3, Chloride at 92, Glucose at 153, Total Protein at 6.4, GFR Est Non Af Am at 59. 07/21/2019 LDH at 219  On review of systems, pt reports skin lesions, mild nausea, age-related fatigue, skin thinning on hands and denies acid reflux, fevers, chills, night sweats, new lumps/bumps, fatigue and any other symptoms.   MEDICAL HISTORY:  Past Medical History:  Diagnosis Date  . BPH (benign prostatic hyperplasia) 07/25/2016  . CLL (chronic lymphocytic leukemia) ([place])   . Hypertension   . Type 2  diabetes mellitus without complication, without long-term current use of insulin ([place]) 07/25/2016    SURGICAL HISTORY: Past Surgical History:  Procedure Laterality Date  . PROSTATE SURGERY      SOCIAL HISTORY: Social History   Socioeconomic History  . Marital status: Widowed    Spouse name: Not on file  . Number of children: 3  . Years of education: Not on file  . Highest education level: Not on file  Occupational History  . Not on file  Social Needs  . Financial resource strain: Not on file  . Food insecurity    Worry: Not on file    Inability: Not on file  . Transportation needs    Medical: Not on file    Non-medical: Not on file  Tobacco Use  . Smoking status: Former Smoker    Years: 25.00  . Smokeless tobacco: Never Used  Substance and Sexual Activity  . Alcohol use: Yes    Comment: socially.  . Drug use: No  . Sexual activity: Not on file  Lifestyle  . Physical activity    Days per week: Not on file    Minutes per session: Not on file  . Stress: Not on file  Relationships  . Social Herbalist on phone: Not on file    Gets together: Not on file    Attends religious service: Not on file    Active member of club or organization: Not on file    Attends meetings of clubs or organizations: Not on file    Relationship status: Not on file  . Intimate partner violence    Fear of current or ex partner: Not on file    Emotionally abused: Not on file    Physically abused: Not on file    Forced sexual activity: Not on file  Other Topics Concern  . Not on file  Social History Narrative  . Not on file    FAMILY HISTORY: Family History  Problem Relation Age of Onset  . Hypertension Father     ALLERGIES:  is allergic to fluorouracil.  MEDICATIONS:  Current Outpatient Medications  Medication Sig Dispense Refill  . losartan-hydrochlorothiazide (HYZAAR) 100-25 MG tablet Take by mouth.    . metFORMIN (GLUCOPHAGE-XR) 500 MG 24 hr tablet Take by mouth.     . metoprolol (LOPRESSOR) 50 MG tablet Take by mouth.    . pravastatin (PRAVACHOL) 40 MG tablet Take by mouth.     No current facility-administered medications for this visit.     REVIEW OF SYSTEMS:   A 10+ POINT REVIEW OF SYSTEMS WAS OBTAINED including neurology, dermatology, psychiatry, cardiac, respiratory, lymph, extremities, GI, GU, Musculoskeletal, constitutional, breasts, reproductive, HEENT.  All pertinent positives are noted in the HPI.  All others are negative.   PHYSICAL EXAMINATION: ECOG PERFORMANCE STATUS: 2 - Symptomatic, <50% confined to bed  . Vitals:   07/21/19 1030  BP: (!) 152/61  Pulse: (!) 40  Resp: 18  Temp: 98.5 F (36.9 C)  SpO2: 98%   Filed Weights   07/21/19 1030  Weight: 186 lb 12.8 oz (84.7 kg)   Body mass index is 26.05 kg/m.  Exam was given in a chair   GENERAL:alert, in no acute distress and comfortable SKIN: no acute rashes, no significant lesions EYES: conjunctiva are pink and non-injected, sclera anicteric OROPHARYNX: MMM, no exudates, no oropharyngeal erythema or ulceration NECK: supple, no JVD LYMPH:  no palpable lymphadenopathy in the cervical, axillary or inguinal regions LUNGS: clear to auscultation b/l with normal respiratory effort HEART: regular rate & rhythm ABDOMEN:  normoactive bowel sounds , non tender, not distended. No palpable hepatosplenomegaly.  Extremity: no pedal edema PSYCH: alert & oriented x 3 with fluent speech NEURO: no focal motor/sensory deficits  LABORATORY DATA:  I have reviewed the data as listed  . CBC Latest Ref Rng & Units 07/21/2019 04/22/2019 01/20/2019  WBC 4.0 - 10.5 K/uL 64.9(HH) 51.7(HH) 60.0(HH)  Hemoglobin 13.0 - 17.0 g/dL 15.0 14.1 14.5  Hematocrit 39.0 - 52.0 % 45.0 41.2 42.3  Platelets 150 - 400 K/uL 83(L) 100(L) 73(L)    . CMP Latest Ref Rng & Units 07/21/2019 04/22/2019 01/20/2019  Glucose 70 - 99 mg/dL 153(H) 116(H) 150(H)  BUN 8 - 23 mg/dL 11 11 11   Creatinine 0.61 - 1.24 mg/dL 1.11  1.07 1.17  Sodium 135 - 145 mmol/L 135 132(L) 130(L)  Potassium 3.5 - 5.1 mmol/L 3.3(L) 4.1 3.8  Chloride 98 - 111 mmol/L 92(L) 96(L) 93(L)  CO2 22 - 32 mmol/L 30 27 27   Calcium 8.9 - 10.3 mg/dL 9.1 9.0 8.7(L)  Total Protein 6.5 - 8.1 g/dL 6.4(L) 6.3(L) 6.2(L)  Total Bilirubin 0.3 - 1.2 mg/dL 1.1 1.0 1.2  Alkaline Phos 38 - 126 U/L 97 104 89  AST 15 - 41 U/L 19 18 21   ALT 0 - 44 U/L 14 12 15     07/15/16 Cytogenetics and Flow Cytometry:    RADIOGRAPHIC STUDIES: I have personally reviewed the radiological images as listed and agreed with the findings in the report. No results found.   ASSESSMENT & PLAN:  85 y.o. male with  1. Chronic Lymphocytic Leukemia Diagnosed in November 2017 with flow cytometry 07/15/16 Cytogenetics revealed Trisomy 12  09/03/16 CT A/P revealed Borderline splenomegaly with 1.3 cm low-attenuation lesion in the anterior aspect of spleen. Mild upper abdominal and right iliac lymphadenopathy. 4 mm indeterminate pulmonary nodule in right lung base. Recommend continued attention on follow-up CT. Incidentally noted benign right hepatic lobe hemangioma, mildly and enlarged prostate, and colonic diverticulosis  10/10/17 CT Chest revealed Scattered small bilateral pulmonary nodules, including two dominant right middle lobe nodules measuring 5-6 mm, unchanged, likely benign. If clinically warranted, consider a single follow-up CT chest in 1 year. Mild mediastinal and bilateral hilar lymphadenopathy, likely related to the patient's known CLL. Mild splenomegaly, incompletely visualized. Aortic Atherosclerosis and Emphysema   PLAN: -Discussed pt labwork today, 07/21/19; WBC stable, PLTs declining, mild hypokalemia -Discussed 07/21/2019 LDH mildly elevated at 219 -Advised pt that CLL increases the chance of non-melanoma skin cancer and he should continue f/u with his dermatologist for continued monitoring for this. -No lab or clinical evidence of symptomatic CLL progression  at this time -No indication for initiation of CLL treatment at this time. -Will see in 3 months with labs  FOLLOW UP: RTC with Dr Irene Limbo with labs in 3 months  The total time spent in the appt was 15 minutes and more than  50% was on counseling and direct patient cares.  All of the patient's questions were answered with apparent satisfaction. The patient knows to call the clinic with any problems, questions or concerns.   Sullivan Lone MD [place] AAHIVMS [facility] Hematology/Oncology Physician [facility]  (Office):       [phone] (Work cell):  [phone] (Fax):           [phone]  07/21/2019 11:40 AM  I, Yevette Edwards, am acting as a scribe for Dr. Sullivan Lone.   .I have reviewed the above documentation for accuracy and completeness, and I agree with the above. Brunetta Genera MD

## 2019-07-22 ENCOUNTER — Telehealth: Payer: Self-pay | Admitting: Hematology

## 2019-07-22 NOTE — Telephone Encounter (Signed)
Scheduled appt per 12/2 los. ° °Spoke with pt and he is aware of the appt date and time. °

## 2019-08-18 DIAGNOSIS — X32XXXD Exposure to sunlight, subsequent encounter: Secondary | ICD-10-CM | POA: Diagnosis not present

## 2019-08-18 DIAGNOSIS — C44329 Squamous cell carcinoma of skin of other parts of face: Secondary | ICD-10-CM | POA: Diagnosis not present

## 2019-08-18 DIAGNOSIS — L570 Actinic keratosis: Secondary | ICD-10-CM | POA: Diagnosis not present

## 2019-09-01 DIAGNOSIS — C44319 Basal cell carcinoma of skin of other parts of face: Secondary | ICD-10-CM | POA: Diagnosis not present

## 2019-09-01 DIAGNOSIS — C4441 Basal cell carcinoma of skin of scalp and neck: Secondary | ICD-10-CM | POA: Diagnosis not present

## 2019-09-20 DIAGNOSIS — D0439 Carcinoma in situ of skin of other parts of face: Secondary | ICD-10-CM | POA: Diagnosis not present

## 2019-09-20 DIAGNOSIS — X32XXXD Exposure to sunlight, subsequent encounter: Secondary | ICD-10-CM | POA: Diagnosis not present

## 2019-09-20 DIAGNOSIS — Z85828 Personal history of other malignant neoplasm of skin: Secondary | ICD-10-CM | POA: Diagnosis not present

## 2019-09-20 DIAGNOSIS — Z08 Encounter for follow-up examination after completed treatment for malignant neoplasm: Secondary | ICD-10-CM | POA: Diagnosis not present

## 2019-09-20 DIAGNOSIS — L570 Actinic keratosis: Secondary | ICD-10-CM | POA: Diagnosis not present

## 2019-09-20 DIAGNOSIS — C44329 Squamous cell carcinoma of skin of other parts of face: Secondary | ICD-10-CM | POA: Diagnosis not present

## 2019-10-11 DIAGNOSIS — B078 Other viral warts: Secondary | ICD-10-CM | POA: Diagnosis not present

## 2019-10-11 DIAGNOSIS — X32XXXD Exposure to sunlight, subsequent encounter: Secondary | ICD-10-CM | POA: Diagnosis not present

## 2019-10-11 DIAGNOSIS — C44329 Squamous cell carcinoma of skin of other parts of face: Secondary | ICD-10-CM | POA: Diagnosis not present

## 2019-10-11 DIAGNOSIS — L570 Actinic keratosis: Secondary | ICD-10-CM | POA: Diagnosis not present

## 2019-10-18 NOTE — Progress Notes (Signed)
HEMATOLOGY/ONCOLOGY CLINIC  Date of Service: 10/19/2019  Patient Care Team: Corrington, Delsa Grana, MD as PCP - General (Family Medicine)  CHIEF COMPLAINTS/PURPOSE OF CONSULTATION:  Chronic Lymphocytic Leukemia  HISTORY OF PRESENTING ILLNESS:   Thomas Zavala is a wonderful 86 y.o. male who has been referred to [place] by Bradly Bienenstock, NP for evaluation and management of his Chronic Lymphocytic Leukemia. The patient has been seen by Dr. Murriel Hopper in the past. He is accompanied today by his daughter. The pt reports that he is doing well overall.   The pt reports that he had ease of bruising in 2017 at which time he sought out a physical and was observed to have lymphocytosis and was diagnosed with CLL in November 2017. The pt denies having any other symptoms at that time. The pt has not had treatment for his CLL.  The pt notes that he has had some sweating at night between his legs, and feelings of flushness, which he notes started "in the last week or so." The pt denies fevers, chills, and noticing any new lumps or bumps. He has had recurrent squamous cell carcinomas for "several years," in sun-exposed areas. He follows with his dermatologist regularly for skin screenings.  The pt notes that his energy levels have diminished somewhat over the last 3 years, but denies fatigue being a limiting factor. He recently went on a 2 mile hike with his daughter. He has a one level living arrangement. The pt denies frequent or recurrent infections.   The pt notes that he follows with Dr. Gloriann Loan in urology for occasional hematuria. He notes that his DM and blood pressure have been well controlled.   The pt notes that he has had both of his pneumonia vaccines in the last 5 years and received his flu shot this season.  Most recent lab results (10/21/18) of CBC w/diff is as follows: all values are WNL except for WBC at WBC at 51.5k, PLT at 73k, Lymphs abs at 46.5k, Monocytes abs at 1.9k. 10/21/18 CMP is  pending 10/21/18 LDH is pending  On review of systems, pt reports staying active, eating well, mildly lower energy levels, and denies drenching night sweats, fevers, new fatigue, chills, unexpected weight loss, frequent infections, recurrent infections, leg swelling, abdominal pains, and any other symptoms.  On PMHx the pt reports CLL, DM type II.   INTERVAL HISTORY:   Thomas Zavala returns today for management and evaluation of his CLL. The patient's last visit with [place] was on 07/21/2019. The pt reports that he is doing well overall.  The pt reports that he has been feeling well and has had no new concerns in the interim. Pt has had both doses of the COVID19 vaccine and denies any residual symptoms or concerns. He does have some fatigue that he attributes to old age. This has not changed much in the last 6 months. Pt has noticed some increased tenderness towards the back of his left flank area.   Lab results today (10/19/19) of CBC w/diff and CMP is as follows: all values are WNL except for WBC at 76.1, MCH at 34.3, PLT at 80K, Lymphs Abs at 69.0, Mono Abs at 3.2K, Abs Immature Granulocytes at 0.11K, WBC Morphology shows "Atypical Lymphocytes Present", Sodium at 134, Chloride at 94, Glucose at 152, Calcium at 8.8, GFR Est Non Af Am at 59. 10/19/2019 LDH at 168   On review of systems, pt reports bruising, fatigue, leg weakness and denies fevers, chills, night sweats, gum  bleeds, nose bleeds, bloody/black stools, abdominal pain, new lumps/bumps and any other symptoms.   MEDICAL HISTORY:  Past Medical History:  Diagnosis Date  . BPH (benign prostatic hyperplasia) 07/25/2016  . CLL (chronic lymphocytic leukemia) ([place])   . Hypertension   . Type 2 diabetes mellitus without complication, without long-term current use of insulin ([place]) 07/25/2016    SURGICAL HISTORY: Past Surgical History:  Procedure Laterality Date  . PROSTATE SURGERY      SOCIAL HISTORY: Social History   Socioeconomic  History  . Marital status: Widowed    Spouse name: Not on file  . Number of children: 3  . Years of education: Not on file  . Highest education level: Not on file  Occupational History  . Not on file  Tobacco Use  . Smoking status: Former Smoker    Years: 25.00  . Smokeless tobacco: Never Used  Substance and Sexual Activity  . Alcohol use: Yes    Comment: socially.  . Drug use: No  . Sexual activity: Not on file  Other Topics Concern  . Not on file  Social History Narrative  . Not on file   Social Determinants of Health   Financial Resource Strain:   . Difficulty of Paying Living Expenses: Not on file  Food Insecurity:   . Worried About Charity fundraiser in the Last Year: Not on file  . Ran Out of Food in the Last Year: Not on file  Transportation Needs:   . Lack of Transportation (Medical): Not on file  . Lack of Transportation (Non-Medical): Not on file  Physical Activity:   . Days of Exercise per Week: Not on file  . Minutes of Exercise per Session: Not on file  Stress:   . Feeling of Stress : Not on file  Social Connections:   . Frequency of Communication with Friends and Family: Not on file  . Frequency of Social Gatherings with Friends and Family: Not on file  . Attends Religious Services: Not on file  . Active Member of Clubs or Organizations: Not on file  . Attends Archivist Meetings: Not on file  . Marital Status: Not on file  Intimate Partner Violence:   . Fear of Current or Ex-Partner: Not on file  . Emotionally Abused: Not on file  . Physically Abused: Not on file  . Sexually Abused: Not on file    FAMILY HISTORY: Family History  Problem Relation Age of Onset  . Hypertension Father     ALLERGIES:  is allergic to fluorouracil.  MEDICATIONS:  Current Outpatient Medications  Medication Sig Dispense Refill  . losartan-hydrochlorothiazide (HYZAAR) 100-25 MG tablet Take by mouth.    . metFORMIN (GLUCOPHAGE-XR) 500 MG 24 hr tablet Take  by mouth.    . metoprolol (LOPRESSOR) 50 MG tablet Take by mouth.    . pravastatin (PRAVACHOL) 40 MG tablet Take by mouth.     No current facility-administered medications for this visit.    REVIEW OF SYSTEMS:   A 10+ POINT REVIEW OF SYSTEMS WAS OBTAINED including neurology, dermatology, psychiatry, cardiac, respiratory, lymph, extremities, GI, GU, Musculoskeletal, constitutional, breasts, reproductive, HEENT.  All pertinent positives are noted in the HPI.  All others are negative.   PHYSICAL EXAMINATION: ECOG PERFORMANCE STATUS: 2 - Symptomatic, <50% confined to bed  . Vitals:   10/19/19 1122  BP: (!) 143/92  Pulse: 70  Resp: 18  Temp: 98.5 F (36.9 C)  SpO2: 98%   Filed Weights   10/19/19  1122  Weight: 187 lb 14.4 oz (85.2 kg)   Body mass index is 26.21 kg/m.  GENERAL:alert, in no acute distress and comfortable SKIN: no acute rashes, no significant lesions EYES: conjunctiva are pink and non-injected, sclera anicteric OROPHARYNX: MMM, no exudates, no oropharyngeal erythema or ulceration NECK: supple, no JVD LYMPH:  no palpable lymphadenopathy in the cervical, axillary or inguinal regions LUNGS: clear to auscultation b/l with normal respiratory effort HEART: regular rate & rhythm ABDOMEN:  normoactive bowel sounds , non tender, not distended. No palpable hepatomegaly. Spleen is just palpable. Extremity: trace pedal edema b/l PSYCH: alert & oriented x 3 with fluent speech NEURO: no focal motor/sensory deficits  LABORATORY DATA:  I have reviewed the data as listed  . CBC Latest Ref Rng & Units 10/19/2019 07/21/2019 04/22/2019  WBC 4.0 - 10.5 K/uL 76.1(HH) 64.9(HH) 51.7(HH)  Hemoglobin 13.0 - 17.0 g/dL 15.7 15.0 14.1  Hematocrit 39.0 - 52.0 % 45.8 45.0 41.2  Platelets 150 - 400 K/uL 80(L) 83(L) 100(L)    . CMP Latest Ref Rng & Units 10/19/2019 07/21/2019 04/22/2019  Glucose 70 - 99 mg/dL 152(H) 153(H) 116(H)  BUN 8 - 23 mg/dL 10 11 11   Creatinine 0.61 - 1.24 mg/dL 1.11  1.11 1.07  Sodium 135 - 145 mmol/L 134(L) 135 132(L)  Potassium 3.5 - 5.1 mmol/L 3.6 3.3(L) 4.1  Chloride 98 - 111 mmol/L 94(L) 92(L) 96(L)  CO2 22 - 32 mmol/L 32 30 27  Calcium 8.9 - 10.3 mg/dL 8.8(L) 9.1 9.0  Total Protein 6.5 - 8.1 g/dL 6.5 6.4(L) 6.3(L)  Total Bilirubin 0.3 - 1.2 mg/dL 1.2 1.1 1.0  Alkaline Phos 38 - 126 U/L 99 97 104  AST 15 - 41 U/L 22 19 18   ALT 0 - 44 U/L 14 14 12     07/15/16 Cytogenetics and Flow Cytometry:    RADIOGRAPHIC STUDIES: I have personally reviewed the radiological images as listed and agreed with the findings in the report. No results found.   ASSESSMENT & PLAN:  86 y.o. male with  1. Chronic Lymphocytic Leukemia Diagnosed in November 2017 with flow cytometry 07/15/16 Cytogenetics revealed Trisomy 12  09/03/16 CT A/P revealed Borderline splenomegaly with 1.3 cm low-attenuation lesion in the anterior aspect of spleen. Mild upper abdominal and right iliac lymphadenopathy. 4 mm indeterminate pulmonary nodule in right lung base. Recommend continued attention on follow-up CT. Incidentally noted benign right hepatic lobe hemangioma, mildly and enlarged prostate, and colonic diverticulosis  10/10/17 CT Chest revealed Scattered small bilateral pulmonary nodules, including two dominant right middle lobe nodules measuring 5-6 mm, unchanged, likely benign. If clinically warranted, consider a single follow-up CT chest in 1 year. Mild mediastinal and bilateral hilar lymphadenopathy, likely related to the patient's known CLL. Mild splenomegaly, incompletely visualized. Aortic Atherosclerosis and Emphysema   PLAN: -Discussed pt labwork today, 10/19/19; WBC is slowly climbing, Hgb stable, PLT slowly declining, Potassium is borderline low, other blood chemistries are steady  -Discussed 10/19/2019 LDH is WNL -Advised pt that prophylactic antibiotics would be recommended for any dental procedures to reduce the risk of infections -PLT are okay for dental  procedures -No lab or clinical evidence of symptomatic CLL progression at this time -Not unreasonable to continue watching with labs and clinic visits for now -Advised pt that we would begin treating if he has constitutional symptoms or significant cytopenias - will continue to watch PLT -Advised pt of the symptoms to be aware of such as: fevers, chills, drenching night sweats, and debilitating fatigue -Will see  back in 4 months with labs, sooner if any new symptomology   FOLLOW UP: RTC with Dr Irene Limbo with labs in 4 months  The total time spent in the appt was 20 minutes and more than 50% was on counseling and direct patient cares.  All of the patient's questions were answered with apparent satisfaction. The patient knows to call the clinic with any problems, questions or concerns.    Sullivan Lone MD [place] AAHIVMS [facility] Hematology/Oncology Physician [facility]  (Office):       [phone] (Work cell):  [phone] (Fax):           [phone]  10/19/2019 12:05 PM  I, Yevette Edwards, am acting as a scribe for Dr. Sullivan Lone.   .I have reviewed the above documentation for accuracy and completeness, and I agree with the above. Brunetta Genera MD

## 2019-10-19 ENCOUNTER — Inpatient Hospital Stay: Payer: PPO | Attending: Hematology

## 2019-10-19 ENCOUNTER — Other Ambulatory Visit: Payer: Self-pay

## 2019-10-19 ENCOUNTER — Inpatient Hospital Stay: Payer: PPO | Admitting: Hematology

## 2019-10-19 VITALS — BP 143/92 | HR 70 | Temp 98.5°F | Resp 18 | Ht 71.0 in | Wt 187.9 lb

## 2019-10-19 DIAGNOSIS — C9110 Chronic lymphocytic leukemia of B-cell type not having achieved remission: Secondary | ICD-10-CM

## 2019-10-19 DIAGNOSIS — Z85828 Personal history of other malignant neoplasm of skin: Secondary | ICD-10-CM | POA: Insufficient documentation

## 2019-10-19 DIAGNOSIS — I10 Essential (primary) hypertension: Secondary | ICD-10-CM | POA: Insufficient documentation

## 2019-10-19 DIAGNOSIS — Z7984 Long term (current) use of oral hypoglycemic drugs: Secondary | ICD-10-CM | POA: Insufficient documentation

## 2019-10-19 DIAGNOSIS — D696 Thrombocytopenia, unspecified: Secondary | ICD-10-CM

## 2019-10-19 DIAGNOSIS — Z87891 Personal history of nicotine dependence: Secondary | ICD-10-CM | POA: Insufficient documentation

## 2019-10-19 DIAGNOSIS — E119 Type 2 diabetes mellitus without complications: Secondary | ICD-10-CM | POA: Insufficient documentation

## 2019-10-19 LAB — CBC WITH DIFFERENTIAL/PLATELET
Abs Immature Granulocytes: 0.11 10*3/uL — ABNORMAL HIGH (ref 0.00–0.07)
Basophils Absolute: 0.1 10*3/uL (ref 0.0–0.1)
Basophils Relative: 0 %
Eosinophils Absolute: 0.1 10*3/uL (ref 0.0–0.5)
Eosinophils Relative: 0 %
HCT: 45.8 % (ref 39.0–52.0)
Hemoglobin: 15.7 g/dL (ref 13.0–17.0)
Immature Granulocytes: 0 %
Lymphocytes Relative: 91 %
Lymphs Abs: 69 10*3/uL — ABNORMAL HIGH (ref 0.7–4.0)
MCH: 34.3 pg — ABNORMAL HIGH (ref 26.0–34.0)
MCHC: 34.3 g/dL (ref 30.0–36.0)
MCV: 100 fL (ref 80.0–100.0)
Monocytes Absolute: 3.2 10*3/uL — ABNORMAL HIGH (ref 0.1–1.0)
Monocytes Relative: 4 %
Neutro Abs: 3.5 10*3/uL (ref 1.7–7.7)
Neutrophils Relative %: 5 %
Platelets: 80 10*3/uL — ABNORMAL LOW (ref 150–400)
RBC: 4.58 MIL/uL (ref 4.22–5.81)
RDW: 13.5 % (ref 11.5–15.5)
WBC: 76.1 10*3/uL (ref 4.0–10.5)
nRBC: 0.1 % (ref 0.0–0.2)

## 2019-10-19 LAB — CMP (CANCER CENTER ONLY)
ALT: 14 U/L (ref 0–44)
AST: 22 U/L (ref 15–41)
Albumin: 4.1 g/dL (ref 3.5–5.0)
Alkaline Phosphatase: 99 U/L (ref 38–126)
Anion gap: 8 (ref 5–15)
BUN: 10 mg/dL (ref 8–23)
CO2: 32 mmol/L (ref 22–32)
Calcium: 8.8 mg/dL — ABNORMAL LOW (ref 8.9–10.3)
Chloride: 94 mmol/L — ABNORMAL LOW (ref 98–111)
Creatinine: 1.11 mg/dL (ref 0.61–1.24)
GFR, Est AFR Am: >60 mL/min (ref >60)
GFR, Estimated: 59 mL/min — ABNORMAL LOW (ref >60)
Glucose, Bld: 152 mg/dL — ABNORMAL HIGH (ref 70–99)
Potassium: 3.6 mmol/L (ref 3.5–5.1)
Sodium: 134 mmol/L — ABNORMAL LOW (ref 135–145)
Total Bilirubin: 1.2 mg/dL (ref 0.3–1.2)
Total Protein: 6.5 g/dL (ref 6.5–8.1)

## 2019-10-19 LAB — LACTATE DEHYDROGENASE: LDH: 168 U/L (ref 98–192)

## 2019-10-22 ENCOUNTER — Telehealth: Payer: Self-pay | Admitting: Hematology

## 2019-10-22 NOTE — Telephone Encounter (Signed)
Scheduled per 03/02 los, patient has been called and notified. ?

## 2019-11-02 DIAGNOSIS — C44329 Squamous cell carcinoma of skin of other parts of face: Secondary | ICD-10-CM | POA: Diagnosis not present

## 2019-11-08 DIAGNOSIS — C44329 Squamous cell carcinoma of skin of other parts of face: Secondary | ICD-10-CM | POA: Diagnosis not present

## 2019-11-08 DIAGNOSIS — R238 Other skin changes: Secondary | ICD-10-CM | POA: Diagnosis not present

## 2019-11-08 DIAGNOSIS — D485 Neoplasm of uncertain behavior of skin: Secondary | ICD-10-CM | POA: Diagnosis not present

## 2019-11-08 DIAGNOSIS — C44319 Basal cell carcinoma of skin of other parts of face: Secondary | ICD-10-CM | POA: Diagnosis not present

## 2019-11-12 DIAGNOSIS — C4339 Malignant melanoma of other parts of face: Secondary | ICD-10-CM | POA: Diagnosis not present

## 2019-11-12 DIAGNOSIS — X32XXXD Exposure to sunlight, subsequent encounter: Secondary | ICD-10-CM | POA: Diagnosis not present

## 2019-11-12 DIAGNOSIS — Z08 Encounter for follow-up examination after completed treatment for malignant neoplasm: Secondary | ICD-10-CM | POA: Diagnosis not present

## 2019-11-12 DIAGNOSIS — L570 Actinic keratosis: Secondary | ICD-10-CM | POA: Diagnosis not present

## 2019-11-12 DIAGNOSIS — Z1283 Encounter for screening for malignant neoplasm of skin: Secondary | ICD-10-CM | POA: Diagnosis not present

## 2019-11-12 DIAGNOSIS — Z85820 Personal history of malignant melanoma of skin: Secondary | ICD-10-CM | POA: Diagnosis not present

## 2019-11-12 DIAGNOSIS — D225 Melanocytic nevi of trunk: Secondary | ICD-10-CM | POA: Diagnosis not present

## 2019-11-12 DIAGNOSIS — C44319 Basal cell carcinoma of skin of other parts of face: Secondary | ICD-10-CM | POA: Diagnosis not present

## 2019-11-23 ENCOUNTER — Telehealth: Payer: Self-pay | Admitting: *Deleted

## 2019-11-23 NOTE — Telephone Encounter (Signed)
Late entry for 4/2 - Contacted Thomas Zavala, [place], w/Dr. [facility] office [phone])  in response to this message left late 4/1: Patient now diagnosed with Melanoma. She wanted to know if Dr. Irene Limbo would provide care for this as patient already sees Dr. Irene Limbo. Attempted to contact Thomas Zavala on 4/2 - office closed 4/2 and 4/5 for holiday weekend.  Contacted MD office on 4/6, spoke with Dani. Thomas Zavala gone for day. Left message with staff asking her to return call. Requested most recent office visit note and biopsy result from staff. Received faxed notes - gave to Dr. Irene Limbo.  Patient called office to ask about next steps. Informed him that once records reviewed by Dr. Irene Limbo, office will contact him. He verbalized understanding - stating he will be out of town until 4/15.

## 2019-11-24 ENCOUNTER — Telehealth: Payer: Self-pay | Admitting: *Deleted

## 2019-11-24 NOTE — Telephone Encounter (Signed)
Per Dr. Grier Mitts instruction: Contacted patient and inform that Dr. Irene Limbo wants to have an appointment with him when he returns to [facility] to discuss recent test results and treatment plan. Contacted patient - left voice mail with information as above and that scheduler will contact him to make appt

## 2019-11-25 ENCOUNTER — Telehealth: Payer: Self-pay | Admitting: *Deleted

## 2019-11-25 NOTE — Telephone Encounter (Signed)
Contacted Ms [employer], [place], w/Dr. [facility] office [phone]) to notify that Dr. Irene Limbo informed of patient's diagnosis. He will schedule appt to see patient following his return to [facility] (after 4/15). Message taken by office staff.

## 2019-12-01 ENCOUNTER — Telehealth: Payer: Self-pay | Admitting: *Deleted

## 2019-12-01 NOTE — Telephone Encounter (Signed)
Opened in error

## 2019-12-02 ENCOUNTER — Telehealth: Payer: Self-pay | Admitting: Hematology

## 2019-12-02 NOTE — Telephone Encounter (Signed)
Scheduled appt per per 4/7 sch message - pt is aware of appt date and time

## 2019-12-05 NOTE — Progress Notes (Signed)
HEMATOLOGY/ONCOLOGY CLINIC  Date of Service: 12/06/2019  Patient Care Team: Corrington, Delsa Grana, MD as PCP - General (Family Medicine)  CHIEF COMPLAINTS/PURPOSE OF CONSULTATION:  Chronic Lymphocytic Leukemia  HISTORY OF PRESENTING ILLNESS:   Thomas Zavala is a wonderful 86 y.o. male who has been referred to [place] by Bradly Bienenstock, NP for evaluation and management of his Chronic Lymphocytic Leukemia. The patient has been seen by Dr. Murriel Hopper in the past. He is accompanied today by his daughter. The pt reports that he is doing well overall.   The pt reports that he had ease of bruising in 2017 at which time he sought out a physical and was observed to have lymphocytosis and was diagnosed with CLL in November 2017. The pt denies having any other symptoms at that time. The pt has not had treatment for his CLL.  The pt notes that he has had some sweating at night between his legs, and feelings of flushness, which he notes started "in the last week or so." The pt denies fevers, chills, and noticing any new lumps or bumps. He has had recurrent squamous cell carcinomas for "several years," in sun-exposed areas. He follows with his dermatologist regularly for skin screenings.  The pt notes that his energy levels have diminished somewhat over the last 3 years, but denies fatigue being a limiting factor. He recently went on a 2 mile hike with his daughter. He has a one level living arrangement. The pt denies frequent or recurrent infections.   The pt notes that he follows with Dr. Gloriann Loan in urology for occasional hematuria. He notes that his DM and blood pressure have been well controlled.   The pt notes that he has had both of his pneumonia vaccines in the last 5 years and received his flu shot this season.  Most recent lab results (10/21/18) of CBC w/diff is as follows: all values are WNL except for WBC at WBC at 51.5k, PLT at 73k, Lymphs abs at 46.5k, Monocytes abs at 1.9k. 10/21/18 CMP is  pending 10/21/18 LDH is pending  On review of systems, pt reports staying active, eating well, mildly lower energy levels, and denies drenching night sweats, fevers, new fatigue, chills, unexpected weight loss, frequent infections, recurrent infections, leg swelling, abdominal pains, and any other symptoms.  On PMHx the pt reports CLL, DM type II.   INTERVAL HISTORY:   Thomas Zavala returns today for management and evaluation of his CLL. The patient's last visit with [place] was on 10/19/2019. The pt reports that he is doing well overall.  The pt reports that he had a Squamous Cell Cancer removed surgically on his right jawline by [facility] in February. The surgeon that did that procedure recommended pt also get his forehead lesion checked. Pt had his forehead lesion excised about two weeks ago. He had this lesion for about 4-6 months prior to removal and it had not changed much in that time. It was biopsied and found to be Melanoma. Pt had Melanoma years ago, when he first moved to the area that was removed.  Of note since the patient's last visit, pt has had Dermatopathology [phone]) completed on 11/12/2019 with results revealing "1. Skin, left medial forehead, shave: Malignant Melanoma. Melanoma Table Princess Perna 8th Edition) Procedure: Shave Specimen Anatomic Site: Left Medial Forehead, Histologic Type: Superficial Spreading, Breslow's Depth/Maximum Tumor Thickness: 2.7 mm, Clark/Anatiomic Level: III, Peripheral Margins Uninvolved, Deep Margin: Uninvolved, Ulceration: Absent, Satellitosis: Absent, Mitotic Index: 7 per millimeter squared, Lympho-Vascular  Invasion: Absent, Neurotropism: Absent, Tumor-Infiltrating Lymphocytes: Present, Non-Brisk, Tumor Regression: Absent, Lymph Nodes: N/A, Pathologic Stage: PT3A, Comment: A complete excision with adequate margins is recommended. 2. Skin, right temple, shave: Basal Cell Carcinoma, Nodular Pattern, Deep Margin Involved."  Pt has had  Dermatopathology [id number]) completed on 10/11/2019 with results revealing "R Jawline (Shave Removal, Margins Requested): Squamous Cell Carcinoma, Well-to Moderately-Differentiated: Base Involved".  Pt has also had Dermatopathology [phone]) completed on 09/20/2019 with results revealing "R Jawline (Lower) (Shave Removal, Margins Requested): Squamous Cell Carcinoma, Well-to Moderately-Differentiated; Peripheral and Deep Edge Involved".  On review of systems, pt denies headaches, new lumps/bumps, new skin lesions and any other symptoms.    MEDICAL HISTORY:  Past Medical History:  Diagnosis Date  . BPH (benign prostatic hyperplasia) 07/25/2016  . CLL (chronic lymphocytic leukemia) ([place])   . Hypertension   . Type 2 diabetes mellitus without complication, without long-term current use of insulin ([place]) 07/25/2016    SURGICAL HISTORY: Past Surgical History:  Procedure Laterality Date  . PROSTATE SURGERY      SOCIAL HISTORY: Social History   Socioeconomic History  . Marital status: Widowed    Spouse name: Not on file  . Number of children: 3  . Years of education: Not on file  . Highest education level: Not on file  Occupational History  . Not on file  Tobacco Use  . Smoking status: Former Smoker    Years: 25.00  . Smokeless tobacco: Never Used  Substance and Sexual Activity  . Alcohol use: Yes    Comment: socially.  . Drug use: No  . Sexual activity: Not on file  Other Topics Concern  . Not on file  Social History Narrative  . Not on file   Social Determinants of Health   Financial Resource Strain:   . Difficulty of Paying Living Expenses:   Food Insecurity:   . Worried About Charity fundraiser in the Last Year:   . Arboriculturist in the Last Year:   Transportation Needs:   . Film/video editor (Medical):   [name] Lack of Transportation (Non-Medical):   Physical Activity:   . Days of Exercise per Week:   . Minutes of Exercise per Session:   Stress:   .  Feeling of Stress :   Social Connections:   . Frequency of Communication with Friends and Family:   . Frequency of Social Gatherings with Friends and Family:   . Attends Religious Services:   . Active Member of Clubs or Organizations:   . Attends Archivist Meetings:   [name] Marital Status:   Intimate Partner Violence:   . Fear of Current or Ex-Partner:   . Emotionally Abused:   [name] Physically Abused:   . Sexually Abused:     FAMILY HISTORY: Family History  Problem Relation Age of Onset  . Hypertension Father     ALLERGIES:  is allergic to fluorouracil.  MEDICATIONS:  Current Outpatient Medications  Medication Sig Dispense Refill  . losartan-hydrochlorothiazide (HYZAAR) 100-25 MG tablet Take by mouth.    . metFORMIN (GLUCOPHAGE-XR) 500 MG 24 hr tablet Take by mouth.    . metoprolol (LOPRESSOR) 50 MG tablet Take by mouth.    . pravastatin (PRAVACHOL) 40 MG tablet Take by mouth.     No current facility-administered medications for this visit.    REVIEW OF SYSTEMS:   A 10+ POINT REVIEW OF SYSTEMS WAS OBTAINED including neurology, dermatology, psychiatry, cardiac, respiratory, lymph, extremities, GI, GU, Musculoskeletal, constitutional, breasts,  reproductive, HEENT.  All pertinent positives are noted in the HPI.  All others are negative.   PHYSICAL EXAMINATION: ECOG PERFORMANCE STATUS: 2 - Symptomatic, <50% confined to bed  . Vitals:   12/06/19 1024  BP: (!) 156/84  Pulse: 82  Resp: 18  Temp: 98.3 F (36.8 C)  SpO2: 96%   Filed Weights   12/06/19 1024  Weight: 187 lb 6.4 oz (85 kg)   Body mass index is 26.14 kg/m.  GENERAL:alert, in no acute distress and comfortable SKIN: no acute rashes, no significant lesions EYES: conjunctiva are pink and non-injected, sclera anicteric OROPHARYNX: MMM, no exudates, no oropharyngeal erythema or ulceration NECK: supple, no JVD LYMPH:  no palpable lymphadenopathy in the cervical, axillary or inguinal regions LUNGS: clear  to auscultation b/l with normal respiratory effort HEART: regular rate & rhythm ABDOMEN:  normoactive bowel sounds , non tender, not distended. No palpable hepatosplenomegaly. Extremity: no pedal edema PSYCH: alert & oriented x 3 with fluent speech NEURO: no focal motor/sensory deficits  LABORATORY DATA:  I have reviewed the data as listed  . CBC Latest Ref Rng & Units 10/19/2019 07/21/2019 04/22/2019  WBC 4.0 - 10.5 K/uL 76.1(HH) 64.9(HH) 51.7(HH)  Hemoglobin 13.0 - 17.0 g/dL 15.7 15.0 14.1  Hematocrit 39.0 - 52.0 % 45.8 45.0 41.2  Platelets 150 - 400 K/uL 80(L) 83(L) 100(L)    . CMP Latest Ref Rng & Units 10/19/2019 07/21/2019 04/22/2019  Glucose 70 - 99 mg/dL 152(H) 153(H) 116(H)  BUN 8 - 23 mg/dL 10 11 11   Creatinine 0.61 - 1.24 mg/dL 1.11 1.11 1.07  Sodium 135 - 145 mmol/L 134(L) 135 132(L)  Potassium 3.5 - 5.1 mmol/L 3.6 3.3(L) 4.1  Chloride 98 - 111 mmol/L 94(L) 92(L) 96(L)  CO2 22 - 32 mmol/L 32 30 27  Calcium 8.9 - 10.3 mg/dL 8.8(L) 9.1 9.0  Total Protein 6.5 - 8.1 g/dL 6.5 6.4(L) 6.3(L)  Total Bilirubin 0.3 - 1.2 mg/dL 1.2 1.1 1.0  Alkaline Phos 38 - 126 U/L 99 97 104  AST 15 - 41 U/L 22 19 18   ALT 0 - 44 U/L 14 14 12     07/15/16 Cytogenetics and Flow Cytometry:    RADIOGRAPHIC STUDIES: I have personally reviewed the radiological images as listed and agreed with the findings in the report. No results found.   ASSESSMENT & PLAN:  86 y.o. male with  1. Chronic Lymphocytic Leukemia Diagnosed in November 2017 with flow cytometry 07/15/16 Cytogenetics revealed Trisomy 12  09/03/16 CT A/P revealed Borderline splenomegaly with 1.3 cm low-attenuation lesion in the anterior aspect of spleen. Mild upper abdominal and right iliac lymphadenopathy. 4 mm indeterminate pulmonary nodule in right lung base. Recommend continued attention on follow-up CT. Incidentally noted benign right hepatic lobe hemangioma, mildly and enlarged prostate, and colonic diverticulosis  10/10/17 CT  Chest revealed Scattered small bilateral pulmonary nodules, including two dominant right middle lobe nodules measuring 5-6 mm, unchanged, likely benign. If clinically warranted, consider a single follow-up CT chest in 1 year. Mild mediastinal and bilateral hilar lymphadenopathy, likely related to the patient's known CLL. Mild splenomegaly, incompletely visualized. Aortic Atherosclerosis and Emphysema  2. Newly diagnosed forehead cutaneous melanoma (pT3a, NxMx) 2.77mm PLAN: -Discussed 11/12/2019 Dermatopathology [phone]) which revealed "1. Skin, left medial forehead, shave: Malignant Melanoma. Melanoma Table Princess Perna 8th Edition) Procedure: Shave Specimen Anatomic Site: Left Medial Forehead, Histologic Type: Superficial Spreading, Breslow's Depth/Maximum Tumor Thickness: 2.7 mm, Clark/Anatiomic Level: III, Peripheral Margins Uninvolved, Deep Margin: Uninvolved, Ulceration: Absent, Satellitosis: Absent, Mitotic Index: 7  per millimeter squared, Lympho-Vascular Invasion: Absent, Neurotropism: Absent, Tumor-Infiltrating Lymphocytes: Present, Non-Brisk, Tumor Regression: Absent, Lymph Nodes: N/A, Pathologic Stage: PT3A, Comment: A complete excision with adequate margins is recommended. 2. Skin, right temple, shave: Basal Cell Carcinoma, Nodular Pattern, Deep Margin Involved." -Discussed 10/11/2019 Dermatopathology [id number]) which revealed "R Jawline (Shave Removal, Margins Requested): Squamous Cell Carcinoma, Well-to Moderately-Differentiated: Base Involved". -Discussed 09/20/2019 Dermatopathology [id number]) which revealed "R Jawline (Lower) (Shave Removal, Margins Requested): Squamous Cell Carcinoma, Well-to Moderately-Differentiated; Peripheral and Deep Edge Involved". -Advised pt that due to 2.7 mm depth Melanoma will require surgical resection and consideration for a sentinel lymph node biopsy  -will refer patient to plastic surgeyr -- Dr Audelia Hives for surgical consideration for his  melanoma -Advised pt that a positive sentinel lymph node biopsy would indicate the need for a whole body PET/CT scan and discussion for possible adjuvant immunnotherapy -Advised pt that due to CLL, lymphadenopathy on exam or scans can not directly be attributed to melanoma -Advised pt of symptoms that would require imaging including: headaches, new skin lesions, new lumps/bumps -Advised pt again that his CLL will increase his risk of non-Melanoma skin cancers -No lab or clinical evidence of symptomatic CLL recurrence/progression at this time -Not unreasonable to continue watching CLL with labs and clinic visits for now -Recommend pt continue to f/u with Dermatologist for routine screening -Will refer to Dermatologist for Melanoma Removal Surgery   FOLLOW UP: Refer to plastic surgery Dr Audelia Hives for forehead melanoma RTC Dr Irene Limbo in 4 weeks after surgery  The total time spent in the appt was 40 minutes and more than 50% was on counseling and direct patient cares.  All of the patient's questions were answered with apparent satisfaction. The patient knows to call the clinic with any problems, questions or concerns.    Sullivan Lone MD [place] AAHIVMS [facility] Hematology/Oncology Physician [facility]  (Office):       [phone] (Work cell):  [phone] (Fax):           [phone]  12/06/2019 2:57 PM  I, Yevette Edwards, am acting as a scribe for Dr. Sullivan Lone.   .I have reviewed the above documentation for accuracy and completeness, and I agree with the above. Brunetta Genera MD

## 2019-12-06 ENCOUNTER — Other Ambulatory Visit: Payer: Self-pay

## 2019-12-06 ENCOUNTER — Inpatient Hospital Stay: Payer: PPO | Attending: Hematology | Admitting: Hematology

## 2019-12-06 VITALS — BP 156/84 | HR 82 | Temp 98.3°F | Resp 18 | Ht 71.0 in | Wt 187.4 lb

## 2019-12-06 DIAGNOSIS — I10 Essential (primary) hypertension: Secondary | ICD-10-CM | POA: Diagnosis not present

## 2019-12-06 DIAGNOSIS — I700 Atherosclerosis of aorta: Secondary | ICD-10-CM | POA: Diagnosis not present

## 2019-12-06 DIAGNOSIS — N400 Enlarged prostate without lower urinary tract symptoms: Secondary | ICD-10-CM | POA: Insufficient documentation

## 2019-12-06 DIAGNOSIS — Z79899 Other long term (current) drug therapy: Secondary | ICD-10-CM | POA: Diagnosis not present

## 2019-12-06 DIAGNOSIS — E119 Type 2 diabetes mellitus without complications: Secondary | ICD-10-CM | POA: Insufficient documentation

## 2019-12-06 DIAGNOSIS — J439 Emphysema, unspecified: Secondary | ICD-10-CM | POA: Diagnosis not present

## 2019-12-06 DIAGNOSIS — Z87891 Personal history of nicotine dependence: Secondary | ICD-10-CM | POA: Diagnosis not present

## 2019-12-06 DIAGNOSIS — C439 Malignant melanoma of skin, unspecified: Secondary | ICD-10-CM | POA: Diagnosis not present

## 2019-12-06 DIAGNOSIS — C9110 Chronic lymphocytic leukemia of B-cell type not having achieved remission: Secondary | ICD-10-CM | POA: Diagnosis not present

## 2019-12-06 DIAGNOSIS — Z7984 Long term (current) use of oral hypoglycemic drugs: Secondary | ICD-10-CM | POA: Diagnosis not present

## 2019-12-08 DIAGNOSIS — L570 Actinic keratosis: Secondary | ICD-10-CM | POA: Diagnosis not present

## 2019-12-08 DIAGNOSIS — X32XXXD Exposure to sunlight, subsequent encounter: Secondary | ICD-10-CM | POA: Diagnosis not present

## 2019-12-08 DIAGNOSIS — C44229 Squamous cell carcinoma of skin of left ear and external auricular canal: Secondary | ICD-10-CM | POA: Diagnosis not present

## 2019-12-09 ENCOUNTER — Telehealth: Payer: Self-pay | Admitting: *Deleted

## 2019-12-09 ENCOUNTER — Other Ambulatory Visit: Payer: Self-pay | Admitting: Hematology

## 2019-12-09 DIAGNOSIS — C4330 Malignant melanoma of unspecified part of face: Secondary | ICD-10-CM

## 2019-12-09 NOTE — Progress Notes (Unsigned)
Plastic sur

## 2019-12-09 NOTE — Telephone Encounter (Signed)
Patient called - he would like to know who he is being referred to. He called Dr. Hubbard Hartshorn office ([facility]) and they told him they do not do the type of surgery that involves dye. He spoke with them, they only did Mohs. Dr. Irene Limbo informed. Dr. Grier Mitts response: placed order for urgent referral to Dr Marla Roe -- please let schedulers know and inform pts daughter about referral and to call us if she does not hear about an appointment in 1 week.  We would like to do a phone visit about 2 weeks after his surgery if Sentinel LN biopsy is done. Contacted Dr. Eusebio Friendly office. They will make appt asap. Contacted patient/daughter to give this information. Ms. Orlinda Blalock reports father has appt at 8am in the morning with Dr. Marla Roe.

## 2019-12-10 ENCOUNTER — Ambulatory Visit: Payer: PPO | Admitting: Plastic Surgery

## 2019-12-10 ENCOUNTER — Other Ambulatory Visit: Payer: Self-pay

## 2019-12-10 ENCOUNTER — Encounter: Payer: Self-pay | Admitting: Plastic Surgery

## 2019-12-10 VITALS — BP 170/72 | HR 65 | Temp 97.8°F | Ht 71.0 in | Wt 185.0 lb

## 2019-12-10 DIAGNOSIS — C439 Malignant melanoma of skin, unspecified: Secondary | ICD-10-CM

## 2019-12-10 DIAGNOSIS — C9110 Chronic lymphocytic leukemia of B-cell type not having achieved remission: Secondary | ICD-10-CM | POA: Diagnosis not present

## 2019-12-10 DIAGNOSIS — E119 Type 2 diabetes mellitus without complications: Secondary | ICD-10-CM

## 2019-12-10 NOTE — Progress Notes (Addendum)
[name], male    DOB: 1928-03-25, 86 y.o.   MRN: [id number]   Chief Complaint  Patient presents with  . Skin Problem    The patient is an 86 year old white male here with his daughter.  He has a very strong history of skin cancer.  He sees the dermatologist regularly.  At his last visit he underwent a shave biopsy of a pigmented lesion on the left medial forehead.  This was done on 11/12/2019.  The pathology showed superficial spreading melanoma with 2.7 mm thickness and a Clark level III.  There was 7 mitotic index no lymphovascular invasion, tumor infiltrating lymphocytes were present.  He also had a right temple shave biopsy which was positive for basal cell carcinoma and deep margins were involved.  This was handled by the dermatologist with C&D 3 times.  His oncologist is Dr. Irene Limbo, who would like updates on progress.  He has chronic lymphocytic leukemia (2017), hyperlipidemia and diabetes.  These seem to be currently under control.  He has multiple areas of hypopigmentation and scarring on his face from previous excisions.  He also sees a urologist for intermittent hematuria.  The biopsy site on his forehead appears to be healing nicely.  It is 1.5 cm in size.  No sign of infection and no draining.  He has a history of smoking but is not currently using tobacco.   Review of Systems  Constitutional: Negative for activity change and appetite change.  HENT: Negative.   Respiratory: Negative for chest tightness.   Cardiovascular: Negative.   Endocrine: Negative.   Genitourinary: Positive for hematuria.  Musculoskeletal: Negative.   Skin: Positive for color change and wound.    Past Medical History:  Diagnosis Date  . BPH (benign prostatic hyperplasia) 07/25/2016  . CLL (chronic lymphocytic leukemia) ([place])   . Hypertension   . Type 2 diabetes mellitus without complication, without long-term current use of insulin ([place]) 07/25/2016    Past Surgical History:  Procedure  Laterality Date  . PROSTATE SURGERY        Current Outpatient Medications:  .  losartan-hydrochlorothiazide (HYZAAR) 100-25 MG tablet, Take by mouth., Disp: , Rfl:  .  metFORMIN (GLUCOPHAGE-XR) 500 MG 24 hr tablet, Take by mouth., Disp: , Rfl:  .  metoprolol (LOPRESSOR) 50 MG tablet, Take by mouth., Disp: , Rfl:  .  pravastatin (PRAVACHOL) 40 MG tablet, Take by mouth., Disp: , Rfl:    Objective:   Vitals:   12/10/19 0825  BP: (!) 170/72  Pulse: 65  Temp: 97.8 F (36.6 C)  SpO2: 97%    Physical Exam Vitals and nursing note reviewed.  HENT:     Head: Normocephalic and atraumatic.   Cardiovascular:     Rate and Rhythm: Normal rate.  Pulmonary:     Effort: Pulmonary effort is normal. No respiratory distress.  Abdominal:     General: Abdomen is flat. There is no distension.     Tenderness: There is no abdominal tenderness.  Musculoskeletal:     Cervical back: No tenderness.  Lymphadenopathy:     Cervical: No cervical adenopathy.  Skin:    General: Skin is warm.     Capillary Refill: Capillary refill takes less than 2 seconds.  Neurological:     Mental Status: He is alert and oriented to person, place, and time.  Psychiatric:        Mood and Affect: Mood normal.        Behavior: Behavior normal.  Assessment & Plan:  Type 2 diabetes mellitus without complication, without long-term current use of insulin (HCC)  Melanoma of skin (HCC)  CLL (chronic lymphocytic leukemia) ([place])  I have personally reviewed the pathology which I have described above.  We will refer the patient to Dr. Constance Holster for further evaluation of whether or not he would like to do lymph node dissection or biopsy.  We will coordinate with him on excision and repair. Most likely it will be excision of forehead melanoma with full-thickness skin graft placement. Pictures were obtained of the patient and placed in the chart with the patient's or guardian's permission.  [place], DO

## 2019-12-10 NOTE — H&P (View-Only) (Signed)
[name], male    DOB: 06-17-1928, 86 y.o.   MRN: [id number]   Chief Complaint  Patient presents with  . Skin Problem    The patient is an 86 year old white male here with his daughter.  He has a very strong history of skin cancer.  He sees the dermatologist regularly.  At his last visit he underwent a shave biopsy of a pigmented lesion on the left medial forehead.  This was done on 11/12/2019.  The pathology showed superficial spreading melanoma with 2.7 mm thickness and a Clark level III.  There was 7 mitotic index no lymphovascular invasion, tumor infiltrating lymphocytes were present.  He also had a right temple shave biopsy which was positive for basal cell carcinoma and deep margins were involved.  This was handled by the dermatologist with C&D 3 times.  His oncologist is Dr. Irene Limbo, who would like updates on progress.  He has chronic lymphocytic leukemia (2017), hyperlipidemia and diabetes.  These seem to be currently under control.  He has multiple areas of hypopigmentation and scarring on his face from previous excisions.  He also sees a urologist for intermittent hematuria.  The biopsy site on his forehead appears to be healing nicely.  It is 1.5 cm in size.  No sign of infection and no draining.  He has a history of smoking but is not currently using tobacco.   Review of Systems  Constitutional: Negative for activity change and appetite change.  HENT: Negative.   Respiratory: Negative for chest tightness.   Cardiovascular: Negative.   Endocrine: Negative.   Genitourinary: Positive for hematuria.  Musculoskeletal: Negative.   Skin: Positive for color change and wound.    Past Medical History:  Diagnosis Date  . BPH (benign prostatic hyperplasia) 07/25/2016  . CLL (chronic lymphocytic leukemia) ([place])   . Hypertension   . Type 2 diabetes mellitus without complication, without long-term current use of insulin ([place]) 07/25/2016    Past Surgical History:  Procedure  Laterality Date  . PROSTATE SURGERY        Current Outpatient Medications:  .  losartan-hydrochlorothiazide (HYZAAR) 100-25 MG tablet, Take by mouth., Disp: , Rfl:  .  metFORMIN (GLUCOPHAGE-XR) 500 MG 24 hr tablet, Take by mouth., Disp: , Rfl:  .  metoprolol (LOPRESSOR) 50 MG tablet, Take by mouth., Disp: , Rfl:  .  pravastatin (PRAVACHOL) 40 MG tablet, Take by mouth., Disp: , Rfl:    Objective:   Vitals:   12/10/19 0825  BP: (!) 170/72  Pulse: 65  Temp: 97.8 F (36.6 C)  SpO2: 97%    Physical Exam Vitals and nursing note reviewed.  HENT:     Head: Normocephalic and atraumatic.   Cardiovascular:     Rate and Rhythm: Normal rate.  Pulmonary:     Effort: Pulmonary effort is normal. No respiratory distress.  Abdominal:     General: Abdomen is flat. There is no distension.     Tenderness: There is no abdominal tenderness.  Musculoskeletal:     Cervical back: No tenderness.  Lymphadenopathy:     Cervical: No cervical adenopathy.  Skin:    General: Skin is warm.     Capillary Refill: Capillary refill takes less than 2 seconds.  Neurological:     Mental Status: He is alert and oriented to person, place, and time.  Psychiatric:        Mood and Affect: Mood normal.        Behavior: Behavior normal.  Assessment & Plan:  Type 2 diabetes mellitus without complication, without long-term current use of insulin (HCC)  Melanoma of skin (HCC)  CLL (chronic lymphocytic leukemia) ([place])  I have personally reviewed the pathology which I have described above.  We will refer the patient to Dr. Constance Holster for further evaluation of whether or not he would like to do lymph node dissection or biopsy.  We will coordinate with him on excision and repair. Most likely it will be excision of forehead melanoma with full-thickness skin graft placement. Pictures were obtained of the patient and placed in the chart with the patient's or guardian's permission.  [place], DO

## 2019-12-15 DIAGNOSIS — H35033 Hypertensive retinopathy, bilateral: Secondary | ICD-10-CM | POA: Diagnosis not present

## 2019-12-15 DIAGNOSIS — H2513 Age-related nuclear cataract, bilateral: Secondary | ICD-10-CM | POA: Diagnosis not present

## 2019-12-15 DIAGNOSIS — H02135 Senile ectropion of left lower eyelid: Secondary | ICD-10-CM | POA: Diagnosis not present

## 2019-12-15 DIAGNOSIS — E119 Type 2 diabetes mellitus without complications: Secondary | ICD-10-CM | POA: Diagnosis not present

## 2019-12-15 DIAGNOSIS — H52223 Regular astigmatism, bilateral: Secondary | ICD-10-CM | POA: Diagnosis not present

## 2019-12-15 DIAGNOSIS — H524 Presbyopia: Secondary | ICD-10-CM | POA: Diagnosis not present

## 2019-12-21 DIAGNOSIS — C434 Malignant melanoma of scalp and neck: Secondary | ICD-10-CM | POA: Diagnosis not present
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
## 2019-12-23 ENCOUNTER — Other Ambulatory Visit: Payer: Self-pay

## 2019-12-23 ENCOUNTER — Encounter: Payer: PPO | Admitting: Plastic Surgery

## 2019-12-23 MED ORDER — METOPROLOL TARTRATE 50 MG PO TABS: 50.0000 mg | ORAL_TABLET | Freq: Three times a day (TID) | ORAL | 1 refills | Status: DC

## 2019-12-29 ENCOUNTER — Other Ambulatory Visit (HOSPITAL_COMMUNITY): Payer: Self-pay | Admitting: Otolaryngology

## 2019-12-29 ENCOUNTER — Other Ambulatory Visit: Payer: Self-pay | Admitting: Otolaryngology

## 2019-12-29 DIAGNOSIS — C434 Malignant melanoma of scalp and neck: Secondary | ICD-10-CM

## 2019-12-30 ENCOUNTER — Other Ambulatory Visit (HOSPITAL_COMMUNITY): Payer: PPO

## 2019-12-31 ENCOUNTER — Other Ambulatory Visit: Payer: Self-pay

## 2019-12-31 ENCOUNTER — Encounter (HOSPITAL_COMMUNITY)
Admission: RE | Admit: 2019-12-31 | Discharge: 2019-12-31 | Disposition: A | Discharge status: Home / Self Care | Payer: PPO | Source: Ambulatory Visit | Attending: Otolaryngology | Admitting: Otolaryngology

## 2019-12-31 DIAGNOSIS — C434 Malignant melanoma of scalp and neck: Secondary | ICD-10-CM | POA: Insufficient documentation

## 2019-12-31 MED ORDER — TECHNETIUM TC 99M SULFUR COLLOID FILTERED
0.5000 | Freq: Once | INTRAVENOUS | Status: AC | PRN
  Administered 2019-12-31: 0.5 via INTRADERMAL

## 2020-01-03 ENCOUNTER — Telehealth: Payer: Self-pay | Admitting: Surgical

## 2020-01-03 ENCOUNTER — Other Ambulatory Visit (HOSPITAL_COMMUNITY)
Admission: RE | Admit: 2020-01-03 | Discharge: 2020-01-03 | Disposition: A | Discharge status: Home / Self Care | Payer: PPO | Source: Ambulatory Visit | Attending: Otolaryngology | Admitting: Otolaryngology

## 2020-01-03 DIAGNOSIS — Z20822 Contact with and (suspected) exposure to covid-19: Secondary | ICD-10-CM | POA: Insufficient documentation

## 2020-01-03 DIAGNOSIS — Z01812 Encounter for preprocedural laboratory examination: Secondary | ICD-10-CM | POA: Insufficient documentation

## 2020-01-03 LAB — SARS CORONAVIRUS 2 (TAT 6-24 HRS): SARS Coronavirus 2: NEGATIVE

## 2020-01-03 NOTE — H&P (Signed)
HPI:   Thomas Zavala is a 86 y.o. male who presents as a consult Patient.   Referring Provider: Scheryl Marten*  Chief complaint:: Melanoma.  HPI: Long history of multiple skin cancers. He is of [place] descent. Most recent lesion of the left of midline frontal scalp revealed a 2.7 mm thick melanoma.  PMH/Meds/All/SocHx/FamHx/ROS:   Past Medical History:  Diagnosis Date  . Cancer ([place])  . Diabetes mellitus ([place])   Past Surgical History:  Procedure Laterality Date  . MOHS SURGERY   No family history of bleeding disorders, wound healing problems or difficulty with anesthesia.   Social History   Socioeconomic History  . Marital status: Widowed  Spouse name: Not on file  . Number of children: Not on file  . Years of education: Not on file  . Highest education level: Not on file  Occupational History  . Not on file  Tobacco Use  . Smoking status: Former Smoker  Quit date: 1975  Years since quitting: 46.3  . Smokeless tobacco: Never Used  Substance and Sexual Activity  . Alcohol use: Not on file  . Drug use: Not on file  . Sexual activity: Not on file  Other Topics Concern  . Not on file  Social History Narrative  . Not on file   Social Determinants of Health   Financial Resource Strain:  . Difficulty of Paying Living Expenses:  Food Insecurity:  . Worried About Charity fundraiser in the Last Year:  . Arboriculturist in the Last Year:  Transportation Needs:  . Film/video editor (Medical):  [name] Lack of Transportation (Non-Medical):  Physical Activity:  . Days of Exercise per Week:  . Minutes of Exercise per Session:  Stress:  . Feeling of Stress :  Social Connections:  . Frequency of Communication with Friends and Family:  . Frequency of Social Gatherings with Friends and Family:  . Attends Religious Services:  . Active Member of Clubs or Organizations:  . Attends Archivist Meetings:  [name] Marital Status:   Current  Outpatient Medications:  . blood sugar diagnostic (GLUCOSE BLOOD) Strp test strips, Use as directed. Pharmacy, please dispense this brand of blood glucose test strips: OneTouch, Disp: , Rfl:  . losartan-hydrochlorothiazide (HYZAAR) 100-25 mg per tablet, , Disp: , Rfl:  . metFORMIN ER (GLUCOPHAGE-XR) 500 MG extended release tablet, TAKE ONE TABLET BY MOUTH DAILY., Disp: , Rfl:  . metoPROLOL tartrate (LOPRESSOR) 50 MG tablet, TAKE ONE TABLET BY MOUTH 3 TIMES A DAY., Disp: , Rfl:  . pravastatin (PRAVACHOL) 40 MG tablet, TAKE ONE TABLET (40 MG DOSE) BY MOUTH DAILY., Disp: , Rfl:   A complete ROS was performed with pertinent positives/negatives noted in the HPI. The remainder of the ROS are negative.   Physical Exam:   BP 149/73  Pulse 68  Temp 96.9 F (36.1 C)  Ht 1.803 m (5\' 11" )  Wt 85.3 kg (188 lb)  BMI 26.22 kg/m   General: Healthy and alert, in no distress, breathing easily. Normal affect. In a pleasant mood. Head: Normocephalic, atraumatic. No masses, or scars. Eyes: Pupils are equal, and reactive to light. Vision is grossly intact. No spontaneous or gaze nystagmus. Ears: Ear canals are clear. Tympanic membranes are intact, with normal landmarks and the middle ears are clear and healthy. Hearing: Grossly normal. Nose: Nasal cavities are clear with healthy mucosa, no polyps or exudate. Airways are patent. Face: Multiple scars throughout the face and neck, facial nerve function is  symmetric. Chronic ectropion left eye. Oral Cavity: No mucosal abnormalities are noted. Tongue with normal mobility. Dentition appears healthy. Oropharynx: Tonsils are symmetric. There are no mucosal masses identified. Tongue base appears normal and healthy. Larynx/Hypopharynx: deferred Chest: Deferred Neck: No palpable masses, no cervical adenopathy, no thyroid nodules or enlargement. Neuro: Cranial nerves II-XII with normal function. Balance: Normal gate. Other findings: none.  Independent Review of  Additional Tests or Records:  none  Procedures:  none  Impression & Plans:  Frontal scalp melanoma. Dr. Marla Roe is going to perform the wide excision and reconstruction. I am going to order a lymphoscintigram to further evaluate the location of sentinel lymph node (s). We will discuss the surgical intervention following that.

## 2020-01-03 NOTE — Telephone Encounter (Signed)
Called to speak with Mr. Bosscher daughter, she notes that she is his medical power of attorney.  We discussed the upcoming procedure which included excision of forehead melanoma followed by full-thickness skin graft.  We also discussed his ectropion.  Daughter reports that Mr. Haberland is not currently available as he is getting his Covid test.  I answered all of her questions associated with surgery and we discussed the postoperative plan.  I recommend she call with any further questions or concerns.

## 2020-01-04 ENCOUNTER — Encounter (HOSPITAL_COMMUNITY): Payer: Self-pay | Admitting: Otolaryngology

## 2020-01-04 ENCOUNTER — Other Ambulatory Visit: Payer: Self-pay | Admitting: Otolaryngology

## 2020-01-04 ENCOUNTER — Other Ambulatory Visit: Payer: Self-pay

## 2020-01-04 NOTE — Progress Notes (Signed)
SDW-pre-op call completed by both pt and daughter, Lanelle Bal ( with pt verbal consent). Pt denies SOB and chest pian. Pt stated that he is under the care of Dr. Einar Gip, Cardiology and Dr. Ledora Bottcher, PCP. Pt denies having a stress test, echo and cardiac cath. Pt denies having a chest x ray in the last year. Pt denies recent labs. Pt made aware to hold Metformin DOS. Pt made aware to check CBG every 2 hours prior to arrival to hospital on DOS. Pt made aware to treat a CBG < 70 with 4 ounces of apple or cranberry juice, wait 15 minutes after intervention to recheck BG, if BG remains < 70, call Short Stay unit to speak with a nurse. Pt reminded to quarantine. Both pt and daughter verbalized understanding of all pre-op instructions.

## 2020-01-05 ENCOUNTER — Ambulatory Visit (HOSPITAL_COMMUNITY): Payer: PPO | Admitting: Anesthesiology

## 2020-01-05 ENCOUNTER — Observation Stay (HOSPITAL_COMMUNITY)
Admission: RE | Admit: 2020-01-05 | Discharge: 2020-01-06 | Disposition: A | Discharge status: Home / Self Care | Payer: PPO | Source: Ambulatory Visit | Attending: Otolaryngology | Admitting: Otolaryngology

## 2020-01-05 ENCOUNTER — Other Ambulatory Visit: Payer: Self-pay

## 2020-01-05 ENCOUNTER — Encounter (HOSPITAL_COMMUNITY)
Admission: RE | Disposition: A | Discharge status: Home / Self Care | Payer: Self-pay | Source: Ambulatory Visit | Attending: Otolaryngology

## 2020-01-05 ENCOUNTER — Encounter (HOSPITAL_COMMUNITY)
Admission: RE | Admit: 2020-01-05 | Discharge: 2020-01-05 | Disposition: A | Discharge status: Home / Self Care | Payer: PPO | Source: Ambulatory Visit | Attending: Otolaryngology | Admitting: Otolaryngology

## 2020-01-05 ENCOUNTER — Encounter (HOSPITAL_COMMUNITY): Payer: Self-pay | Admitting: Otolaryngology

## 2020-01-05 DIAGNOSIS — Z79899 Other long term (current) drug therapy: Secondary | ICD-10-CM | POA: Diagnosis not present

## 2020-01-05 DIAGNOSIS — E785 Hyperlipidemia, unspecified: Secondary | ICD-10-CM | POA: Insufficient documentation

## 2020-01-05 DIAGNOSIS — C434 Malignant melanoma of scalp and neck: Secondary | ICD-10-CM | POA: Diagnosis present

## 2020-01-05 DIAGNOSIS — C9110 Chronic lymphocytic leukemia of B-cell type not having achieved remission: Secondary | ICD-10-CM | POA: Insufficient documentation

## 2020-01-05 DIAGNOSIS — E119 Type 2 diabetes mellitus without complications: Secondary | ICD-10-CM | POA: Insufficient documentation

## 2020-01-05 DIAGNOSIS — Z87891 Personal history of nicotine dependence: Secondary | ICD-10-CM | POA: Diagnosis not present

## 2020-01-05 DIAGNOSIS — C439 Malignant melanoma of skin, unspecified: Secondary | ICD-10-CM | POA: Diagnosis not present

## 2020-01-05 DIAGNOSIS — K118 Other diseases of salivary glands: Secondary | ICD-10-CM | POA: Diagnosis not present

## 2020-01-05 DIAGNOSIS — I10 Essential (primary) hypertension: Secondary | ICD-10-CM | POA: Insufficient documentation

## 2020-01-05 DIAGNOSIS — Z7984 Long term (current) use of oral hypoglycemic drugs: Secondary | ICD-10-CM | POA: Diagnosis not present

## 2020-01-05 DIAGNOSIS — C4339 Malignant melanoma of other parts of face: Secondary | ICD-10-CM | POA: Diagnosis present

## 2020-01-05 DIAGNOSIS — C07 Malignant neoplasm of parotid gland: Secondary | ICD-10-CM | POA: Diagnosis not present

## 2020-01-05 DIAGNOSIS — D034 Melanoma in situ of scalp and neck: Secondary | ICD-10-CM | POA: Diagnosis not present

## 2020-01-05 HISTORY — DX: Ventricular premature depolarization: I49.3

## 2020-01-05 HISTORY — DX: Unspecified abdominal hernia without obstruction or gangrene: K46.9

## 2020-01-05 HISTORY — PX: SENTINEL NODE BIOPSY: SHX6608

## 2020-01-05 HISTORY — DX: Malignant melanoma of scalp and neck: C43.4

## 2020-01-05 HISTORY — PX: MASS EXCISION: SHX2000

## 2020-01-05 HISTORY — PX: PAROTIDECTOMY: SHX2163

## 2020-01-05 HISTORY — DX: Malignant melanoma of other parts of face: C43.39

## 2020-01-05 HISTORY — PX: IRRIGATION AND DEBRIDEMENT OF WOUND WITH SPLIT THICKNESS SKIN GRAFT: SHX5879

## 2020-01-05 LAB — GLUCOSE, CAPILLARY
Glucose-Capillary: 128 mg/dL — ABNORMAL HIGH (ref 70–99)
Glucose-Capillary: 125 mg/dL — ABNORMAL HIGH (ref 70–99)
Glucose-Capillary: 143 mg/dL — ABNORMAL HIGH (ref 70–99)

## 2020-01-05 LAB — BASIC METABOLIC PANEL
Anion gap: 14 (ref 5–15)
BUN: 9 mg/dL (ref 8–23)
CO2: 24 mmol/L (ref 22–32)
Calcium: 9.1 mg/dL (ref 8.9–10.3)
Chloride: 89 mmol/L — ABNORMAL LOW (ref 98–111)
Creatinine, Ser: 1.06 mg/dL (ref 0.61–1.24)
GFR calc Af Amer: >60 mL/min (ref >60)
GFR calc non Af Amer: >60 mL/min (ref >60)
Glucose, Bld: 146 mg/dL — ABNORMAL HIGH (ref 70–99)
Potassium: 5.2 mmol/L — ABNORMAL HIGH (ref 3.5–5.1)
Sodium: 127 mmol/L — ABNORMAL LOW (ref 135–145)

## 2020-01-05 LAB — CBC
HCT: 48.2 % (ref 39.0–52.0)
Hemoglobin: 16.2 g/dL (ref 13.0–17.0)
MCH: 34.1 pg — ABNORMAL HIGH (ref 26.0–34.0)
MCHC: 33.6 g/dL (ref 30.0–36.0)
MCV: 101.5 fL — ABNORMAL HIGH (ref 80.0–100.0)
Platelets: 100 10*3/uL — ABNORMAL LOW (ref 150–400)
RBC: 4.75 MIL/uL (ref 4.22–5.81)
RDW: 13.9 % (ref 11.5–15.5)
WBC: 80.9 10*3/uL (ref 4.0–10.5)
nRBC: 0 % (ref 0.0–0.2)

## 2020-01-05 SURGERY — EXCISION, PAROTID GLAND
Anesthesia: General | Site: Shoulder

## 2020-01-05 MED ORDER — TOBRAMYCIN-DEXAMETHASONE 0.3-0.1 % OP OINT
TOPICAL_OINTMENT | OPHTHALMIC | Status: AC
  Filled 2020-01-05: qty 3.5

## 2020-01-05 MED ORDER — LOSARTAN POTASSIUM-HCTZ 100-25 MG PO TABS: 1.0000 | ORAL_TABLET | Freq: Every day | ORAL | Status: DC

## 2020-01-05 MED ORDER — FENTANYL CITRATE (PF) 100 MCG/2ML IJ SOLN: 50.0000 ug | Freq: Once | INTRAMUSCULAR | Status: AC

## 2020-01-05 MED ORDER — CARBOXYMETHYLCELLUL-GLYCERIN 0.5-0.9 % OP SOLN: 1.0000 [drp] | Freq: Every day | OPHTHALMIC | Status: DC | PRN

## 2020-01-05 MED ORDER — LIDOCAINE-EPINEPHRINE 1 %-1:100000 IJ SOLN
INTRAMUSCULAR | Status: DC | PRN
  Administered 2020-01-05: 20 mL

## 2020-01-05 MED ORDER — LOSARTAN POTASSIUM 50 MG PO TABS
100.0000 mg | ORAL_TABLET | Freq: Every day | ORAL | Status: DC
  Administered 2020-01-06: 100 mg via ORAL
  Filled 2020-01-05 (×2): qty 2

## 2020-01-05 MED ORDER — PROPOFOL 10 MG/ML IV BOLUS
INTRAVENOUS | Status: DC | PRN
  Administered 2020-01-05: 140 mg via INTRAVENOUS

## 2020-01-05 MED ORDER — LACTATED RINGERS IV SOLN: INTRAVENOUS | Status: DC

## 2020-01-05 MED ORDER — CHLORHEXIDINE GLUCONATE CLOTH 2 % EX PADS: 6.0000 | MEDICATED_PAD | Freq: Once | CUTANEOUS | Status: DC

## 2020-01-05 MED ORDER — PHENYLEPHRINE HCL-NACL 10-0.9 MG/250ML-% IV SOLN
INTRAVENOUS | Status: DC | PRN
  Administered 2020-01-05: 25 ug/min via INTRAVENOUS

## 2020-01-05 MED ORDER — TECHNETIUM TC 99M SULFUR COLLOID FILTERED
0.5000 | Freq: Once | INTRAVENOUS | Status: AC | PRN
  Administered 2020-01-05: 0.5 via INTRADERMAL

## 2020-01-05 MED ORDER — CEFAZOLIN SODIUM-DEXTROSE 2-4 GM/100ML-% IV SOLN
2.0000 g | INTRAVENOUS | Status: AC
  Administered 2020-01-05: 2 g via INTRAVENOUS

## 2020-01-05 MED ORDER — METOPROLOL TARTRATE 50 MG PO TABS
50.0000 mg | ORAL_TABLET | Freq: Three times a day (TID) | ORAL | Status: DC
  Administered 2020-01-05 – 2020-01-06 (×3): 50 mg via ORAL
  Filled 2020-01-05 (×3): qty 1

## 2020-01-05 MED ORDER — PROPOFOL 10 MG/ML IV BOLUS
INTRAVENOUS | Status: AC
  Filled 2020-01-05: qty 20

## 2020-01-05 MED ORDER — FENTANYL CITRATE (PF) 250 MCG/5ML IJ SOLN
INTRAMUSCULAR | Status: AC
  Filled 2020-01-05: qty 5

## 2020-01-05 MED ORDER — PROMETHAZINE HCL 25 MG RE SUPP
25.0000 mg | Freq: Four times a day (QID) | RECTAL | Status: DC | PRN
  Filled 2020-01-05: qty 1

## 2020-01-05 MED ORDER — HYPROMELLOSE (GONIOSCOPIC) 2.5 % OP SOLN
1.0000 [drp] | OPHTHALMIC | Status: DC | PRN
  Filled 2020-01-05: qty 15

## 2020-01-05 MED ORDER — FENTANYL CITRATE (PF) 100 MCG/2ML IJ SOLN: 25.0000 ug | INTRAMUSCULAR | Status: DC | PRN

## 2020-01-05 MED ORDER — BUPIVACAINE HCL (PF) 0.25 % IJ SOLN
INTRAMUSCULAR | Status: AC
  Filled 2020-01-05: qty 30

## 2020-01-05 MED ORDER — TOBRAMYCIN 0.3 % OP OINT
TOPICAL_OINTMENT | OPHTHALMIC | Status: DC | PRN
  Administered 2020-01-05: 1 via OPHTHALMIC

## 2020-01-05 MED ORDER — PRAVASTATIN SODIUM 40 MG PO TABS
40.0000 mg | ORAL_TABLET | Freq: Every day | ORAL | Status: DC
  Administered 2020-01-06: 40 mg via ORAL
  Filled 2020-01-05 (×2): qty 1

## 2020-01-05 MED ORDER — EPINEPHRINE PF 1 MG/ML IJ SOLN
INTRAMUSCULAR | Status: AC
  Filled 2020-01-05: qty 1

## 2020-01-05 MED ORDER — FENTANYL CITRATE (PF) 100 MCG/2ML IJ SOLN
INTRAMUSCULAR | Status: DC | PRN
  Administered 2020-01-05: 50 ug via INTRAVENOUS
  Administered 2020-01-05: 100 ug via INTRAVENOUS

## 2020-01-05 MED ORDER — SODIUM CHLORIDE 0.9 % IV SOLN
INTRAVENOUS | Status: AC
  Filled 2020-01-05: qty 500000

## 2020-01-05 MED ORDER — FENTANYL CITRATE (PF) 100 MCG/2ML IJ SOLN
INTRAMUSCULAR | Status: AC
  Administered 2020-01-05: 50 ug via INTRAVENOUS
  Filled 2020-01-05: qty 2

## 2020-01-05 MED ORDER — METFORMIN HCL ER 500 MG PO TB24
500.0000 mg | ORAL_TABLET | Freq: Every day | ORAL | Status: DC
  Administered 2020-01-06: 500 mg via ORAL
  Filled 2020-01-05 (×2): qty 1

## 2020-01-05 MED ORDER — IBUPROFEN 100 MG/5ML PO SUSP: 400.0000 mg | Freq: Four times a day (QID) | ORAL | Status: DC | PRN

## 2020-01-05 MED ORDER — PROMETHAZINE HCL 25 MG PO TABS
25.0000 mg | ORAL_TABLET | Freq: Four times a day (QID) | ORAL | Status: DC | PRN
  Administered 2020-01-06: 25 mg via ORAL
  Filled 2020-01-05: qty 1

## 2020-01-05 MED ORDER — LIDOCAINE 2% (20 MG/ML) 5 ML SYRINGE
INTRAMUSCULAR | Status: DC | PRN
  Administered 2020-01-05: 30 mg via INTRAVENOUS

## 2020-01-05 MED ORDER — SODIUM CHLORIDE 0.9 % IV SOLN
INTRAVENOUS | Status: DC | PRN
Start: 2020-01-05 — End: 2020-01-05

## 2020-01-05 MED ORDER — POTASSIUM CHLORIDE 2 MEQ/ML IV SOLN
INTRAVENOUS | Status: DC
Start: 2020-01-05 — End: 2020-01-05

## 2020-01-05 MED ORDER — SUCCINYLCHOLINE CHLORIDE 20 MG/ML IJ SOLN
INTRAMUSCULAR | Status: DC | PRN
  Administered 2020-01-05: 100 mg via INTRAVENOUS

## 2020-01-05 MED ORDER — 0.9 % SODIUM CHLORIDE (POUR BTL) OPTIME
TOPICAL | Status: DC | PRN
  Administered 2020-01-05: 1000 mL

## 2020-01-05 MED ORDER — SODIUM CHLORIDE 0.9 % IV SOLN
INTRAVENOUS | Status: DC | PRN
  Administered 2020-01-05: 500 mL

## 2020-01-05 MED ORDER — DEXAMETHASONE SODIUM PHOSPHATE 10 MG/ML IJ SOLN
INTRAMUSCULAR | Status: DC | PRN
  Administered 2020-01-05: 10 mg via INTRAVENOUS

## 2020-01-05 MED ORDER — GLYCOPYRROLATE 0.2 MG/ML IJ SOLN
INTRAMUSCULAR | Status: DC | PRN
  Administered 2020-01-05: .2 mg via INTRAVENOUS

## 2020-01-05 MED ORDER — CEFAZOLIN SODIUM-DEXTROSE 2-4 GM/100ML-% IV SOLN
INTRAVENOUS | Status: AC
  Filled 2020-01-05: qty 100

## 2020-01-05 MED ORDER — HYDROCODONE-ACETAMINOPHEN 5-325 MG PO TABS: 1.0000 | ORAL_TABLET | ORAL | Status: DC | PRN

## 2020-01-05 MED ORDER — LIDOCAINE-EPINEPHRINE 1 %-1:100000 IJ SOLN
INTRAMUSCULAR | Status: AC
  Filled 2020-01-05: qty 1

## 2020-01-05 MED ORDER — HYDROCHLOROTHIAZIDE 25 MG PO TABS
25.0000 mg | ORAL_TABLET | Freq: Every day | ORAL | Status: DC
  Administered 2020-01-06: 25 mg via ORAL
  Filled 2020-01-05 (×2): qty 1

## 2020-01-05 MED ORDER — HYPROMELLOSE (GONIOSCOPIC) 2.5 % OP SOLN
1.0000 [drp] | Freq: Every day | OPHTHALMIC | Status: DC | PRN
  Filled 2020-01-05: qty 15

## 2020-01-05 SURGICAL SUPPLY — 91 items
ATTRACTOMAT 16X20 MAGNETIC DRP (DRAPES) ×2 IMPLANT
BLADE CLIPPER SURG (BLADE) IMPLANT
BLADE DERMATOME SS (BLADE) ×2 IMPLANT
BLADE SURG 15 STRL LF DISP TIS (BLADE) ×6 IMPLANT
BLADE SURG 15 STRL SS (BLADE) ×4
CANISTER SUCT 3000ML PPV (MISCELLANEOUS) ×5 IMPLANT
CLEANER TIP ELECTROSURG 2X2 (MISCELLANEOUS) ×5 IMPLANT
CLOSURE STERI-STRIP 1/4X4 (GAUZE/BANDAGES/DRESSINGS) ×2 IMPLANT
CLOSURE WOUND 1/2 X4 (GAUZE/BANDAGES/DRESSINGS)
CNTNR URN SCR LID CUP LEK RST (MISCELLANEOUS) IMPLANT
CONT SPEC 4OZ STRL OR WHT (MISCELLANEOUS)
CORD BIPOLAR FORCEPS 12FT (ELECTRODE) ×5 IMPLANT
COTTONBALL LRG STERILE PKG (GAUZE/BANDAGES/DRESSINGS) ×2 IMPLANT
COVER MAYO STAND STRL (DRAPES) ×2 IMPLANT
COVER SURGICAL LIGHT HANDLE (MISCELLANEOUS) ×5 IMPLANT
COVER WAND RF STERILE (DRAPES) ×10 IMPLANT
DECANTER SPIKE VIAL GLASS SM (MISCELLANEOUS) ×5 IMPLANT
DERMABOND ADVANCED (GAUZE/BANDAGES/DRESSINGS) ×4
DERMABOND ADVANCED .7 DNX12 (GAUZE/BANDAGES/DRESSINGS) ×3 IMPLANT
DERMACARRIERS GRAFT 1 TO 1.5 (DISPOSABLE) ×5
DRAIN HEMOVAC 7FR (DRAIN) IMPLANT
DRAIN JACKSON PRT FLT 7MM (DRAIN) ×2 IMPLANT
DRAIN JACKSON RD 7FR 3/32 (WOUND CARE) ×4 IMPLANT
DRAIN SNY 10 ROU (WOUND CARE) IMPLANT
DRAIN WOUND SNY 15 RND (WOUND CARE) IMPLANT
DRAPE HALF SHEET 40X57 (DRAPES) IMPLANT
DRAPE INCISE 23X17 IOBAN STRL (DRAPES) ×2
DRAPE INCISE 23X17 STRL (DRAPES) ×3 IMPLANT
DRAPE INCISE IOBAN 23X17 STRL (DRAPES) ×3 IMPLANT
DRAPE LAPAROTOMY 100X72 PEDS (DRAPES) IMPLANT
DRAPE ORTHO SPLIT 77X108 STRL (DRAPES) ×4
DRAPE SURG ORHT 6 SPLT 77X108 (DRAPES) IMPLANT
DRAPE U-SHAPE 76X120 STRL (DRAPES) IMPLANT
DRSG MEPILEX BORDER 4X4 (GAUZE/BANDAGES/DRESSINGS) ×4 IMPLANT
DRSG TEGADERM 2-3/8X2-3/4 SM (GAUZE/BANDAGES/DRESSINGS) IMPLANT
ELECT CAUTERY BLADE 6.4 (BLADE) ×2 IMPLANT
ELECT COATED BLADE 2.86 ST (ELECTRODE) ×5 IMPLANT
ELECT NDL BLADE 2-5/6 (NEEDLE) IMPLANT
ELECT NEEDLE BLADE 2-5/6 (NEEDLE) IMPLANT
ELECT REM PT RETURN 9FT ADLT (ELECTROSURGICAL) ×10
ELECTRODE REM PT RTRN 9FT ADLT (ELECTROSURGICAL) ×6 IMPLANT
EVACUATOR SILICONE 100CC (DRAIN) IMPLANT
FORCEPS BIPOLAR SPETZLER 8 1.0 (NEUROSURGERY SUPPLIES) ×5 IMPLANT
GAUZE 4X4 16PLY RFD (DISPOSABLE) ×7 IMPLANT
GAUZE SPONGE 4X4 12PLY STRL LF (GAUZE/BANDAGES/DRESSINGS) ×5 IMPLANT
GAUZE XEROFORM 5X9 LF (GAUZE/BANDAGES/DRESSINGS) ×2 IMPLANT
GLOVE BIO SURGEON STRL SZ 6.5 (GLOVE) ×9 IMPLANT
GLOVE BIO SURGEONS STRL SZ 6.5 (GLOVE) ×3
GLOVE ECLIPSE 7.5 STRL STRAW (GLOVE) ×5 IMPLANT
GOWN STRL REUS W/ TWL LRG LVL3 (GOWN DISPOSABLE) ×15 IMPLANT
GOWN STRL REUS W/TWL LRG LVL3 (GOWN DISPOSABLE) ×8
GRAFT DERMACARRIERS 1 TO 1.5 (DISPOSABLE) IMPLANT
KIT BASIN OR (CUSTOM PROCEDURE TRAY) ×10 IMPLANT
KIT TURNOVER KIT B (KITS) ×5 IMPLANT
LOCATOR NERVE 3 VOLT (DISPOSABLE) IMPLANT
NDL HYPO 25GX1X1/2 BEV (NEEDLE) ×3 IMPLANT
NDL PRECISIONGLIDE 27X1.5 (NEEDLE) IMPLANT
NEEDLE HYPO 25GX1X1/2 BEV (NEEDLE) ×10 IMPLANT
NEEDLE PRECISIONGLIDE 27X1.5 (NEEDLE) IMPLANT
NS IRRIG 1000ML POUR BTL (IV SOLUTION) ×10 IMPLANT
PACK GENERAL/GYN (CUSTOM PROCEDURE TRAY) ×2 IMPLANT
PACK SURGICAL SETUP 50X90 (CUSTOM PROCEDURE TRAY) ×5 IMPLANT
PAD ARMBOARD 7.5X6 YLW CONV (MISCELLANEOUS) ×10 IMPLANT
PENCIL BUTTON HOLSTER BLD 10FT (ELECTRODE) ×5 IMPLANT
PENCIL FOOT CONTROL (ELECTRODE) ×5 IMPLANT
SHEARS HARMONIC 9CM CVD (BLADE) ×5 IMPLANT
STAPLER VISISTAT 35W (STAPLE) ×5 IMPLANT
STRIP CLOSURE SKIN 1/2X4 (GAUZE/BANDAGES/DRESSINGS) IMPLANT
SUT CHROMIC 3 0 SH 27 (SUTURE) ×3 IMPLANT
SUT CHROMIC 4 0 PS 2 18 (SUTURE) ×5 IMPLANT
SUT ETHILON 5 0 P 3 18 (SUTURE)
SUT MNCRL AB 3-0 PS2 18 (SUTURE) ×4 IMPLANT
SUT MNCRL AB 4-0 PS2 18 (SUTURE) ×4 IMPLANT
SUT MON AB 5-0 PS2 18 (SUTURE) ×2 IMPLANT
SUT NYLON ETHILON 5-0 P-3 1X18 (SUTURE) IMPLANT
SUT SILK 2 0 SH CR/8 (SUTURE) ×5 IMPLANT
SUT SILK 4 0 REEL (SUTURE) ×5 IMPLANT
SUT SILK 5 0 PS 2 (SUTURE) ×2 IMPLANT
SUT VIC AB 3-0 SH 18 (SUTURE) ×5 IMPLANT
SUT VIC AB 4-0 RB1 18 (SUTURE) ×6 IMPLANT
SUT VIC AB 4-0 SH 27 (SUTURE) ×2
SUT VIC AB 4-0 SH 27XANBCTRL (SUTURE) IMPLANT
SUT VIC AB 5-0 PS2 18 (SUTURE) ×2 IMPLANT
SYR BULB EAR ULCER 3OZ GRN STR (SYRINGE) ×5 IMPLANT
SYR CONTROL 10ML LL (SYRINGE) ×7 IMPLANT
TOWEL GREEN STERILE FF (TOWEL DISPOSABLE) ×10 IMPLANT
TRAY ENT MC OR (CUSTOM PROCEDURE TRAY) ×5 IMPLANT
TRAY FOLEY MTR SLVR 14FR STAT (SET/KITS/TRAYS/PACK) IMPLANT
TUBE CONNECTING 12'X1/4 (SUCTIONS) ×1
TUBE CONNECTING 12X1/4 (SUCTIONS) ×4 IMPLANT
YANKAUER SUCT BULB TIP NO VENT (SUCTIONS) ×5 IMPLANT

## 2020-01-05 NOTE — Op Note (Signed)
DATE OF OPERATION: 01/05/2020  LOCATION: [facility] Operating Room  PREOPERATIVE DIAGNOSIS: melanoma of forehead / scalp  POSTOPERATIVE DIAGNOSIS: Same  PROCEDURE: Excision of melanoma of forehead / scalp 4 x 6 cm with placement of Full Thickness Skin Graft 3.5 x 5 cm  SURGEON: Luke Rigsbee [place] Tomeika Weinmann, DO  ASSISTANT: Roetta Sessions, PA  EBL: 5 cc  CONDITION: Stable  COMPLICATIONS: None  INDICATION: The patient, Thomas Zavala, is a 86 y.o. male born on 09-27-1927, is here for treatment of a scalp / forehead melanoma.   PROCEDURE DETAILS:  The patient was seen prior to surgery and marked.  The IV antibiotics were given. The patient was taken to the operating room with ENT and given a general anesthetic. A standard time out was performed and all information was confirmed by those in the room. SCDs were placed.   ENT performed their portion of the case.  This is in their dictaion. Once done the patient was rendered to the plastic surgery team.  The forehead / scalp area was marked with 2 cm margins from the edge of the lesion circumferentially.  Local was injected for intraoperative hemostasis and postoperative pain control.  The left clavicular area was also marked and injected with local.  The #10 blade was used to excise the lesion.  A short silk stitch was placed at 12 o'clock and long stitch at 3 o'clock.  The 4 x 6 cm are was sent to pathology.  An additional deep margin was obtained with the ink to mark the superficial margin of the deep margin.  The superior aspect was closed with the 3-0 Monocryl.  This helped to decrease the size to 3.5 x 5 cm with undermining laterally as well.  The full thickness skin graft was obtained from the left clavicular area with a #10 blade.  Hemostasis was achieved with electrocautery.  The deep layers were closed with the 4-0 Monocryl followed by the 5-0 Monocryl.  Derma bond was applied with a steri strip and sterile dressing.  Attention was turned to the  graft.  The graft was applied to the forehead.  The 4-0 Vicryl was used to bolster the graft in place.  Two slits were placed in the center to prevent fluid accumulation.  The xeroform was used as the bolster.  A sterile dressing was applied.  The patient was allowed to wake up and taken to recovery room in stable condition at the end of the case. The family was notified at the end of the case.   The advanced practice practitioner (APP) assisted throughout the case.  The APP was essential in retraction and counter traction when needed to make the case progress smoothly.  This retraction and assistance made it possible to see the tissue plans for the procedure.  The assistance was needed for blood control, tissue re-approximation and assisted with closure of the incision site.

## 2020-01-05 NOTE — Interval H&P Note (Signed)
History and Physical Interval Note:  01/05/2020 10:43 AM  Thomas Zavala  has presented today for surgery, with the diagnosis of parotid mass.  The various methods of treatment have been discussed with the patient and family. After consideration of risks, benefits and other options for treatment, the patient has consented to  Procedure(s): PAROTIDECTOMY (Left) EXCISION OF FOREHEAD  MELANOMA (N/A) WITH SPLIT THICKNESS SKIN GRAFT (N/A) as a surgical intervention.  The patient's history has been reviewed, patient examined, no change in status, stable for surgery.  I have reviewed the patient's chart and labs.  Questions were answered to the patient's satisfaction.     Izora Gala

## 2020-01-05 NOTE — Interval H&P Note (Signed)
History and Physical Interval Note:  01/05/2020 1:03 PM  Thomas Zavala  has presented today for surgery, with the diagnosis of parotid mass.  The various methods of treatment have been discussed with the patient and family. After consideration of risks, benefits and other options for treatment, the patient has consented to  Procedure(s): PAROTIDECTOMY (Left) EXCISION OF FOREHEAD  MELANOMA (N/A) WITH SPLIT THICKNESS SKIN GRAFT (N/A) as a surgical intervention.  The patient's history has been reviewed, patient examined, no change in status, stable for surgery.  I have reviewed the patient's chart and labs.  Questions were answered to the patient's satisfaction.     Loel Lofty Cala Kruckenberg

## 2020-01-05 NOTE — Transfer of Care (Signed)
Immediate Anesthesia Transfer of Care Note  Patient: Thomas Zavala  Procedure(s) Performed: PAROTIDECTOMY (Left Neck) Sentinel Node Biopsy (Left Neck) EXCISION OF FOREHEAD  MELANOMA (N/A Head) SPLIT THICKNESS SKIN GRAFT Left shoulder (Left Shoulder)  Patient Location: PACU  Anesthesia Type:General  Level of Consciousness: awake, alert  and oriented  Airway & Oxygen Therapy: Patient Spontanous Breathing  Post-op Assessment: Report given to RN and Post -op Vital signs reviewed and stable  Post vital signs: Reviewed and stable  Last Vitals:  Vitals Value Taken Time  BP 131/63 01/05/20 1448  Temp    Pulse 75 01/05/20 1452  Resp 16 01/05/20 1452  SpO2 92 % 01/05/20 1452  Vitals shown include unvalidated device data.  Last Pain:  Vitals:   01/05/20 1040  TempSrc:   PainSc: 0-No pain      Patients Stated Pain Goal: 4 ([id number])  Complications: No apparent anesthesia complications

## 2020-01-05 NOTE — Anesthesia Preprocedure Evaluation (Addendum)
Anesthesia Evaluation  Patient identified by MRN, date of birth, ID band Patient awake    Reviewed: Allergy & Precautions, NPO status , Patient's Chart, lab work & pertinent test results, reviewed documented beta blocker date and time   History of Anesthesia Complications Negative for: history of anesthetic complications  Airway Mallampati: II  TM Distance: >3 FB Neck ROM: Full    Dental  (+) Dental Advisory Given, Caps   Pulmonary former smoker,  01/03/2020 SARS coronavirus NEG   breath sounds clear to auscultation       Cardiovascular hypertension, Pt. on medications and Pt. on home beta blockers (-) angina Rhythm:Regular Rate:Bradycardia     Neuro/Psych negative neurological ROS     GI/Hepatic negative GI ROS, Neg liver ROS,   Endo/Other  diabetes (glu 128), Oral Hypoglycemic Agents  Renal/GU negative Renal ROS     Musculoskeletal   Abdominal   Peds  Hematology CLL H/o thrombocytopenia   Anesthesia Other Findings   Reproductive/Obstetrics                            Anesthesia Physical Anesthesia Plan  ASA: III  Anesthesia Plan: General   Post-op Pain Management:    Induction: Intravenous  PONV Risk Score and Plan: 2 and Ondansetron and Dexamethasone  Airway Management Planned: Oral ETT and Video Laryngoscope Planned  Additional Equipment: None  Intra-op Plan:   Post-operative Plan: Extubation in OR  Informed Consent: I have reviewed the patients History and Physical, chart, labs and discussed the procedure including the risks, benefits and alternatives for the proposed anesthesia with the patient or authorized representative who has indicated his/her understanding and acceptance.     Dental advisory given  Plan Discussed with: CRNA and Surgeon  Anesthesia Plan Comments:        Anesthesia Quick Evaluation

## 2020-01-05 NOTE — Op Note (Signed)
OPERATIVE REPORT  DATE OF SURGERY: 01/05/2020  PATIENT:  Thomas Zavala,  86 y.o. male  PRE-OPERATIVE DIAGNOSIS:  parotid mass  POST-OPERATIVE DIAGNOSIS:  parotid mass  PROCEDURE:  Procedure(s): PAROTIDECTOMY EXCISION OF FOREHEAD  MELANOMA WITH SPLIT THICKNESS SKIN GRAFT  SURGEON:  Beckie Salts, MD  ASSISTANTS: Jolene Provost PA  ANESTHESIA:   General   EBL: 30 ml  DRAINS: 7 French round JP  LOCAL MEDICATIONS USED:  None  SPECIMEN: Left parotid  COUNTS:  Correct  PROCEDURE DETAILS: The patient was taken to the operating room and placed on the operating table in the supine position. Following induction of general endotracheal anesthesia, the left side of the face was prepped and draped in a standard fashion. A preauricular incision was outlined with a marking pen with extension down around the mastoid process and 2 fingerbreadths below the angle of the mandible.  Electrocautery was used to incise the skin and subcutaneous tissue. The lateral branch of the greater auricular nerve was not identified or intentionally preserved. The parotid gland was dissected forward off of the upper sternocleidomastoid muscle and the digastric muscle was exposed posteriorly. The gland was also dissected off of the external auditory canal. Careful dissection superior to the digastric muscle and medial to the tympanomastoid suture line revealed the main trunk of the facial nerve. Using a McCabe dissector the main trunk was dissected out towards the pes anserinus. The upper and lower divisions were then dissected identifying all branches.  The harmonic scalpel was used to divide the parotid tissue. Bipolar cautery was used as needed for completion of hemostasis. The nuclear signal was identified in the preauricular/superficial part of the gland. The glandular tissue with the accompanying tumor was dissected free and removed, sent for pathologic evaluation. The wound was irrigated with saline and  hemostasis was completed as needed. Nuclear activity was high ex vivo, and the surgical bed had minimal signal after the specimen was removed.  The drain was exited through inferior part of the incision and secured in place with nylon suture. A running subcuticular chromic closure was accomplished and Dermabond was used on the skin. The drain was charged. Patient was then handed over to Dr. Marla Roe who performed the scalp excision.    PATIENT DISPOSITION:  To PACU, stable

## 2020-01-05 NOTE — Anesthesia Postprocedure Evaluation (Signed)
Anesthesia Post Note  Patient: Thomas Zavala  Procedure(s) Performed: PAROTIDECTOMY (Left Neck) Sentinel Node Biopsy (Left Neck) EXCISION OF FOREHEAD  MELANOMA (N/A Head) SPLIT THICKNESS SKIN GRAFT Left shoulder (Left Shoulder)     Patient location during evaluation: PACU Anesthesia Type: General Level of consciousness: awake and alert, oriented and patient cooperative Pain management: pain level controlled Vital Signs Assessment: post-procedure vital signs reviewed and stable Respiratory status: spontaneous breathing, nonlabored ventilation, respiratory function stable and patient connected to nasal cannula oxygen Cardiovascular status: blood pressure returned to baseline and stable Postop Assessment: no apparent nausea or vomiting Anesthetic complications: no    Last Vitals:  Vitals:   01/05/20 1634 01/05/20 1654  BP:  (!) 151/86  Pulse: 76 79  Resp: 15 18  Temp:    SpO2: 99% 98%    Last Pain:  Vitals:   01/05/20 1040  TempSrc:   PainSc: 0-No pain                 Haru Anspaugh,E. Rashod Gougeon

## 2020-01-05 NOTE — Anesthesia Procedure Notes (Signed)
Procedure Name: Intubation Date/Time: 01/05/2020 12:28 PM Performed by: Eligha Bridegroom, CRNA Pre-anesthesia Checklist: Patient identified, Emergency Drugs available, Suction available, Patient being monitored and Timeout performed Patient Re-evaluated:Patient Re-evaluated prior to induction Oxygen Delivery Method: Circle system utilized Preoxygenation: Pre-oxygenation with 100% oxygen Induction Type: IV induction and Rapid sequence Laryngoscope Size: Glidescope Grade View: Grade II Tube type: Oral Tube size: 7.5 mm Number of attempts: 1 Airway Equipment and Method: Stylet Placement Confirmation: ETT inserted through vocal cords under direct vision,  positive ETCO2 and breath sounds checked- equal and bilateral Secured at: 22 cm Tube secured with: Tape Dental Injury: Teeth and Oropharynx as per pre-operative assessment

## 2020-01-05 NOTE — Progress Notes (Signed)
[name], male   DOB: [birth year], 86 y.o.   MRN: [id number] Postoperative check  He is sleepy but arousable.  The surgical site looks good.  The drain is charged and holding suction.  Dressing is in place in the forehead scalp.  Chronic ectropion of the left eye.  Eye is not closing completely.  There is generalized weakness of the left facial nerve but there is movement.  Continue overnight care with drain.  Recommend we tape the left eye closed and use lubricating drops for precaution.  Continue care.

## 2020-01-06 DIAGNOSIS — C07 Malignant neoplasm of parotid gland: Secondary | ICD-10-CM | POA: Diagnosis not present

## 2020-01-06 LAB — PATHOLOGIST SMEAR REVIEW

## 2020-01-06 MED ORDER — HYDROCODONE-ACETAMINOPHEN 7.5-325 MG PO TABS: 1.0000 | ORAL_TABLET | Freq: Four times a day (QID) | ORAL | 0 refills | Status: DC | PRN

## 2020-01-06 MED ORDER — PROMETHAZINE HCL 25 MG RE SUPP
25.0000 mg | Freq: Four times a day (QID) | RECTAL | 1 refills | Status: DC | PRN
Start: 2020-01-06 — End: 2020-06-15

## 2020-01-06 NOTE — Discharge Summary (Signed)
Physician Discharge Summary  [name] MRN: [id number] DOB/AGE: 1927/12/01 86 y.o.  Admit date: 01/05/2020 Discharge date: 01/06/2020  Admission Diagnoses: Scalp melanoma  Discharge Diagnoses:  Active Problems:   Melanoma of scalp [facility])   Discharged Condition: good  Hospital Course: No complications  Consults: none  Significant Diagnostic Studies: none  Treatments: surgery: Wide excision of scalp melanoma, sentinel node biopsy with superficial parotidectomy.  Discharge Exam: Blood pressure 123/69, pulse (!) 53, temperature 97.7 F (36.5 C), temperature source Oral, resp. rate 15, height 5\' 11"  (1.803 m), weight 83.9 kg, SpO2 96 %. PHYSICAL EXAM: Awake and alert, breathing and speaking clearly.  Left side of face is weak but not completely paralyzed.  Incision looks excellent.  JP drain removed.  Bolster in place on the forehead and donor site dressing is clean and dry.  Disposition:    Allergies as of 01/06/2020      Reactions   Fluorouracil Other (See Comments)   Burns over affected area (SJS)      Medication List    TAKE these medications   HYDROcodone-acetaminophen 7.5-325 MG tablet Commonly known as: Norco Take 1 tablet by mouth every 6 (six) hours as needed for moderate pain.   losartan-hydrochlorothiazide 100-25 MG tablet Commonly known as: HYZAAR Take 1 tablet by mouth daily.   LUBRICATING EYE DROPS OP Place 1 drop into both eyes daily as needed (dry eyes).   metFORMIN 500 MG 24 hr tablet Commonly known as: GLUCOPHAGE-XR Take 500 mg by mouth daily.   metoprolol tartrate 50 MG tablet Commonly known as: LOPRESSOR Take 1 tablet (50 mg total) by mouth 3 (three) times daily.   pravastatin 40 MG tablet Commonly known as: PRAVACHOL Take 40 mg by mouth daily.   promethazine 25 MG suppository Commonly known as: PHENERGAN Place 1 suppository (25 mg total) rectally every 6 (six) hours as needed for nausea or vomiting.      Follow-up  Information    Izora Gala, MD In 2 weeks.   Specialty: Otolaryngology Contact information: [address] [place] [address] [phone]        Wallace Going, DO In 1 week.   Specialty: Plastic Surgery Contact information: [place] [address] [phone]           Signed: Izora Gala 01/06/2020, 8:52 AM

## 2020-01-06 NOTE — Progress Notes (Signed)
Patient provided with discharge instructions, e-prescriptions, and educational handouts. Pt tolerated discharge session well. Pt verbalized understanding of discharge instructions. Pts daughter present for discharge teaching. Pt provided with dressing supplies for surgical sites. Pt transported off unit in wheelchair without incident.

## 2020-01-06 NOTE — Discharge Instructions (Signed)
1.  Keep a dressing on the lower part of the incision in front of the ear until there is no more drainage.  After that you may keep the incision open.  2.  It is okay to get the face incision wet.  Avoid any creams oils or ointments.  3.  It is okay to use Tylenol or Motrin for pain.  If you need something stronger I have sent prescriptions to your pharmacy.  You do not have to fill these.  4.  The left side of your face is going to be weak for a little while.  It may take days weeks or months to improve.  The most important thing is to make sure that your eyes stays closed at night when you are sleeping.  A piece of tape can be very helpful in doing that.

## 2020-01-06 NOTE — Progress Notes (Signed)
1 Day Post-Op  Subjective: Doing well this AM, daughter at bedside. Denies any pain other than his left eye.   No overnight events.   Objective: Vital signs in last 24 hours: Temp:  [97.1 F (36.2 C)-97.9 F (36.6 C)] 97.6 F (36.4 C) (05/20 1016) Pulse Rate:  [51-79] 66 (05/20 1016) Resp:  [8-18] 17 (05/20 1016) BP: (106-151)/(34-90) 141/72 (05/20 1016) SpO2:  [91 %-99 %] 99 % (05/20 1016)    Intake/Output from previous day: 05/19 0701 - 05/20 0700 In: 1290 [P.O.:390; I.V.:900] Out: 60 [Drains:50; Blood:10] Intake/Output this shift: Total I/O In: 120 [P.O.:120] Out: -   General appearance: alert, cooperative, no distress. Dressed and sitting up in chair Eye: Left eye ectropion. Head: Bandage and bolster over left forehead/scalp, some drainage noted on forehead. Mepilex border dressing in place over scalp. Left cheek incision in tact, dermabond in place. Neck: Jp drain insertion site covered with gauze Chest wall: mepilex border dressing over superior anterior chest wall/shoulder. No drainage noted on bandage. No erythema. No TTP.   Lab Results:  CBC Latest Ref Rng & Units 01/05/2020 10/19/2019 07/21/2019  WBC 4.0 - 10.5 K/uL 80.9(HH) 76.1(HH) 64.9(HH)  Hemoglobin 13.0 - 17.0 g/dL 16.2 15.7 15.0  Hematocrit 39.0 - 52.0 % 48.2 45.8 45.0  Platelets 150 - 400 K/uL 100(L) 80(L) 83(L)    BMET Recent Labs    01/05/20 1000  NA 127*  K 5.2*  CL 89*  CO2 24  GLUCOSE 146*  BUN 9  CREATININE 1.06  CALCIUM 9.1   PT/INR No results for input(s): LABPROT, INR in the last 72 hours. ABG No results for input(s): PHART, HCO3 in the last 72 hours.  Invalid input(s): PCO2, PO2  Studies/Results: NM SENTINEL NODE INJ-NO RPT (MELANOMA)  Result Date: 01/05/2020 Sulfur colloid was injected by the nuclear medicine technologist for melanoma sentinel node.    Anti-infectives: Anti-infectives (From admission, onward)   Start     Dose/Rate Route Frequency Ordered Stop   01/05/20  1338  polymyxin B 500,000 Units, bacitracin 50,000 Units in sodium chloride 0.9 % 500 mL irrigation  Status:  Discontinued       As needed 01/05/20 1338 01/05/20 1444   01/05/20 0945  ceFAZolin (ANCEF) IVPB 2g/100 mL premix     2 g 200 mL/hr over 30 Minutes Intravenous On call to O.R. 01/05/20 0938 01/05/20 1245   01/05/20 0944  ceFAZolin (ANCEF) 2-4 GM/100ML-% IVPB    Note to Pharmacy: Tressia Miners Ch: cabinet override      01/05/20 0944 01/05/20 2159      Assessment/Plan: s/p Procedure(s): PAROTIDECTOMY EXCISION OF FOREHEAD  MELANOMA SPLIT THICKNESS SKIN GRAFT Left shoulder Sentinel Node Biopsy  Patient being discharged with daughter, he is feeling well.   Follow up with [place] in 1 week. Call with questions or concerns. Discussed post-op precautions and plan.   LOS: 0 days    Charlies Constable, PA-C 01/06/2020

## 2020-01-07 ENCOUNTER — Other Ambulatory Visit: Payer: Self-pay

## 2020-01-07 DIAGNOSIS — E11628 Type 2 diabetes mellitus with other skin complications: Secondary | ICD-10-CM | POA: Diagnosis not present

## 2020-01-07 DIAGNOSIS — E782 Mixed hyperlipidemia: Secondary | ICD-10-CM | POA: Diagnosis not present

## 2020-01-07 DIAGNOSIS — I10 Essential (primary) hypertension: Secondary | ICD-10-CM | POA: Diagnosis not present

## 2020-01-11 LAB — SURGICAL PATHOLOGY

## 2020-01-12 NOTE — Progress Notes (Deleted)
   Subjective:     [name], male    DOB: 03-12-1928, 86 y.o.   MRN: [id number]  Chief Complaint  Patient presents with  . Post-op Follow-up    for excision of forehead melanoma with skin graft    HPI: The patient is a 86 y.o. male here for follow-up After excision of forehead melanoma and split-thickness skin graft from left shoulder to forehead on 01/05/2020 with Dr. Marla Roe.  Patient also underwent parotidectomy and sentinel node biopsy on the left by Dr. Constance Holster at that time.  Surgical pathology from forehead melanoma showed residual melanoma in situ, no residual invasive disease, margins free.  Deep margin of scalp negative for melanoma.  Pathology from parotid gland showed normal parotid gland, but 1 out of 4 lymph nodes positive for microscopic focus of melanoma without any extracapsular extension.  Review of Systems   Objective:   Vital Signs There were no vitals taken for this visit. Vital Signs and Nursing Note Reviewed Chaperone present*** Physical Exam     Assessment/Plan:  No diagnosis found.  ***          Thomas Rhine [name], PA-C 01/13/2020, 8:02 AM

## 2020-01-13 ENCOUNTER — Encounter: Payer: PPO | Admitting: Plastic Surgery

## 2020-01-13 ENCOUNTER — Encounter: Payer: Self-pay | Admitting: Surgical

## 2020-01-13 ENCOUNTER — Ambulatory Visit (INDEPENDENT_AMBULATORY_CARE_PROVIDER_SITE_OTHER): Payer: PPO | Admitting: Plastic Surgery

## 2020-01-13 ENCOUNTER — Other Ambulatory Visit: Payer: Self-pay

## 2020-01-13 DIAGNOSIS — C439 Malignant melanoma of skin, unspecified: Secondary | ICD-10-CM

## 2020-01-13 NOTE — Progress Notes (Signed)
   Subjective:    [name], male    DOB: [birth year], 86 y.o.   MRN: [id number]  The patient is an 86 year old male here with his daughter for follow-up on his forehead melanoma.  Last week he underwent excision with a skin graft placement.  He also had a left neck dissection.  The pathology showed 1 of 4 lymph nodes positive.  Margins are negative and the deep margin was negative.  All areas are healing nicely.  There is no sign of infection.  The bolster was removed from the forehead. So far good take on the graft consicering one week post op.  Review of Systems  Constitutional: Negative.   HENT: Negative.   Eyes: Negative.   Respiratory: Negative.   Cardiovascular: Negative.   Genitourinary: Negative.        Objective:   Physical Exam Vitals and nursing note reviewed.  Constitutional:      Appearance: Normal appearance.  HENT:     Head: Normocephalic.   Cardiovascular:     Rate and Rhythm: Normal rate.     Pulses: Normal pulses.  Pulmonary:     Effort: Pulmonary effort is normal.  Neurological:     Mental Status: He is alert. Mental status is at baseline.  Psychiatric:        Mood and Affect: Mood normal.        Assessment & Plan:     ICD-10-CM   1. Melanoma of skin (HCC)  C43.9     Forehead:  Apply xeroform or vaseline gauze to the wound every other day as needed.  Change gauze daily.  Don't get wet yet.   Can get the donor site wet on the clavicle.  Follow up in one week.

## 2020-01-14 ENCOUNTER — Telehealth: Payer: Self-pay | Admitting: Hematology

## 2020-01-14 ENCOUNTER — Telehealth: Payer: Self-pay

## 2020-01-14 DIAGNOSIS — C439 Malignant melanoma of skin, unspecified: Secondary | ICD-10-CM | POA: Diagnosis not present

## 2020-01-14 DIAGNOSIS — L570 Actinic keratosis: Secondary | ICD-10-CM | POA: Diagnosis not present

## 2020-01-14 DIAGNOSIS — C4442 Squamous cell carcinoma of skin of scalp and neck: Secondary | ICD-10-CM | POA: Diagnosis not present

## 2020-01-14 DIAGNOSIS — C44622 Squamous cell carcinoma of skin of right upper limb, including shoulder: Secondary | ICD-10-CM | POA: Diagnosis not present

## 2020-01-14 DIAGNOSIS — X32XXXD Exposure to sunlight, subsequent encounter: Secondary | ICD-10-CM | POA: Diagnosis not present

## 2020-01-14 NOTE — Telephone Encounter (Signed)
Faxed [facility] order for Xeroform

## 2020-01-14 NOTE — Telephone Encounter (Signed)
Scheduled appt per 5/28 sch message - pt aware of appt date and time

## 2020-01-19 NOTE — Progress Notes (Signed)
HEMATOLOGY/ONCOLOGY CLINIC  Date of Service: 01/20/2020  Patient Care Team: Corrington, Delsa Grana, MD as PCP - General (Family Medicine)  CHIEF COMPLAINTS/PURPOSE OF CONSULTATION:  Chronic Lymphocytic Leukemia Recently diagnosed Melanoma  HISTORY OF PRESENTING ILLNESS:   Thomas Zavala is a wonderful 86 y.o. male who has been referred to [place] by Bradly Bienenstock, NP for evaluation and management of his Chronic Lymphocytic Leukemia. The patient has been seen by Dr. Murriel Hopper in the past. He is accompanied today by his daughter. The pt reports that he is doing well overall.   The pt reports that he had ease of bruising in 2017 at which time he sought out a physical and was observed to have lymphocytosis and was diagnosed with CLL in November 2017. The pt denies having any other symptoms at that time. The pt has not had treatment for his CLL.  The pt notes that he has had some sweating at night between his legs, and feelings of flushness, which he notes started "in the last week or so." The pt denies fevers, chills, and noticing any new lumps or bumps. He has had recurrent squamous cell carcinomas for "several years," in sun-exposed areas. He follows with his dermatologist regularly for skin screenings.  The pt notes that his energy levels have diminished somewhat over the last 3 years, but denies fatigue being a limiting factor. He recently went on a 2 mile hike with his daughter. He has a one level living arrangement. The pt denies frequent or recurrent infections.   The pt notes that he follows with Dr. Gloriann Loan in urology for occasional hematuria. He notes that his DM and blood pressure have been well controlled.   The pt notes that he has had both of his pneumonia vaccines in the last 5 years and received his flu shot this season.  Most recent lab results (10/21/18) of CBC w/diff is as follows: all values are WNL except for WBC at WBC at 51.5k, PLT at 73k, Lymphs abs at 46.5k, Monocytes abs at  1.9k. 10/21/18 CMP is pending 10/21/18 LDH is pending  On review of systems, pt reports staying active, eating well, mildly lower energy levels, and denies drenching night sweats, fevers, new fatigue, chills, unexpected weight loss, frequent infections, recurrent infections, leg swelling, abdominal pains, and any other symptoms.  On PMHx the pt reports CLL, DM type II.   INTERVAL HISTORY:   Thomas Zavala returns today for management and evaluation of his CLL. The patient's last visit with [place] was on 12/06/19. The pt reports that he is doing well overall.  The pt reports he is doing well. Pt's surgery went well. It still bothers him laying on the side of his face, but it is due to him still healing. He has not had any night sweats over the last couple of weeks. Currently pt is scheduled for a trip to [place] for 1 week in mid-October.   Of note since the patient's last visit, pt has had NM Lymph/Gland ([id number]) completed on 12/31/19 with results revealing "Lymph node mapping to a LEFT periauricular node." Pt has had surgical pathology ([id number]) completed on 01/05/20 with results revealing "A. PAROTID GLAND, LEFT, PAROTIDECTOMY: 1 OUT OF 4 LYMPH NODES, POSITIVE  FOR MICROSCOPIC FOCUS OF MELANOMA (<0.1X 0.1 X 0.1 MM), NO EXTRACAPSULAR  EXTENSION; NORMAL PAROTID GLAND  COMMENT: A total of 4 intraparotid lymph nodes are examined with H and  E, MelanA, Sox-10, and HMB-45. A microscopic focus of melanoma  resembling the  prior tumor is present in block A2, measuring <0.1 mm in aggregate. Dr. Dione Plover is in agreement. No ECE is present. B. SKIN, SCALP, EXCISION: RESIDUAL MELANOMA IN SITU, NO RESIDUAL INVASIVE DISEASE, STAGE FROM PRIOR CASE [id number] AS PT3A MELANOMA BUT NOW N1, MARGINS FREE MELANOMA TABLE: REPEATED FROM CASE [id number] WITH UPDATED STAGING INFORMATION PROCEDURE: EXCISION  SPECIMEN ANATOMIC SITE: SCALP BRESLOW'S DEPTH/MAXIMUM TUMOR THICKNESS: 2.7 MM CLARK/ANATOMIC LEVEL: IV    PERIPHERAL MARGINS: FREE DEEP MARGIN: FREE ULCERATION: ABSENT SATELLITOSIS: ABSENT MITOTIC INDEX: 7/MM2 LYMPHO-VASCULAR INVASION: ABSENT NEUROTROPISM: ABSENT TUMOR-INFILTRATING LYMPHOCYTES: PRESENT, NON-BRISK TUMOR REGRESSION: ABSENT LYMPH NODES: 1 OUT OF 4 LYMPH NODES, POSITIVE FOR MELANOMA  PATHOLOGIC STAGE: PT3A N1 MX COMMENT: MelanA was used to highlight selected sections. Dr. Dione Plover is in agreement with residual melanoma in situ in the excisional specimen and margins are free. C. SKIN, SCALP, DEEP MARGIN, EXCISION: BENIGN FIBROADIPOSE TISSUE, NEGATIVE FOR MELANOMA"  Lab results today (01/05/20) of CBC w/diff and CMP is as follows: all values are WNL except for WBC at 80.9K, MCV at 101.5, MCH at 34.1, Platelets at 100, Sodium at 127, Potassium at 5.2, Chloride at 89, Glucose at 146 01/05/20 of Glucose Capillary at 143  On review of systems, pt denies night sweats, headaches, and any other symptoms.   MEDICAL HISTORY:  Past Medical History:  Diagnosis Date  . Abdominal hernia   . BPH (benign prostatic hyperplasia) 07/25/2016  . CLL (chronic lymphocytic leukemia) ([place])   . Hypertension   . Melanoma of forehead ([place])   . Melanoma of scalp ([place])   . PVC's (premature ventricular contractions)   . Type 2 diabetes mellitus without complication, without long-term current use of insulin ([place]) 07/25/2016    SURGICAL HISTORY: Past Surgical History:  Procedure Laterality Date  . EYE SURGERY     eye lids  . IRRIGATION AND DEBRIDEMENT OF WOUND WITH SPLIT THICKNESS SKIN GRAFT Left 01/05/2020   Procedure: SPLIT THICKNESS SKIN GRAFT Left shoulder;  Surgeon: Wallace Going, DO;  Location: [facility];  Service: Plastics;  Laterality: Left;  [name] MASS EXCISION N/A 01/05/2020   Procedure: EXCISION OF FOREHEAD  MELANOMA;  Surgeon: Wallace Going, DO;  Location: [facility];  Service: Plastics;  Laterality: N/A;  . PAROTIDECTOMY Left 01/05/2020   Procedure: PAROTIDECTOMY;  Surgeon: Izora Gala, MD;   Location: [facility];  Service: ENT;  Laterality: Left;  . PROSTATE SURGERY    . SENTINEL NODE BIOPSY Left 01/05/2020   Procedure: Sentinel Node Biopsy;  Surgeon: Izora Gala, MD;  Location: [facility];  Service: ENT;  Laterality: Left;    SOCIAL HISTORY: Social History   Socioeconomic History  . Marital status: Widowed    Spouse name: Not on file  . Number of children: 3  . Years of education: Not on file  . Highest education level: Not on file  Occupational History  . Not on file  Tobacco Use  . Smoking status: Former Smoker    Years: 25.00    Types: Cigarettes  . Smokeless tobacco: Never Used  . Tobacco comment: quit in the 70's  Substance and Sexual Activity  . Alcohol use: Yes    Comment: socially.  . Drug use: No  . Sexual activity: Not on file  Other Topics Concern  . Not on file  Social History Narrative  . Not on file   Social Determinants of Health   Financial Resource Strain:   . Difficulty of Paying Living Expenses:   Food Insecurity:   .  Worried About Charity fundraiser in the Last Year:   . Arboriculturist in the Last Year:   Transportation Needs:   . Film/video editor (Medical):   [name] Lack of Transportation (Non-Medical):   Physical Activity:   . Days of Exercise per Week:   . Minutes of Exercise per Session:   Stress:   . Feeling of Stress :   Social Connections:   . Frequency of Communication with Friends and Family:   . Frequency of Social Gatherings with Friends and Family:   . Attends Religious Services:   . Active Member of Clubs or Organizations:   . Attends Archivist Meetings:   [name] Marital Status:   Intimate Partner Violence:   . Fear of Current or Ex-Partner:   . Emotionally Abused:   [name] Physically Abused:   . Sexually Abused:     FAMILY HISTORY: Family History  Problem Relation Age of Onset  . Hypertension Father     ALLERGIES:  is allergic to fluorouracil.  MEDICATIONS:  Current Outpatient Medications  Medication Sig  Dispense Refill  . Carboxymethylcellul-Glycerin (LUBRICATING EYE DROPS OP) Place 1 drop into both eyes daily as needed (dry eyes).    [name] HYDROcodone-acetaminophen (NORCO) 7.5-325 MG tablet Take 1 tablet by mouth every 6 (six) hours as needed for moderate pain. 20 tablet 0  . losartan-hydrochlorothiazide (HYZAAR) 100-25 MG tablet Take 1 tablet by mouth daily.     . metFORMIN (GLUCOPHAGE-XR) 500 MG 24 hr tablet Take 500 mg by mouth daily.     . metoprolol tartrate (LOPRESSOR) 50 MG tablet Take 1 tablet (50 mg total) by mouth 3 (three) times daily. 270 tablet 1  . pravastatin (PRAVACHOL) 40 MG tablet Take 40 mg by mouth daily.     . promethazine (PHENERGAN) 25 MG suppository Place 1 suppository (25 mg total) rectally every 6 (six) hours as needed for nausea or vomiting. 12 suppository 1   No current facility-administered medications for this visit.    REVIEW OF SYSTEMS:   A 10+ POINT REVIEW OF SYSTEMS WAS OBTAINED including neurology, dermatology, psychiatry, cardiac, respiratory, lymph, extremities, GI, GU, Musculoskeletal, constitutional, breasts, reproductive, HEENT.  All pertinent positives are noted in the HPI.  All others are negative.   PHYSICAL EXAMINATION: ECOG PERFORMANCE STATUS: 2 - Symptomatic, <50% confined to bed  . Vitals:   01/20/20 1252  BP: 126/68  Pulse: 80  Resp: 18  Temp: 98.1 F (36.7 C)  SpO2: 98%   Filed Weights   01/20/20 1252  Weight: 185 lb 8 oz (84.1 kg)   Body mass index is 25.87 kg/m.  Exam given in chair   GENERAL:alert, in no acute distress and comfortable SKIN: no acute rashes, no significant lesions EYES: conjunctiva are pink and non-injected, sclera anicteric OROPHARYNX: MMM, no exudates, no oropharyngeal erythema or ulceration NECK: supple, no JVD LYMPH:  no palpable lymphadenopathy in the cervical, axillary or inguinal regions LUNGS: clear to auscultation b/l with normal respiratory effort HEART: regular rate & rhythm ABDOMEN:  normoactive  bowel sounds , non tender, not distended. Extremity: no pedal edema PSYCH: alert & oriented x 3 with fluent speech NEURO: no focal motor/sensory deficits  LABORATORY DATA:  I have reviewed the data as listed  . CBC Latest Ref Rng & Units 01/05/2020 10/19/2019 07/21/2019  WBC 4.0 - 10.5 K/uL 80.9(HH) 76.1(HH) 64.9(HH)  Hemoglobin 13.0 - 17.0 g/dL 16.2 15.7 15.0  Hematocrit 39.0 - 52.0 % 48.2 45.8 45.0  Platelets 150 -  400 K/uL 100(L) 80(L) 83(L)    . CMP Latest Ref Rng & Units 01/05/2020 10/19/2019 07/21/2019  Glucose 70 - 99 mg/dL 146(H) 152(H) 153(H)  BUN 8 - 23 mg/dL 9 10 11   Creatinine 0.61 - 1.24 mg/dL 1.06 1.11 1.11  Sodium 135 - 145 mmol/L 127(L) 134(L) 135  Potassium 3.5 - 5.1 mmol/L 5.2(H) 3.6 3.3(L)  Chloride 98 - 111 mmol/L 89(L) 94(L) 92(L)  CO2 22 - 32 mmol/L 24 32 30  Calcium 8.9 - 10.3 mg/dL 9.1 8.8(L) 9.1  Total Protein 6.5 - 8.1 g/dL - 6.5 6.4(L)  Total Bilirubin 0.3 - 1.2 mg/dL - 1.2 1.1  Alkaline Phos 38 - 126 U/L - 99 97  AST 15 - 41 U/L - 22 19  ALT 0 - 44 U/L - 14 14    07/15/16 Cytogenetics and Flow Cytometry:   01/05/20 of Surgical Pathology Report [id number])  RADIOGRAPHIC STUDIES: I have personally reviewed the radiological images as listed and agreed with the findings in the report. NM Lymph/Gland  Result Date: 12/31/2019 CLINICAL DATA:  Scalp melanoma.  LEFT forehead EXAM: NUCLEAR MEDICINE LYMPHANGIOGRAPHY TECHNIQUE: Sequential images were obtained following intradermal injection of radiopharmaceutical at the tumor site in the LEFT forehead. RADIOPHARMACEUTICALS:  Insert 0.5 mCi millipore-filtered Tc-31m sulfur colloid COMPARISON:  None FINDINGS: Intense activity at the just injection site in the LEFT forehead. Immediate imaging demonstrates a lymph channel extending along the LEFT lateral scalp and collecting within a LEFT periauricular lymph node. a No supraclavicular nodes.  No axillary nodes. IMPRESSION: Lymph node mapping to a LEFT periauricular  node. Electronically Signed   By: Suzy Bouchard M.D.   On: 12/31/2019 13:26   NM SENTINEL NODE INJ-NO RPT (MELANOMA)  Result Date: 01/05/2020 Sulfur colloid was injected by the nuclear medicine technologist for melanoma sentinel node.     ASSESSMENT & PLAN:  86 y.o. male with  1. Chronic Lymphocytic Leukemia Diagnosed in November 2017 with flow cytometry 07/15/16 Cytogenetics revealed Trisomy 12  09/03/16 CT A/P revealed Borderline splenomegaly with 1.3 cm low-attenuation lesion in the anterior aspect of spleen. Mild upper abdominal and right iliac lymphadenopathy. 4 mm indeterminate pulmonary nodule in right lung base. Recommend continued attention on follow-up CT. Incidentally noted benign right hepatic lobe hemangioma, mildly and enlarged prostate, and colonic diverticulosis  10/10/17 CT Chest revealed Scattered small bilateral pulmonary nodules, including two dominant right middle lobe nodules measuring 5-6 mm, unchanged, likely benign. If clinically warranted, consider a single follow-up CT chest in 1 year. Mild mediastinal and bilateral hilar lymphadenopathy, likely related to the patient's known CLL. Mild splenomegaly, incompletely visualized. Aortic Atherosclerosis and Emphysema  2. Newly diagnosed forehead cutaneous melanoma (pT3a, pN1  Mx) 2.89mm depth (atleast Stage IIIB)  PLAN: -Discussed pt labwork today, 01/05/20; of CBC w/diff and CMP is as follows: all values are WNL except for WBC at 80.9K, MCV at 101.5, MCH at 34.1, Platelets at 100, Sodium at 127, Potassium at 5.2, Chloride at 89, Glucose at 146 -Discussed 01/05/20 of Glucose Capillary at 143 -Advised pt of symptoms that would require imaging including: headaches, new skin lesions, new lumps/bumps -Advised pt again that his CLL will increase his risk of non-Melanoma skin cancers -No lab or clinical evidence of symptomatic CLL recurrence/progression at this time -Not unreasonable to continue watching CLL with labs and  clinic visits for now -Recommend pt continue to f/u with Dermatologist for routine screening -Advised on National Guidelines- Stage IIIB -Advised PET CT whole body scan -can have enlarged lymph nodes  from CLL, may need additional biopsies  -Advised on additional molecular testing on tumor  -Advised treatment- adjuvant immunotherapy- risk coming back elsewhere -Advised on other immunotherapy options -Advised on palliative immunotherapy  -Advised on melanoma with CLL -Advised on MRI of Brain  -Recommends MRI, PET CT, molecular testing  -MRI Brain in 1 weeks -PET/CT in 2 weeks -MD visit in 3 weeks    FOLLOW UP: MRI Brain in 1 weeks PET/CT in 2 weeks MD visit in 3 weeks  The total time spent in the appt was 40 minutes and more than 50% was on counseling and direct patient cares.  All of the patient's questions were answered with apparent satisfaction. The patient knows to call the clinic with any problems, questions or concerns.  Sullivan Lone MD [place] AAHIVMS [facility] Hematology/Oncology Physician [facility]  (Office):       [phone] (Work cell):  [phone] (Fax):           [phone]  01/20/2020 2:01 PM  I, Dawayne Cirri am acting as a Education administrator for Dr. Sullivan Lone.   .I have reviewed the above documentation for accuracy and completeness, and I agree with the above. Brunetta Genera MD

## 2020-01-20 ENCOUNTER — Encounter: Payer: Self-pay | Admitting: Hematology

## 2020-01-20 ENCOUNTER — Inpatient Hospital Stay: Payer: PPO | Attending: Hematology | Admitting: Hematology

## 2020-01-20 ENCOUNTER — Other Ambulatory Visit: Payer: Self-pay

## 2020-01-20 VITALS — BP 126/68 | HR 80 | Temp 98.1°F | Resp 18 | Ht 71.0 in | Wt 185.5 lb

## 2020-01-20 DIAGNOSIS — C9110 Chronic lymphocytic leukemia of B-cell type not having achieved remission: Secondary | ICD-10-CM | POA: Diagnosis not present

## 2020-01-20 DIAGNOSIS — C4330 Malignant melanoma of unspecified part of face: Secondary | ICD-10-CM

## 2020-01-20 DIAGNOSIS — Z87891 Personal history of nicotine dependence: Secondary | ICD-10-CM | POA: Insufficient documentation

## 2020-01-20 DIAGNOSIS — C434 Malignant melanoma of scalp and neck: Secondary | ICD-10-CM | POA: Diagnosis not present

## 2020-01-20 DIAGNOSIS — R319 Hematuria, unspecified: Secondary | ICD-10-CM | POA: Insufficient documentation

## 2020-01-20 DIAGNOSIS — E119 Type 2 diabetes mellitus without complications: Secondary | ICD-10-CM | POA: Insufficient documentation

## 2020-01-21 ENCOUNTER — Telehealth: Payer: Self-pay | Admitting: Hematology

## 2020-01-21 NOTE — Telephone Encounter (Signed)
Scheduled per 06/02 los, patient has been called and voicemail was left.

## 2020-01-25 ENCOUNTER — Ambulatory Visit (INDEPENDENT_AMBULATORY_CARE_PROVIDER_SITE_OTHER): Payer: PPO | Admitting: Surgical

## 2020-01-25 ENCOUNTER — Other Ambulatory Visit: Payer: Self-pay

## 2020-01-25 ENCOUNTER — Encounter: Payer: PPO | Admitting: Surgical

## 2020-01-25 ENCOUNTER — Encounter: Payer: Self-pay | Admitting: Surgical

## 2020-01-25 VITALS — BP 163/89 | HR 65 | Temp 97.5°F | Ht 71.0 in | Wt 184.0 lb

## 2020-01-25 DIAGNOSIS — C439 Malignant melanoma of skin, unspecified: Secondary | ICD-10-CM

## 2020-01-25 NOTE — Progress Notes (Signed)
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  He underwent excision with placement of a skin graft by Dr. Marla Roe.  He had also underwent left neck dissection by Dr. Constance Holster.  He is here with his daughter today.  Patient is doing well, is not having any pain.  They have been applying Xeroform gauze approximately every 3 days to forehead skin graft.  On exam skin graft is doing well, epithelium has sloughed off but the dermal layer has taken nicely.  There is some scabbing superiorly that is still present.  No areas of necrosis or skin loss noted.  No foul odor.  No drainage noted.  Donor site on left shoulder/anterior chest healing nicely.  No wounds noted.  No erythema noted.  Left neck dissection incision healing well, some scabbing noted.  Recommend Xeroform dressing changes every other day for the next week, then apply Vaseline daily for 1 week.  Patient can shower, avoid water directly hitting the graft.  Allow water to run over the graft.  Do not scrub.  Patient and daughter expressed understanding.  Avoid bumping the graft on any objects as skin is very new and prone to tears.  Follow-up in 2 weeks for reevaluation.  Pictures were obtained of the patient and placed in the chart with the patient's or guardian's permission.

## 2020-01-27 ENCOUNTER — Telehealth: Payer: Self-pay | Admitting: *Deleted

## 2020-01-27 NOTE — Telephone Encounter (Signed)
Contacted pathology at Dr. Grier Mitts request: "Have pathology add on BRAF mutation testing on Mr Paola latest surgical specimen showing melanoma" Request given to [facility] - per Suanne Marker, this is a [facility] case and she will send message/request to [facility].

## 2020-01-28 DIAGNOSIS — Z85820 Personal history of malignant melanoma of skin: Secondary | ICD-10-CM | POA: Diagnosis not present

## 2020-01-28 DIAGNOSIS — L821 Other seborrheic keratosis: Secondary | ICD-10-CM | POA: Diagnosis not present

## 2020-01-28 DIAGNOSIS — Z1283 Encounter for screening for malignant neoplasm of skin: Secondary | ICD-10-CM | POA: Diagnosis not present

## 2020-01-28 DIAGNOSIS — Z08 Encounter for follow-up examination after completed treatment for malignant neoplasm: Secondary | ICD-10-CM | POA: Diagnosis not present

## 2020-01-28 DIAGNOSIS — L570 Actinic keratosis: Secondary | ICD-10-CM | POA: Diagnosis not present

## 2020-01-28 DIAGNOSIS — X32XXXD Exposure to sunlight, subsequent encounter: Secondary | ICD-10-CM | POA: Diagnosis not present

## 2020-01-31 ENCOUNTER — Telehealth: Payer: Self-pay | Admitting: Hematology

## 2020-01-31 NOTE — Telephone Encounter (Signed)
Scheduled appt per 6/14 sch message - unable to reach pt .left message with appt date and time ° °

## 2020-02-01 DIAGNOSIS — C4339 Malignant melanoma of other parts of face: Secondary | ICD-10-CM | POA: Diagnosis not present

## 2020-02-04 DIAGNOSIS — Z8589 Personal history of malignant neoplasm of other organs and systems: Secondary | ICD-10-CM | POA: Diagnosis not present

## 2020-02-04 DIAGNOSIS — H16213 Exposure keratoconjunctivitis, bilateral: Secondary | ICD-10-CM | POA: Diagnosis not present

## 2020-02-04 DIAGNOSIS — H02135 Senile ectropion of left lower eyelid: Secondary | ICD-10-CM | POA: Diagnosis not present

## 2020-02-04 DIAGNOSIS — H04123 Dry eye syndrome of bilateral lacrimal glands: Secondary | ICD-10-CM | POA: Diagnosis not present

## 2020-02-04 DIAGNOSIS — H02115 Cicatricial ectropion of left lower eyelid: Secondary | ICD-10-CM | POA: Diagnosis not present

## 2020-02-04 DIAGNOSIS — H04523 Eversion of bilateral lacrimal punctum: Secondary | ICD-10-CM | POA: Diagnosis not present

## 2020-02-04 DIAGNOSIS — H02535 Eyelid retraction left lower eyelid: Secondary | ICD-10-CM | POA: Diagnosis not present

## 2020-02-04 DIAGNOSIS — H02532 Eyelid retraction right lower eyelid: Secondary | ICD-10-CM | POA: Diagnosis not present

## 2020-02-04 DIAGNOSIS — H02145 Spastic ectropion of left lower eyelid: Secondary | ICD-10-CM | POA: Diagnosis not present

## 2020-02-07 DIAGNOSIS — C4442 Squamous cell carcinoma of skin of scalp and neck: Secondary | ICD-10-CM | POA: Diagnosis not present

## 2020-02-08 ENCOUNTER — Encounter (HOSPITAL_COMMUNITY)
Admission: RE | Admit: 2020-02-08 | Discharge: 2020-02-08 | Disposition: A | Discharge status: Home / Self Care | Payer: PPO | Source: Ambulatory Visit | Attending: Otolaryngology | Admitting: Otolaryngology

## 2020-02-08 ENCOUNTER — Encounter (HOSPITAL_COMMUNITY): Payer: Self-pay | Admitting: Hematology

## 2020-02-08 ENCOUNTER — Other Ambulatory Visit: Payer: Self-pay

## 2020-02-08 ENCOUNTER — Encounter (HOSPITAL_COMMUNITY)
Admission: RE | Admit: 2020-02-08 | Discharge: 2020-02-08 | Disposition: A | Discharge status: Home / Self Care | Payer: PPO | Source: Ambulatory Visit | Attending: Hematology | Admitting: Hematology

## 2020-02-08 DIAGNOSIS — R161 Splenomegaly, not elsewhere classified: Secondary | ICD-10-CM | POA: Insufficient documentation

## 2020-02-08 DIAGNOSIS — C434 Malignant melanoma of scalp and neck: Secondary | ICD-10-CM | POA: Insufficient documentation

## 2020-02-08 DIAGNOSIS — J439 Emphysema, unspecified: Secondary | ICD-10-CM | POA: Insufficient documentation

## 2020-02-08 DIAGNOSIS — C4330 Malignant melanoma of unspecified part of face: Secondary | ICD-10-CM | POA: Insufficient documentation

## 2020-02-08 DIAGNOSIS — I700 Atherosclerosis of aorta: Secondary | ICD-10-CM | POA: Diagnosis not present

## 2020-02-08 DIAGNOSIS — I2510 Atherosclerotic heart disease of native coronary artery without angina pectoris: Secondary | ICD-10-CM | POA: Insufficient documentation

## 2020-02-08 DIAGNOSIS — C439 Malignant melanoma of skin, unspecified: Secondary | ICD-10-CM | POA: Diagnosis not present

## 2020-02-08 DIAGNOSIS — C4339 Malignant melanoma of other parts of face: Secondary | ICD-10-CM | POA: Diagnosis not present

## 2020-02-08 LAB — GLUCOSE, CAPILLARY: Glucose-Capillary: 138 mg/dL — ABNORMAL HIGH (ref 70–99)

## 2020-02-08 IMAGING — PT NM PET TUM IMG INITIAL (PI) WHOLE BODY
1 of 9 series · 3 of 25 positions shown · non-contrast
Comparison: [DATE] chest CT.  [DATE] abdominopelvic CT.

CLINICAL DATA: Initial treatment strategy for cutaneous melanoma of
the forehead. Second COVID vaccine in [REDACTED]. History of
lymphocytic leukemia.

EXAM:
NUCLEAR MEDICINE PET WHOLE BODY
TECHNIQUE: 9.8 mCi F-18 FDG was injected intravenously. Full-ring PET imaging
was performed from the skull base to thigh after the radiotracer. CT
data was obtained and used for attenuation correction and anatomic
localization.
Fasting blood glucose: 138 mg/dl

[Series 4: ct wb 5.0 hd_fov · axial · 5.0mm · 1.07mm/px · z∈[+44,+996]mm · 3 of 459 slices shown]
[im 115/459  soft-tissue]
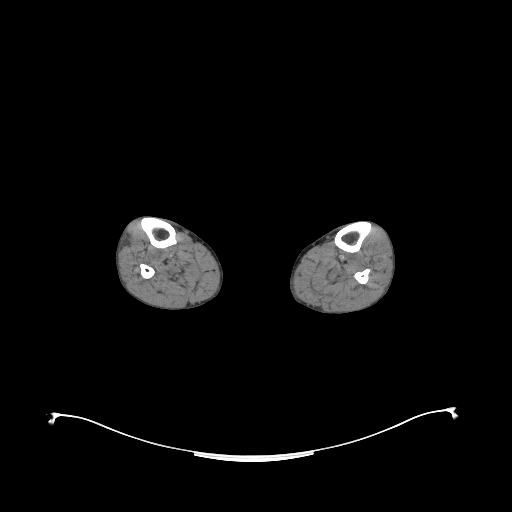
[im 230/459  soft-tissue]
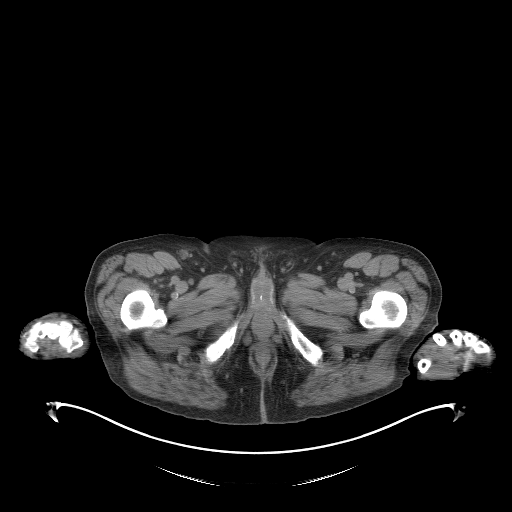
[im 344/459  soft-tissue]
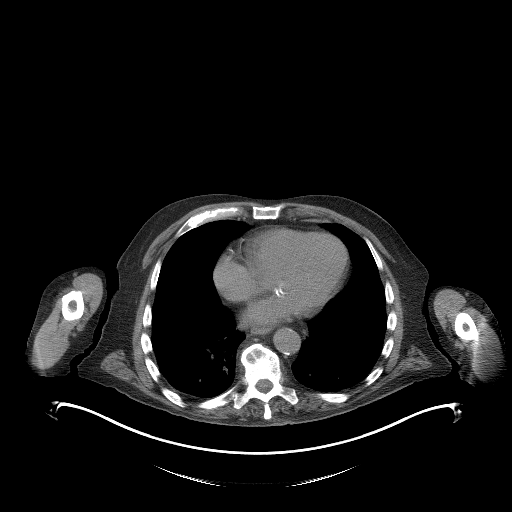

[3 of 25 positions shown; findings below may reference images not displayed]

FINDINGS: Mediastinal blood pool activity: SUV max

HEAD/NECK: Hypermetabolism about the medial right eyelid is without
CT correlate, including at a S.U.V. max of 7.5 on [DATE]. No
significant hypermetabolism about the forehead, including a site of
subtle asymmetric right-sided scalp soft tissue thickening on [DATE].
No cervical nodal hypermetabolism.

Incidental CT findings: Bilateral carotid atherosclerosis. Multiple
small bilateral jugular chain nodes, likely related to the clinical
history of chronic lymphocytic leukemia. None of the nodes are
individually pathologic by size criteria.

CHEST: High right paratracheal node measures 11 mm and a S.U.V. max
of 2.9 on 81/4.

A low right paratracheal node measures 1.3 cm and a S.U.V. max of
3.0 on 96/4.

No pulmonary parenchymal hypermetabolism.

Incidental CT findings: Mild cardiomegaly. Aortic and coronary
artery atherosclerosis. Pulmonary artery enlargement, outflow tract
3.1 cm mild centrilobular emphysema. Nodules along the right minor
fissure are unchanged compared [DATE] and can be presumed
benign.

ABDOMEN/PELVIS: No abdominopelvic nodal hypermetabolism. No splenic
hypermetabolism.

Incidental CT findings: Subcentimeter segment 2 hepatic lesion is
likely a cyst. Splenomegaly, including at maximally 17.7 cm
transverse. Compare 16.8 cm on [DATE]. Retroaortic left renal
vein. Multiple small abdominal retroperitoneal nodes, none
pathologic by size criteria. Relatively similar in size to
[DATE] dedicated CT.

9 mm right external iliac node on 208/4 measured 7 mm on the prior.
Right external iliac node measures 1.0 cm on 196/4 and is similar to
on the prior.

Moderate prostatomegaly.

SKELETON: No abnormal marrow activity.

Incidental CT findings: Bilateral hip degenerative changes.

EXTREMITIES: No abnormal hypermetabolism.

Incidental CT findings: none
IMPRESSION: 1. No typical findings of metastatic disease from scalp melanoma.
2. Hypermetabolism about the medial right eyelid, most likely
physiologic. Consider physical exam correlation.
3. Increased number and less so size of nodes within the neck,
chest, abdomen, and pelvis. Likely related to the clinical history
of chronic lymphocytic leukemia. Only the mediastinal nodes
demonstrate low-level hypermetabolism.
4. Persistent splenomegaly, also likely related to chronic
lymphocytic leukemia.
5. Aortic atherosclerosis ([CD]-[CD]), coronary artery
atherosclerosis and emphysema ([CD]-[CD]).
6. Pulmonary artery enlargement suggests pulmonary arterial
hypertension.

## 2020-02-08 IMAGING — MR MR HEAD WO/W CM
14 series · 48 of 48 positions shown · IV contrast (gadavist)
Comparison: None.

CLINICAL DATA: Melanoma.

EXAM:
MRI HEAD WITHOUT AND WITH CONTRAST
TECHNIQUE: Multiplanar, multiecho pulse sequences of the brain and surrounding
structures were obtained without and with intravenous contrast.
CONTRAST:  8mL GADAVIST GADOBUTROL 1 MMOL/ML IV SOLN

[Series 5: DWI · axial · 3.0mm · 1.36mm/px · z∈[-12,+117]mm · 5 of 104 slices shown (1 of 4)]
[im 1/104]
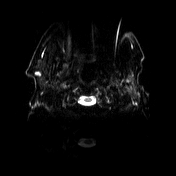
[im 26/104]
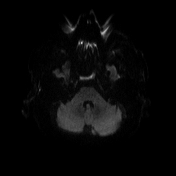
[im 52/104]
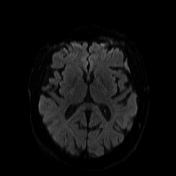
[im 78/104]
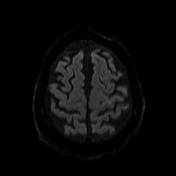
[im 104/104]
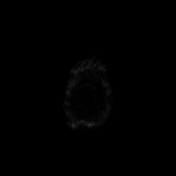

[Series 6: DWI · axial · 3.0mm · 1.36mm/px · z∈[-12,+117]mm · 3 of 52 slices shown (2 of 4)]
[im 1/52]
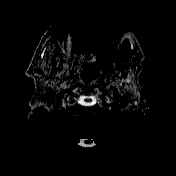
[im 26/52]
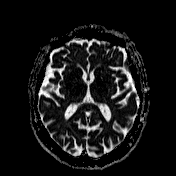
[im 52/52]
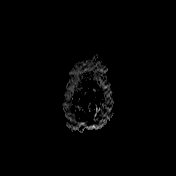

[Series 7: T1 · sagittal · 5.0mm · 0.75mm/px · 1 of 24 slices shown (1 of 2)]
[im 1/24]
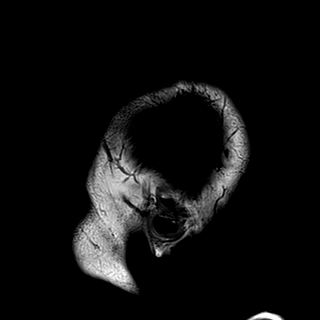

[Series 8: T2 · axial · 5.0mm · 0.62mm/px · 1 of 26 slices shown]
[im 1/26]
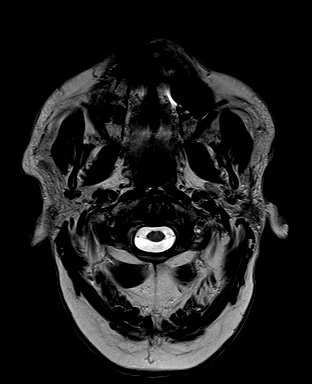

[Series 9: mip_images(sw) · axial · 24.0mm · 0.75mm/px · z∈[-5,+116]mm · 3 of 49 slices shown]
[im 1/49]
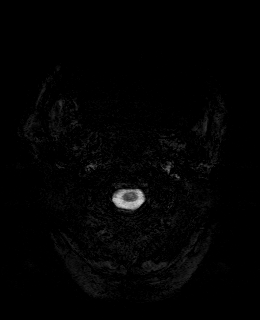
[im 25/49]
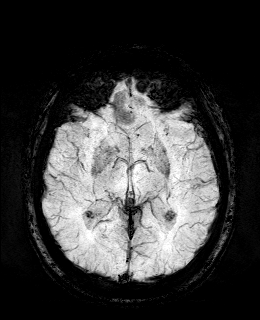
[im 49/49]
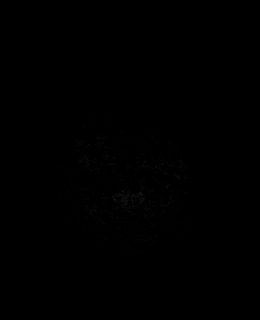

[Series 10: swi_images · axial · 3.0mm · 0.75mm/px · z∈[-14,+125]mm · 3 of 56 slices shown]
[im 1/56]
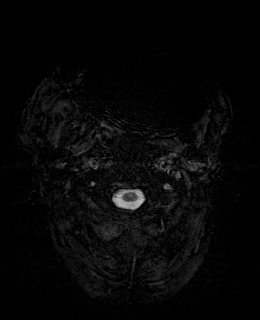
[im 28/56]
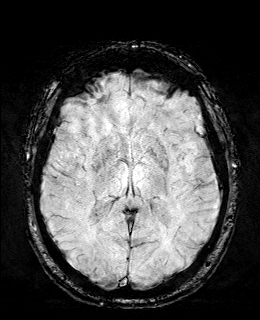
[im 56/56]
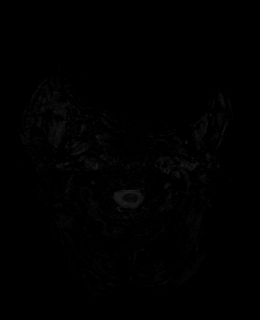

[Series 11: FLAIR · axial · 3.0mm · 0.75mm/px · z∈[-12,+122]mm · 3 of 54 slices shown]
[im 1/54]
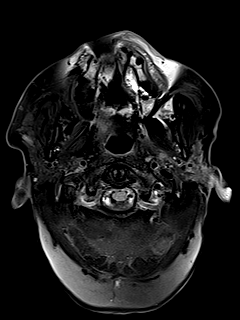
[im 27/54]
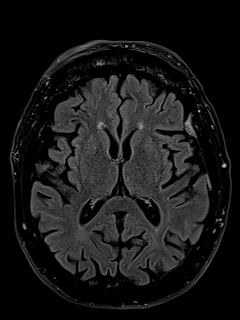
[im 54/54]
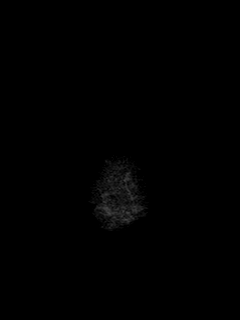

[Series 12: T1 · axial · 1.0mm · 0.94mm/px · z∈[-12,+122]mm · 9 of 160 slices shown (2 of 2)]
[im 1/160]
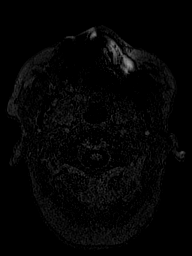
[im 20/160]
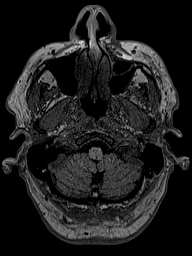
[im 40/160]
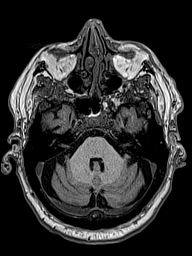
[im 60/160]
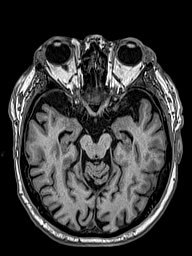
[im 80/160]
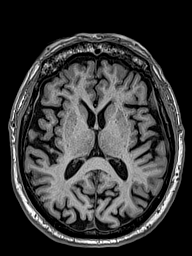
[im 100/160]
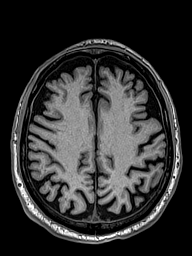
[im 120/160]
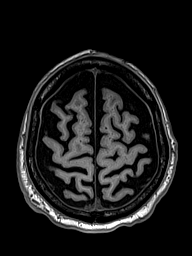
[im 140/160]
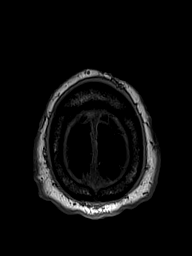
[im 160/160]
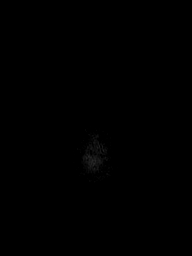

[Series 13: DWI · coronal · 5.0mm · 1.31mm/px · 4 of 72 slices shown (3 of 4)]
[im 1/72]
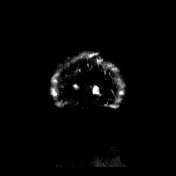
[im 24/72]
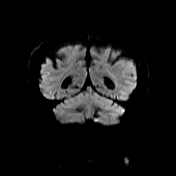
[im 48/72]
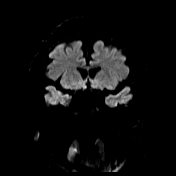
[im 72/72]
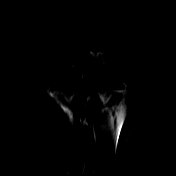

[Series 14: DWI · coronal · 5.0mm · 1.31mm/px · 2 of 36 slices shown (4 of 4)]
[im 1/36]
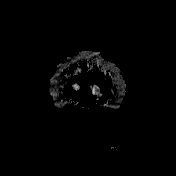
[im 36/36]
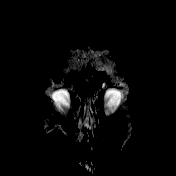

[Series 15: T2 post-contrast · coronal · 5.0mm · 0.57mm/px · 2 of 28 slices shown]
[im 1/28]
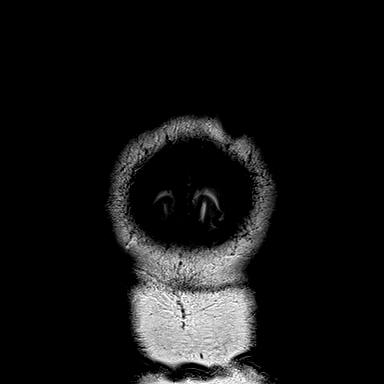
[im 28/28]
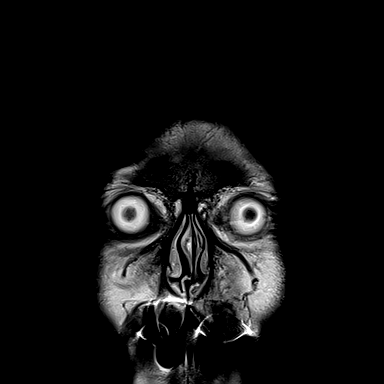

[Series 16: T1 post-contrast · axial · 1.0mm · 0.94mm/px · z∈[-12,+122]mm · 9 of 160 slices shown (1 of 3)]
[im 1/160]
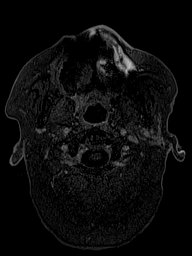
[im 20/160]
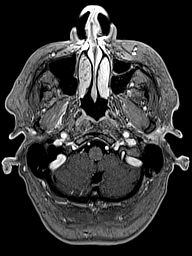
[im 40/160]
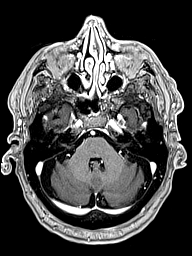
[im 60/160]
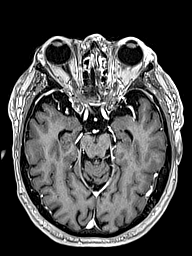
[im 80/160]
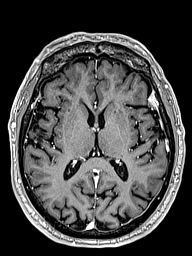
[im 100/160]
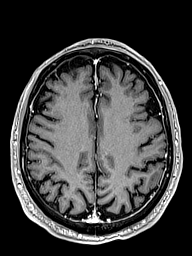
[im 120/160]
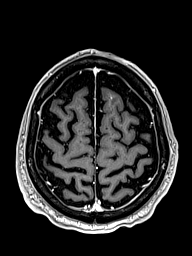
[im 140/160]
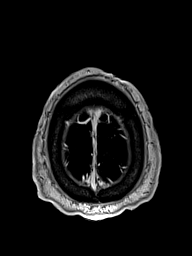
[im 160/160]
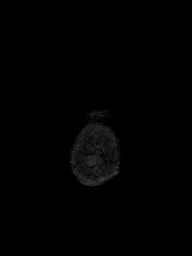

[Series 17: T1 post-contrast · coronal · 5.0mm · 0.43mm/px · 2 of 28 slices shown (2 of 3)]
[im 1/28]
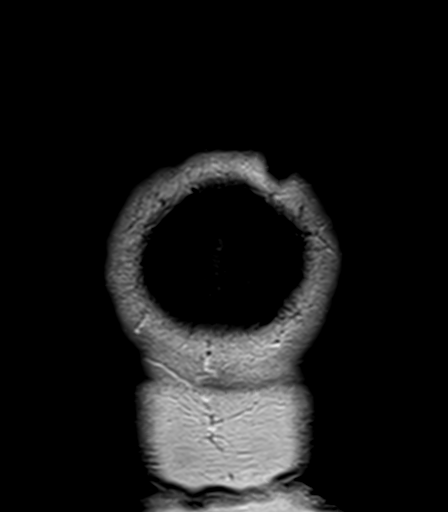
[im 28/28]
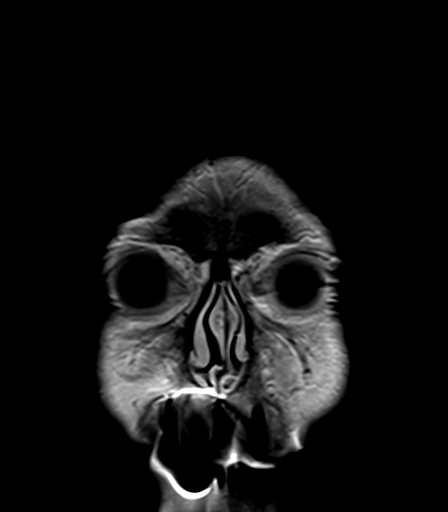

[Series 18: T1 post-contrast · sagittal · 5.0mm · 0.75mm/px · 1 of 24 slices shown (3 of 3)]
[im 1/24]
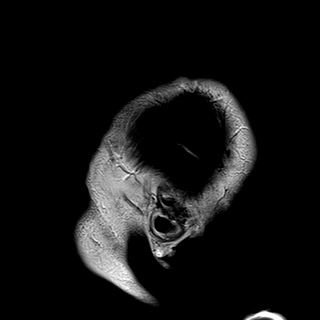

[48 of 48 positions shown; findings below may reference images not displayed]

FINDINGS: Brain: 2 focal areas of susceptibility are present. The largest is
in the medial inferior left cerebellum. T2 signal changes are most
consistent with a cavernous hemangioma. No enhancement is present to
suggest metastatic disease. An additional punctate focus of
susceptibility is present in the right occipital lobe without
visible lesion on T2 sequence or any significant enhancement.

Developmental venous anomaly is present in the left superior frontal
gyrus. A second developmental venous anomaly is present in the
anterior inferior left cerebellum. No pathologic parenchymal
enhancement is present to suggest metastatic disease.

A dural-based lesion overlying the left frontal operculum measures
1.5 x 1.5 x 0.6 cm. Slight diffusion signal and enhancement are
consistent with a meningioma. Additional dural-based lesions are
present.

The ventricles are of normal size. No significant extraaxial fluid
collection is present. The internal auditory canals are within
normal limits.

Vascular: Flow is present in the major intracranial arteries.

Skull and upper cervical spine: Degenerative changes are present at
C1-2. Upper cervical spine is otherwise normal. Craniocervical
junction is normal.

Sinuses/Orbits: Mild mucosal thickening is present throughout the
paranasal sinuses. No fluid levels are present. Minimal fluid is
present in the left mastoid air cells. The globes and orbits are
within normal limits.
IMPRESSION: 1. 2 focal areas of susceptibility as described above. The largest
is in the medial inferior left cerebellum and has T2 signal
characteristics of a cavernous hemangioma. While melanoma metastases
2 typically hemorrhage, there is no significant enhancement of
either area to suggest metastatic disease. Recommend short-term
follow-up MRI of the brain in 2-3 months to assure stability.
2. Otherwise normal MRI appearance of the brain for age without
evidence for metastatic disease elsewhere.
3. 1.5 x 1.5 x 0.6 cm dural-based lesion overlying the left frontal
operculum compatible with a meningioma.
4. Mild sinus disease.

## 2020-02-08 MED ORDER — GADOBUTROL 1 MMOL/ML IV SOLN
8.0000 mL | Freq: Once | INTRAVENOUS | Status: AC | PRN
  Administered 2020-02-08: 8 mL via INTRAVENOUS

## 2020-02-08 MED ORDER — FLUDEOXYGLUCOSE F - 18 (FDG) INJECTION
9.8100 | Freq: Once | INTRAVENOUS | Status: AC | PRN
  Administered 2020-02-08: 9.81 via INTRAVENOUS

## 2020-02-10 NOTE — Progress Notes (Signed)
HEMATOLOGY/ONCOLOGY CLINIC  Date of Service: 02/11/2020  Patient Care Team: Corrington, Thomas Grana, MD as PCP - General (Family Medicine)  CHIEF COMPLAINTS/PURPOSE OF CONSULTATION:  Chronic Lymphocytic Leukemia Recently diagnosed Melanoma  HISTORY OF PRESENTING ILLNESS:   Thomas Zavala is a wonderful 86 y.o. male who has been referred to [place] by Thomas Bienenstock, NP for evaluation and management of his Chronic Lymphocytic Leukemia. The patient has been seen by Thomas Zavala in the past. He is accompanied today by his daughter. The pt reports that he is doing well overall.   The pt reports that he had ease of bruising in 2017 at which time he sought out a physical and was observed to have lymphocytosis and was diagnosed with CLL in November 2017. The pt denies having any other symptoms at that time. The pt has not had treatment for his CLL.  The pt notes that he has had some sweating at night between his legs, and feelings of flushness, which he notes started "in the last week or so." The pt denies fevers, chills, and noticing any new lumps or bumps. He has had recurrent squamous cell carcinomas for "several years," in sun-exposed areas. He follows with his dermatologist regularly for skin screenings.  The pt notes that his energy levels have diminished somewhat over the last 3 years, but denies fatigue being a limiting factor. He recently went on a 2 mile hike with his daughter. He has a one level living arrangement. The pt denies frequent or recurrent infections.   The pt notes that he follows with Thomas Zavala in urology for occasional hematuria. He notes that his DM and blood pressure have been well controlled.   The pt notes that he has had both of his pneumonia vaccines in the last 5 years and received his flu shot this season.  Most recent lab results (10/21/18) of CBC w/diff is as follows: all values are WNL except for WBC at WBC at 51.5k, PLT at 73k, Lymphs abs at 46.5k, Monocytes abs at  1.9k. 10/21/18 CMP is pending 10/21/18 LDH is pending  On review of systems, pt reports staying active, eating well, mildly lower energy levels, and denies drenching night sweats, fevers, new fatigue, chills, unexpected weight loss, frequent infections, recurrent infections, leg swelling, abdominal pains, and any other symptoms.  On PMHx the pt reports CLL, DM type II.   INTERVAL HISTORY:   Thomas Zavala returns today for management and evaluation of his CLL. We are joined by the pt's daughter. The patient's last visit with [place] was on 01/20/20. The pt reports that he is doing well overall.  The pt reports he is good. He is scheduled for eye surgery on July 23rd. His eye has been bothering him and he has it patched up. Pt still has nerve damage from previous melanoma surgery and was told that it will improve with time. He is taking centrum silver supplements. Pt is going to see a urologist for prostate bleeding.    Of note since the patient's last visit, pt has had PET Whole Body ([id number]) completed on 02/08/20 with results revealing "1. No typical findings of metastatic disease from scalp melanoma. 2. Hypermetabolism about the medial right eyelid, most likely physiologic. Consider physical exam correlation. 3. Increased number and less so size of nodes within the neck, chest, abdomen, and pelvis. Likely related to the clinical history of chronic lymphocytic leukemia. Only the mediastinal nodes demonstrate low-level hypermetabolism. 4. Persistent splenomegaly, also likely related to chronic  lymphocytic leukemia. 5. Aortic atherosclerosis (ICD10-I70.0), coronary artery atherosclerosis and emphysema (ICD10-J43.9). 6. Pulmonary artery enlargement suggests pulmonary arterial hypertension." Pt had MR Brain w wo Contrast ([id number]) completed on 02/08/20 with results revealing "1. 2 focal areas of susceptibility as described above. The largest is in the medial inferior left cerebellum and has T2 signal  characteristics of a cavernous hemangioma. While melanoma metastases 2 typically hemorrhage, there is no significant enhancement of either area to suggest metastatic disease. Recommend short-term follow-up MRI of the brain in 2-3 months to assure stability. 2. Otherwise normal MRI appearance of the brain for age without evidence for metastatic disease elsewhere. 3. 1.5 x 1.5 x 0.6 cm dural-based lesion overlying the left frontal operculum compatible with a meningioma. 4. Mild sinus disease."  Lab results today (02/11/20) of CBC w/diff and CMP is as follows: all values are WNL except for WBC at 49.9K, RBC at 4.14, Platelets at 64K, Lymph Abs at 44.7K, Monocytes Absolute at 2.4K, Sodium at 129, Chloride at 91, Glucose at 197, Total Protein at 5.7, Total Bilirubin at 1.3, GFR, Est Non Af Am at 58 02/11/20 of LDH at 120: WNL 11/12/19 of Molecular Pathology at not detected   On review of systems, pt reports eye pain and denies any other symptoms.   MEDICAL HISTORY:  Past Medical History:  Diagnosis Date   Abdominal hernia    BPH (benign prostatic hyperplasia) 07/25/2016   CLL (chronic lymphocytic leukemia) ([place])    Hypertension    Melanoma of forehead ([place])    Melanoma of scalp ([place])    PVC's (premature ventricular contractions)    Type 2 diabetes mellitus without complication, without long-term current use of insulin ([place]) 07/25/2016    SURGICAL HISTORY: Past Surgical History:  Procedure Laterality Date   EYE SURGERY     eye lids   IRRIGATION AND DEBRIDEMENT OF WOUND WITH SPLIT THICKNESS SKIN GRAFT Left 01/05/2020   Procedure: SPLIT THICKNESS SKIN GRAFT Left shoulder;  Surgeon: [place] Going, DO;  Location: [facility];  Service: Plastics;  Laterality: Left;   MASS EXCISION N/A 01/05/2020   Procedure: EXCISION OF FOREHEAD  MELANOMA;  Surgeon: [place] Going, DO;  Location: [facility];  Service: Plastics;  Laterality: N/A;   PAROTIDECTOMY Left 01/05/2020   Procedure: PAROTIDECTOMY;   Surgeon: Izora Gala, MD;  Location: [facility];  Service: ENT;  Laterality: Left;   PROSTATE SURGERY     SENTINEL NODE BIOPSY Left 01/05/2020   Procedure: Sentinel Node Biopsy;  Surgeon: Izora Gala, MD;  Location: [facility];  Service: ENT;  Laterality: Left;    SOCIAL HISTORY: Social History   Socioeconomic History   Marital status: Widowed    Spouse name: Not on file   Number of children: 3   Years of education: Not on file   Highest education level: Not on file  Occupational History   Not on file  Tobacco Use   Smoking status: Former Smoker    Years: 25.00    Types: Cigarettes   Smokeless tobacco: Never Used   Tobacco comment: quit in the 70's  Vaping Use   Vaping Use: Never used  Substance and Sexual Activity   Alcohol use: Yes    Comment: socially.   Drug use: No   Sexual activity: Not on file  Other Topics Concern   Not on file  Social History Narrative   Not on file   Social Determinants of Health   Financial Resource Strain:    Difficulty of Paying Living  Expenses:   Food Insecurity:    Worried About Charity fundraiser in the Last Year:    Arboriculturist in the Last Year:   Transportation Needs:    Film/video editor (Medical):    Lack of Transportation (Non-Medical):   Physical Activity:    Days of Exercise per Week:    Minutes of Exercise per Session:   Stress:    Feeling of Stress :   Social Connections:    Frequency of Communication with Friends and Family:    Frequency of Social Gatherings with Friends and Family:    Attends Religious Services:    Active Member of Clubs or Organizations:    Attends Music therapist:    Marital Status:   Intimate Partner Violence:    Fear of Current or Ex-Partner:    Emotionally Abused:    Physically Abused:    Sexually Abused:     FAMILY HISTORY: Family History  Problem Relation Age of Onset   Hypertension Father     ALLERGIES:  is allergic to  fluorouracil.  MEDICATIONS:  Current Outpatient Medications  Medication Sig Dispense Refill   Carboxymethylcellul-Glycerin (LUBRICATING EYE DROPS OP) Place 1 drop into both eyes daily as needed (dry eyes).     losartan-hydrochlorothiazide (HYZAAR) 100-25 MG tablet Take 1 tablet by mouth daily.      metFORMIN (GLUCOPHAGE-XR) 500 MG 24 hr tablet Take 500 mg by mouth daily.      metoprolol tartrate (LOPRESSOR) 50 MG tablet Take 1 tablet (50 mg total) by mouth 3 (three) times daily. 270 tablet 1   pravastatin (PRAVACHOL) 40 MG tablet Take 40 mg by mouth daily.      HYDROcodone-acetaminophen (NORCO) 7.5-325 MG tablet Take 1 tablet by mouth every 6 (six) hours as needed for moderate pain. (Patient not taking: Reported on 02/11/2020) 20 tablet 0   promethazine (PHENERGAN) 25 MG suppository Place 1 suppository (25 mg total) rectally every 6 (six) hours as needed for nausea or vomiting. (Patient not taking: Reported on 02/11/2020) 12 suppository 1   No current facility-administered medications for this visit.    REVIEW OF SYSTEMS:   A 10+ POINT REVIEW OF SYSTEMS WAS OBTAINED including neurology, dermatology, psychiatry, cardiac, respiratory, lymph, extremities, GI, GU, Musculoskeletal, constitutional, breasts, reproductive, HEENT.  All pertinent positives are noted in the HPI.  All others are negative.   PHYSICAL EXAMINATION: ECOG PERFORMANCE STATUS: 2 - Symptomatic, <50% confined to bed  . Vitals:   02/11/20 0844  BP: (!) 152/66  Pulse: 65  Resp: 18  Temp: 97.7 F (36.5 C)  SpO2: 100%   Filed Weights   02/11/20 0844  Weight: 185 lb 6.4 oz (84.1 kg)   Body mass index is 25.86 kg/m.  Exam given in chair   GENERAL:alert, in no acute distress and comfortable SKIN: no acute rashes, no significant lesions EYES: conjunctiva are pink and non-injected, sclera anicteric OROPHARYNX: MMM, no exudates, no oropharyngeal erythema or ulceration NECK: supple, no JVD LYMPH:  no palpable  lymphadenopathy in the cervical, axillary or inguinal regions LUNGS: clear to auscultation b/l with normal respiratory effort HEART: regular rate & rhythm ABDOMEN:  normoactive bowel sounds , non tender, not distended. Extremity:  pedal edema bilaterally PSYCH: alert & oriented x 3 with fluent speech NEURO: no focal motor/sensory deficits  LABORATORY DATA:  I have reviewed the data as listed  . CBC Latest Ref Rng & Units 02/11/2020 01/05/2020 10/19/2019  WBC 4.0 - 10.5 K/uL 49.4(H) 80.9(HH)  76.1(HH)  Hemoglobin 13.0 - 17.0 g/dL 14.0 16.2 15.7  Hematocrit 39 - 52 % 41.4 48.2 45.8  Platelets 150 - 400 K/uL 64(L) 100(L) 80(L)    . CMP Latest Ref Rng & Units 02/11/2020 01/05/2020 10/19/2019  Glucose 70 - 99 mg/dL 197(H) 146(H) 152(H)  BUN 8 - 23 mg/dL 9 9 10   Creatinine 0.61 - 1.24 mg/dL 1.12 1.06 1.11  Sodium 135 - 145 mmol/L 129(L) 127(L) 134(L)  Potassium 3.5 - 5.1 mmol/L 3.5 5.2(H) 3.6  Chloride 98 - 111 mmol/L 91(L) 89(L) 94(L)  CO2 22 - 32 mmol/L 28 24 32  Calcium 8.9 - 10.3 mg/dL 9.0 9.1 8.8(L)  Total Protein 6.5 - 8.1 g/dL 5.7(L) - 6.5  Total Bilirubin 0.3 - 1.2 mg/dL 1.3(H) - 1.2  Alkaline Phos 38 - 126 U/L 98 - 99  AST 15 - 41 U/L 17 - 22  ALT 0 - 44 U/L 11 - 14    07/15/16 Cytogenetics and Flow Cytometry:   11/12/19 of Molecular Pathology   01/05/20 of Surgical Pathology Report [id number])  RADIOGRAPHIC STUDIES: I have personally reviewed the radiological images as listed and agreed with the findings in the report. MR Brain W Wo Contrast  Result Date: 02/09/2020 CLINICAL DATA:  Melanoma. EXAM: MRI HEAD WITHOUT AND WITH CONTRAST TECHNIQUE: Multiplanar, multiecho pulse sequences of the brain and surrounding structures were obtained without and with intravenous contrast. CONTRAST:  89m GADAVIST GADOBUTROL 1 MMOL/ML IV SOLN COMPARISON:  None. FINDINGS: Brain: 2 focal areas of susceptibility are present. The largest is in the medial inferior left cerebellum. T2 signal  changes are most consistent with a cavernous hemangioma. No enhancement is present to suggest metastatic disease. An additional punctate focus of susceptibility is present in the right occipital lobe without visible lesion on T2 sequence or any significant enhancement. Developmental venous anomaly is present in the left superior frontal gyrus. A second developmental venous anomaly is present in the anterior inferior left cerebellum. No pathologic parenchymal enhancement is present to suggest metastatic disease. A dural-based lesion overlying the left frontal operculum measures 1.5 x 1.5 x 0.6 cm. Slight diffusion signal and enhancement are consistent with a meningioma. Additional dural-based lesions are present. The ventricles are of normal size. No significant extraaxial fluid collection is present. The internal auditory canals are within normal limits. Vascular: Flow is present in the major intracranial arteries. Skull and upper cervical spine: Degenerative changes are present at C1-2. Upper cervical spine is otherwise normal. Craniocervical junction is normal. Sinuses/Orbits: Mild mucosal thickening is present throughout the paranasal sinuses. No fluid levels are present. Minimal fluid is present in the left mastoid air cells. The globes and orbits are within normal limits. IMPRESSION: 1. 2 focal areas of susceptibility as described above. The largest is in the medial inferior left cerebellum and has T2 signal characteristics of a cavernous hemangioma. While melanoma metastases 2 typically hemorrhage, there is no significant enhancement of either area to suggest metastatic disease. Recommend short-term follow-up MRI of the brain in 2-3 months to assure stability. 2. Otherwise normal MRI appearance of the brain for age without evidence for metastatic disease elsewhere. 3. 1.5 x 1.5 x 0.6 cm dural-based lesion overlying the left frontal operculum compatible with a meningioma. 4. Mild sinus disease. Electronically  Signed   By: CSan MorelleM.D.   On: 02/09/2020 06:05   NM PET Image Initial (PI) Whole Body  Result Date: 02/08/2020 CLINICAL DATA:  Initial treatment strategy for cutaneous melanoma of the forehead. Second COVID  vaccine in February. History of lymphocytic leukemia. EXAM: NUCLEAR MEDICINE PET WHOLE BODY TECHNIQUE: 9.8 mCi F-18 FDG was injected intravenously. Full-ring PET imaging was performed from the skull base to thigh after the radiotracer. CT data was obtained and used for attenuation correction and anatomic localization. Fasting blood glucose: 138 mg/dl COMPARISON:  10/10/2017 chest CT.  09/03/2016 abdominopelvic CT. FINDINGS: Mediastinal blood pool activity: SUV max 3.0 HEAD/NECK: Hypermetabolism about the medial right eyelid is without CT correlate, including at a S.U.V. max of 7.5 on 31/4. No significant hypermetabolism about the forehead, including a site of subtle asymmetric right-sided scalp soft tissue thickening on 20/4. No cervical nodal hypermetabolism. Incidental CT findings: Bilateral carotid atherosclerosis. Multiple small bilateral jugular chain nodes, likely related to the clinical history of chronic lymphocytic leukemia. None of the nodes are individually pathologic by size criteria. CHEST: High right paratracheal node measures 11 mm and a S.U.V. max of 2.9 on 81/4. A low right paratracheal node measures 1.3 cm and a S.U.V. max of 3.0 on 96/4. No pulmonary parenchymal hypermetabolism. Incidental CT findings: Mild cardiomegaly. Aortic and coronary artery atherosclerosis. Pulmonary artery enlargement, outflow tract 3.1 cm mild centrilobular emphysema. Nodules along the right minor fissure are unchanged compared 10/10/2017 and can be presumed benign. ABDOMEN/PELVIS: No abdominopelvic nodal hypermetabolism. No splenic hypermetabolism. Incidental CT findings: Subcentimeter segment 2 hepatic lesion is likely a cyst. Splenomegaly, including at maximally 17.7 cm transverse. Compare 16.8 cm  on 09/03/2016. Retroaortic left renal vein. Multiple small abdominal retroperitoneal nodes, none pathologic by size criteria. Relatively similar in size to 09/03/2016 dedicated CT. 9 mm right external iliac node on 208/4 measured 7 mm on the prior. Right external iliac node measures 1.0 cm on 196/4 and is similar to on the prior. Moderate prostatomegaly. SKELETON: No abnormal marrow activity. Incidental CT findings: Bilateral hip degenerative changes. EXTREMITIES: No abnormal hypermetabolism. Incidental CT findings: none IMPRESSION: 1. No typical findings of metastatic disease from scalp melanoma. 2. Hypermetabolism about the medial right eyelid, most likely physiologic. Consider physical exam correlation. 3. Increased number and less so size of nodes within the neck, chest, abdomen, and pelvis. Likely related to the clinical history of chronic lymphocytic leukemia. Only the mediastinal nodes demonstrate low-level hypermetabolism. 4. Persistent splenomegaly, also likely related to chronic lymphocytic leukemia. 5. Aortic atherosclerosis (ICD10-I70.0), coronary artery atherosclerosis and emphysema (ICD10-J43.9). 6. Pulmonary artery enlargement suggests pulmonary arterial hypertension. Electronically Signed   By: Abigail Miyamoto M.D.   On: 02/08/2020 14:52     ASSESSMENT & PLAN:  86 y.o. male with  1. Chronic Lymphocytic Leukemia Diagnosed in November 2017 with flow cytometry 07/15/16 Cytogenetics revealed Trisomy 12  09/03/16 CT A/P revealed Borderline splenomegaly with 1.3 cm low-attenuation lesion in the anterior aspect of spleen. Mild upper abdominal and right iliac lymphadenopathy. 4 mm indeterminate pulmonary nodule in right lung base. Recommend continued attention on follow-up CT. Incidentally noted benign right hepatic lobe hemangioma, mildly and enlarged prostate, and colonic diverticulosis  10/10/17 CT Chest revealed Scattered small bilateral pulmonary nodules, including two dominant right middle lobe  nodules measuring 5-6 mm, unchanged, likely benign. If clinically warranted, consider a single follow-up CT chest in 1 year. Mild mediastinal and bilateral hilar lymphadenopathy, likely related to the patient's known CLL. Mild splenomegaly, incompletely visualized. Aortic Atherosclerosis and Emphysema  2. 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 depth (atleast Stage IIIB)  PLAN: -Discussed pt labwork today, 02/11/20;  of CBC w/diff and CMP is as follows: all values are WNL except for  WBC at 49.9K, RBC at 4.14, Platelets at 64K, Lymph Abs at 44.7K, Monocytes Absolute at 2.4K, Sodium at 129, Chloride at 91, Glucose at 197, Total Protein at 5.7, Total Bilirubin at 1.3, GFR, Est Non Af Am at 58 -Discussed 02/11/20 of LDH at 120: WNL -Discussed 02/08/20 of PET Whole Body ([id number])  -Discussed 06/22/21of MR Brain w wo Contrast ([id number])  Discussed 11/12/19 of Molecular Pathology BRAF mutation not detected  -Will get repeat MRI in 3-4 months to recheck the cavernous hemangioma and meningioma -Advised will need to continue to watch platelets  -Advised on lymph node resection in region  -Advised pt of symptoms to watch for headaches, new skin lesions, new lumps/bumps -Advised pt again that his CLL will increase his risk of non-Melanoma skin cancers -No lab or clinical evidence of symptomatic CLL recurrence/progression at this time -Recommend pt continue to f/u with Dermatologist for routine screening -Advised on National Guidelines- Stage IIIB -Advised on adjuvant immunotherapy treatment toxicity, disease progression, choose to stop, max 1 year  -Advised on possible side effects of adjuvant immunotherapy- fatigue, skin rashes, diarrhea, abnormal liver functions, inflammation of organ systems   -Advised on melanoma with CLL -Not unreasonable to continue watching CLL with labs and clinic visits for now -Pt prefers this for now -Will see back in 2 months   FOLLOW UP: RTC  with Dr Irene Limbo with labs in 2 months  The total time spent in the appt was 40 minutes and more than 50% was on counseling and direct patient cares.  All of the patient's questions were answered with apparent satisfaction. The patient knows to call the clinic with any problems, questions or concerns.  Thomas Lone MD MS AAHIVMS [facility] Hematology/Oncology Physician [facility] [place]  (Office):       [phone] (Work cell):  [phone] (Fax):           [phone]  02/11/2020 10:31 AM  I, Dawayne Cirri am acting as a scribe for Dr. Sullivan Zavala.   .I have reviewed the above documentation for accuracy and completeness, and I agree with the above. Brunetta Genera MD

## 2020-02-11 ENCOUNTER — Inpatient Hospital Stay: Payer: PPO | Admitting: Hematology

## 2020-02-11 ENCOUNTER — Inpatient Hospital Stay: Payer: PPO

## 2020-02-11 ENCOUNTER — Other Ambulatory Visit: Payer: Self-pay

## 2020-02-11 VITALS — BP 152/66 | HR 65 | Temp 97.7°F | Resp 18 | Ht 71.0 in | Wt 185.4 lb

## 2020-02-11 DIAGNOSIS — C9110 Chronic lymphocytic leukemia of B-cell type not having achieved remission: Secondary | ICD-10-CM

## 2020-02-11 DIAGNOSIS — C4330 Malignant melanoma of unspecified part of face: Secondary | ICD-10-CM | POA: Diagnosis not present

## 2020-02-11 DIAGNOSIS — D696 Thrombocytopenia, unspecified: Secondary | ICD-10-CM

## 2020-02-11 LAB — CBC WITH DIFFERENTIAL/PLATELET
Abs Immature Granulocytes: 0.04 10*3/uL (ref 0.00–0.07)
Basophils Absolute: 0 10*3/uL (ref 0.0–0.1)
Basophils Relative: 0 %
Eosinophils Absolute: 0 10*3/uL (ref 0.0–0.5)
Eosinophils Relative: 0 %
HCT: 41.4 % (ref 39.0–52.0)
Hemoglobin: 14 g/dL (ref 13.0–17.0)
Immature Granulocytes: 0 %
Lymphocytes Relative: 90 %
Lymphs Abs: 44.7 10*3/uL — ABNORMAL HIGH (ref 0.7–4.0)
MCH: 33.8 pg (ref 26.0–34.0)
MCHC: 33.8 g/dL (ref 30.0–36.0)
MCV: 100 fL (ref 80.0–100.0)
Monocytes Absolute: 2.4 10*3/uL — ABNORMAL HIGH (ref 0.1–1.0)
Monocytes Relative: 5 %
Neutro Abs: 2.2 10*3/uL (ref 1.7–7.7)
Neutrophils Relative %: 5 %
Platelets: 64 10*3/uL — ABNORMAL LOW (ref 150–400)
RBC: 4.14 MIL/uL — ABNORMAL LOW (ref 4.22–5.81)
RDW: 13.9 % (ref 11.5–15.5)
WBC: 49.4 10*3/uL — ABNORMAL HIGH (ref 4.0–10.5)
nRBC: 0 % (ref 0.0–0.2)

## 2020-02-11 LAB — CMP (CANCER CENTER ONLY)
ALT: 11 U/L (ref 0–44)
AST: 17 U/L (ref 15–41)
Albumin: 3.8 g/dL (ref 3.5–5.0)
Alkaline Phosphatase: 98 U/L (ref 38–126)
Anion gap: 10 (ref 5–15)
BUN: 9 mg/dL (ref 8–23)
CO2: 28 mmol/L (ref 22–32)
Calcium: 9 mg/dL (ref 8.9–10.3)
Chloride: 91 mmol/L — ABNORMAL LOW (ref 98–111)
Creatinine: 1.12 mg/dL (ref 0.61–1.24)
GFR, Est AFR Am: >60 mL/min (ref >60)
GFR, Estimated: 58 mL/min — ABNORMAL LOW (ref >60)
Glucose, Bld: 197 mg/dL — ABNORMAL HIGH (ref 70–99)
Potassium: 3.5 mmol/L (ref 3.5–5.1)
Sodium: 129 mmol/L — ABNORMAL LOW (ref 135–145)
Total Bilirubin: 1.3 mg/dL — ABNORMAL HIGH (ref 0.3–1.2)
Total Protein: 5.7 g/dL — ABNORMAL LOW (ref 6.5–8.1)

## 2020-02-11 LAB — LACTATE DEHYDROGENASE: LDH: 120 U/L (ref 98–192)

## 2020-02-14 ENCOUNTER — Ambulatory Visit: Payer: PPO | Admitting: Surgical

## 2020-02-14 ENCOUNTER — Ambulatory Visit (INDEPENDENT_AMBULATORY_CARE_PROVIDER_SITE_OTHER): Payer: PPO | Admitting: Surgical

## 2020-02-14 ENCOUNTER — Encounter: Payer: Self-pay | Admitting: Surgical

## 2020-02-14 ENCOUNTER — Other Ambulatory Visit: Payer: Self-pay

## 2020-02-14 VITALS — BP 170/91 | HR 66 | Temp 98.4°F | Wt 184.0 lb

## 2020-02-14 DIAGNOSIS — C4442 Squamous cell carcinoma of skin of scalp and neck: Secondary | ICD-10-CM | POA: Diagnosis not present

## 2020-02-14 DIAGNOSIS — C439 Malignant melanoma of skin, unspecified: Secondary | ICD-10-CM

## 2020-02-14 NOTE — Progress Notes (Signed)
   Subjective:     [name], male    DOB: 01/24/1928, 86 y.o.   MRN: [id number]  Chief Complaint  Patient presents with  . Follow-up    HPI:  Patient is an 86 year old male here for follow-up on his forehead full-thickness skin graft.  He underwent placement of skin graft to left forehead by Dr. Marla Roe on 01/05/2020.  He is doing well today.  He reports that he has been applying Vaseline daily.  He has no complaints in regards to his skin graft.  He reports that he has seen oculoplastics in regards to his left eye.   Review of Systems  Eyes: Positive for pain and redness.  Respiratory: Negative.   Cardiovascular: Negative.   Skin: Negative for wound.     Objective:   Vital Signs BP (!) 170/91 (BP Location: Right Arm, Patient Position: Sitting, Cuff Size: Normal)   Pulse 66   Temp 98.4 F (36.9 C) (Temporal)   Wt 184 lb (83.5 kg)   SpO2 98%   BMI 25.66 kg/m  Vital Signs and Nursing Note Reviewed  Physical Exam Constitutional:      General: He is not in acute distress.    Appearance: He is not ill-appearing.  HENT:     Head:      Comments: Left forehead skin graft is well-healed, no wounds noted.  No periwound erythema.  No drainage noted.  No fluid collections noted. Pulmonary:     Effort: Pulmonary effort is normal.  Chest:       Comments: Well-healed skin graft donor site.  No wounds noted.  No drainage.  No erythema. Skin:    General: Skin is warm and dry.  Neurological:     General: No focal deficit present.     Mental Status: He is alert. Mental status is at baseline.  Psychiatric:        Mood and Affect: Mood normal.        Behavior: Behavior normal.     Assessment/Plan:     ICD-10-CM   1. Melanoma of skin ([place])  C43.9     [name] is doing well, he is pleased with his progress.  There is no sign of any infection, seroma, hematoma.  Skin graft and donor site have healed well.    Recommend sunscreen to skin graft recipient  site on forehead when exposed to sun.  Discussed with patient follow-up in 6 months, he reports that at this time he would like to hold off on an additional appointment and will call if he has any questions or concerns.  He is doing well and assures me he will call with any concerns so I think this is reasonable.  Pictures were obtained of the patient and placed in the chart with the patient's or guardian's permission.   Carola Rhine Jerlisa Diliberto, PA-C 02/14/2020, 11:38 AM

## 2020-02-18 ENCOUNTER — Inpatient Hospital Stay: Payer: PPO

## 2020-02-18 ENCOUNTER — Inpatient Hospital Stay: Payer: PPO | Admitting: Hematology

## 2020-02-18 DIAGNOSIS — I700 Atherosclerosis of aorta: Secondary | ICD-10-CM | POA: Diagnosis not present

## 2020-02-18 DIAGNOSIS — D1809 Hemangioma of other sites: Secondary | ICD-10-CM | POA: Diagnosis not present

## 2020-02-18 DIAGNOSIS — M5136 Other intervertebral disc degeneration, lumbar region: Secondary | ICD-10-CM | POA: Diagnosis not present

## 2020-02-18 DIAGNOSIS — K5730 Diverticulosis of large intestine without perforation or abscess without bleeding: Secondary | ICD-10-CM | POA: Diagnosis not present

## 2020-02-18 DIAGNOSIS — R310 Gross hematuria: Secondary | ICD-10-CM | POA: Diagnosis not present

## 2020-03-10 DIAGNOSIS — X32XXXD Exposure to sunlight, subsequent encounter: Secondary | ICD-10-CM | POA: Diagnosis not present

## 2020-03-10 DIAGNOSIS — L570 Actinic keratosis: Secondary | ICD-10-CM | POA: Diagnosis not present

## 2020-03-13 DIAGNOSIS — H02125 Mechanical ectropion of left lower eyelid: Secondary | ICD-10-CM | POA: Diagnosis not present

## 2020-03-13 DIAGNOSIS — H02115 Cicatricial ectropion of left lower eyelid: Secondary | ICD-10-CM | POA: Diagnosis not present

## 2020-03-13 DIAGNOSIS — H02145 Spastic ectropion of left lower eyelid: Secondary | ICD-10-CM | POA: Diagnosis not present

## 2020-03-13 DIAGNOSIS — H02135 Senile ectropion of left lower eyelid: Secondary | ICD-10-CM | POA: Diagnosis not present

## 2020-03-13 DIAGNOSIS — H16213 Exposure keratoconjunctivitis, bilateral: Secondary | ICD-10-CM | POA: Diagnosis not present

## 2020-03-13 DIAGNOSIS — Z8589 Personal history of malignant neoplasm of other organs and systems: Secondary | ICD-10-CM | POA: Diagnosis not present

## 2020-03-13 DIAGNOSIS — H02532 Eyelid retraction right lower eyelid: Secondary | ICD-10-CM | POA: Diagnosis not present

## 2020-03-13 DIAGNOSIS — H02535 Eyelid retraction left lower eyelid: Secondary | ICD-10-CM | POA: Diagnosis not present

## 2020-03-13 DIAGNOSIS — H04523 Eversion of bilateral lacrimal punctum: Secondary | ICD-10-CM | POA: Diagnosis not present

## 2020-03-22 DIAGNOSIS — L820 Inflamed seborrheic keratosis: Secondary | ICD-10-CM | POA: Diagnosis not present

## 2020-03-23 DIAGNOSIS — S51811A Laceration without foreign body of right forearm, initial encounter: Secondary | ICD-10-CM | POA: Diagnosis not present

## 2020-04-13 NOTE — Progress Notes (Signed)
HEMATOLOGY/ONCOLOGY CLINIC  Date of Service: 04/14/2020  Patient Care Team: Corrington, Delsa Grana, MD as PCP - General (Family Medicine)  CHIEF COMPLAINTS/PURPOSE OF CONSULTATION:  Chronic Lymphocytic Leukemia Recently diagnosed Melanoma  HISTORY OF PRESENTING ILLNESS:   Thomas Zavala is a wonderful 86 y.o. male who has been referred to [place] by Bradly Bienenstock, NP for evaluation and management of his Chronic Lymphocytic Leukemia. The patient has been seen by Dr. Murriel Hopper in the past. He is accompanied today by his daughter. The pt reports that he is doing well overall.   The pt reports that he had ease of bruising in 2017 at which time he sought out a physical and was observed to have lymphocytosis and was diagnosed with CLL in November 2017. The pt denies having any other symptoms at that time. The pt has not had treatment for his CLL.  The pt notes that he has had some sweating at night between his legs, and feelings of flushness, which he notes started "in the last week or so." The pt denies fevers, chills, and noticing any new lumps or bumps. He has had recurrent squamous cell carcinomas for "several years," in sun-exposed areas. He follows with his dermatologist regularly for skin screenings.  The pt notes that his energy levels have diminished somewhat over the last 3 years, but denies fatigue being a limiting factor. He recently went on a 2 mile hike with his daughter. He has a one level living arrangement. The pt denies frequent or recurrent infections.   The pt notes that he follows with Dr. Gloriann Loan in urology for occasional hematuria. He notes that his DM and blood pressure have been well controlled.   The pt notes that he has had both of his pneumonia vaccines in the last 5 years and received his flu shot this season.  Most recent lab results (10/21/18) of CBC w/diff is as follows: all values are WNL except for WBC at WBC at 51.5k, PLT at 73k, Lymphs abs at 46.5k, Monocytes abs at  1.9k. 10/21/18 CMP is pending 10/21/18 LDH is pending  On review of systems, pt reports staying active, eating well, mildly lower energy levels, and denies drenching night sweats, fevers, new fatigue, chills, unexpected weight loss, frequent infections, recurrent infections, leg swelling, abdominal pains, and any other symptoms.  On PMHx the pt reports CLL, DM type II.   INTERVAL HISTORY:  Thomas Zavala returns today for management and evaluation of his CLL. The patient's last visit with [place] was on 02/11/2020. The pt reports that he is doing well overall.  The pt reports that his legs are becoming weaker and he has had a few minor falls. Pt received his COVID19 vaccines and tolerated them well. He is interested in receiving the booster. Pt has noticed a new skin lesion on the left side of his face and continues frequent follow up with his Dermatologist.   Lab results today (04/14/20) of CBC w/diff and CMP is as follows: all values are WNL except for WBC at 64.5K, RBC at 4.18, PLT at 76K, Lymphs Abs at 57.5K, Mono Abs at 4.7K, WBC Morphology shows "Large variant lymphocytes seen some with nucleoli", Smudge Cells are "Present", Sodium at 131, Chloride at 94, Glucose at 152, Total Protein at 6.1, GFR Est Non Af Am at 58. 04/14/2020 LDH at 155 04/14/2020 Immature PLT Fract at 4.2  On review of systems, pt reports leg swelling, falls, lower extremity weakness and denies SOB, abnormal/excessive bleeding, fevers, chills, night  sweats, fatigue and any other symptoms.   MEDICAL HISTORY:  Past Medical History:  Diagnosis Date  . Abdominal hernia   . BPH (benign prostatic hyperplasia) 07/25/2016  . CLL (chronic lymphocytic leukemia) ([place])   . Hypertension   . Melanoma of forehead ([place])   . Melanoma of scalp ([place])   . PVC's (premature ventricular contractions)   . Type 2 diabetes mellitus without complication, without long-term current use of insulin ([place]) 07/25/2016    SURGICAL HISTORY: Past Surgical  History:  Procedure Laterality Date  . EYE SURGERY     eye lids  . IRRIGATION AND DEBRIDEMENT OF WOUND WITH SPLIT THICKNESS SKIN GRAFT Left 01/05/2020   Procedure: SPLIT THICKNESS SKIN GRAFT Left shoulder;  Surgeon: Wallace Going, DO;  Location: [place];  Service: Plastics;  Laterality: Left;  [name] MASS EXCISION N/A 01/05/2020   Procedure: EXCISION OF FOREHEAD  MELANOMA;  Surgeon: Wallace Going, DO;  Location: [place];  Service: Plastics;  Laterality: N/A;  . PAROTIDECTOMY Left 01/05/2020   Procedure: PAROTIDECTOMY;  Surgeon: Izora Gala, MD;  Location: [place];  Service: ENT;  Laterality: Left;  . PROSTATE SURGERY    . SENTINEL NODE BIOPSY Left 01/05/2020   Procedure: Sentinel Node Biopsy;  Surgeon: Izora Gala, MD;  Location: [place];  Service: ENT;  Laterality: Left;    SOCIAL HISTORY: Social History   Socioeconomic History  . Marital status: Widowed    Spouse name: Not on file  . Number of children: 3  . Years of education: Not on file  . Highest education level: Not on file  Occupational History  . Not on file  Tobacco Use  . Smoking status: Former Smoker    Years: 25.00    Types: Cigarettes  . Smokeless tobacco: Never Used  . Tobacco comment: quit in the 70's  Vaping Use  . Vaping Use: Never used  Substance and Sexual Activity  . Alcohol use: Yes    Comment: socially.  . Drug use: No  . Sexual activity: Not on file  Other Topics Concern  . Not on file  Social History Narrative  . Not on file   Social Determinants of Health   Financial Resource Strain:   . Difficulty of Paying Living Expenses: Not on file  Food Insecurity:   . Worried About Charity fundraiser in the Last Year: Not on file  . Ran Out of Food in the Last Year: Not on file  Transportation Needs:   . Lack of Transportation (Medical): Not on file  . Lack of Transportation (Non-Medical): Not on file  Physical Activity:   . Days of Exercise per Week: Not on file  . Minutes of Exercise per  Session: Not on file  Stress:   . Feeling of Stress : Not on file  Social Connections:   . Frequency of Communication with Friends and Family: Not on file  . Frequency of Social Gatherings with Friends and Family: Not on file  . Attends Religious Services: Not on file  . Active Member of Clubs or Organizations: Not on file  . Attends Archivist Meetings: Not on file  . Marital Status: Not on file  Intimate Partner Violence:   . Fear of Current or Ex-Partner: Not on file  . Emotionally Abused: Not on file  . Physically Abused: Not on file  . Sexually Abused: Not on file    FAMILY HISTORY: Family History  Problem Relation Age of Onset  . Hypertension Father  ALLERGIES:  is allergic to fluorouracil.  MEDICATIONS:  Current Outpatient Medications  Medication Sig Dispense Refill  . Carboxymethylcellul-Glycerin (LUBRICATING EYE DROPS OP) Place 1 drop into both eyes daily as needed (dry eyes).    [name] HYDROcodone-acetaminophen (NORCO) 7.5-325 MG tablet Take 1 tablet by mouth every 6 (six) hours as needed for moderate pain. 20 tablet 0  . losartan-hydrochlorothiazide (HYZAAR) 100-25 MG tablet Take 1 tablet by mouth daily.     . metFORMIN (GLUCOPHAGE-XR) 500 MG 24 hr tablet Take 500 mg by mouth daily.     . metoprolol tartrate (LOPRESSOR) 50 MG tablet Take 1 tablet (50 mg total) by mouth 3 (three) times daily. 270 tablet 1  . pravastatin (PRAVACHOL) 40 MG tablet Take 40 mg by mouth daily.     . promethazine (PHENERGAN) 25 MG suppository Place 1 suppository (25 mg total) rectally every 6 (six) hours as needed for nausea or vomiting. 12 suppository 1   No current facility-administered medications for this visit.    REVIEW OF SYSTEMS:   A 10+ POINT REVIEW OF SYSTEMS WAS OBTAINED including neurology, dermatology, psychiatry, cardiac, respiratory, lymph, extremities, GI, GU, Musculoskeletal, constitutional, breasts, reproductive, HEENT.  All pertinent positives are noted in the  HPI.  All others are negative.   PHYSICAL EXAMINATION: ECOG PERFORMANCE STATUS: 2 - Symptomatic, <50% confined to bed  . Vitals:   04/14/20 0856  BP: (!) 152/64  Pulse: 64  Resp: 20  Temp: 98.1 F (36.7 C)  SpO2: 98%   Filed Weights   04/14/20 0856  Weight: 186 lb 14.4 oz (84.8 kg)   Body mass index is 26.07 kg/m.   GENERAL:alert, in no acute distress and comfortable SKIN: no acute rashes, no significant lesions EYES: conjunctiva are pink and non-injected, sclera anicteric OROPHARYNX: MMM, no exudates, no oropharyngeal erythema or ulceration NECK: supple, no JVD LYMPH:  no palpable lymphadenopathy in the cervical, axillary or inguinal regions LUNGS: clear to auscultation b/l with normal respiratory effort HEART: regular rate & rhythm ABDOMEN:  normoactive bowel sounds , non tender, not distended. No palpable hepatosplenomegaly.  Extremity: 1+ pedal edema b/l PSYCH: alert & oriented x 3 with fluent speech NEURO: no focal motor/sensory deficits  LABORATORY DATA:  I have reviewed the data as listed  . CBC Latest Ref Rng & Units 04/14/2020 02/11/2020 01/05/2020  WBC 4.0 - 10.5 K/uL 64.5(HH) 49.4(H) 80.9(HH)  Hemoglobin 13.0 - 17.0 g/dL 13.8 14.0 16.2  Hematocrit 39 - 52 % 41.4 41.4 48.2  Platelets 150 - 400 K/uL 76(L) 64(L) 100(L)    . CMP Latest Ref Rng & Units 04/14/2020 02/11/2020 01/05/2020  Glucose 70 - 99 mg/dL 152(H) 197(H) 146(H)  BUN 8 - 23 mg/dL 12 9 9   Creatinine 0.61 - 1.24 mg/dL 1.12 1.12 1.06  Sodium 135 - 145 mmol/L 131(L) 129(L) 127(L)  Potassium 3.5 - 5.1 mmol/L 3.6 3.5 5.2(H)  Chloride 98 - 111 mmol/L 94(L) 91(L) 89(L)  CO2 22 - 32 mmol/L 28 28 24   Calcium 8.9 - 10.3 mg/dL 9.6 9.0 9.1  Total Protein 6.5 - 8.1 g/dL 6.1(L) 5.7(L) -  Total Bilirubin 0.3 - 1.2 mg/dL 1.0 1.3(H) -  Alkaline Phos 38 - 126 U/L 89 98 -  AST 15 - 41 U/L 18 17 -  ALT 0 - 44 U/L 8 11 -    07/15/16 Cytogenetics and Flow Cytometry:   11/12/19 of Molecular Pathology    01/05/20 of Surgical Pathology Report [phone])  RADIOGRAPHIC STUDIES: I have personally reviewed the radiological images  as listed and agreed with the findings in the report. No results found.   ASSESSMENT & PLAN:  86 y.o. male with  1. Chronic Lymphocytic Leukemia Diagnosed in November 2017 with flow cytometry 07/15/16 Cytogenetics revealed Trisomy 12  09/03/16 CT A/P revealed Borderline splenomegaly with 1.3 cm low-attenuation lesion in the anterior aspect of spleen. Mild upper abdominal and right iliac lymphadenopathy. 4 mm indeterminate pulmonary nodule in right lung base. Recommend continued attention on follow-up CT. Incidentally noted benign right hepatic lobe hemangioma, mildly and enlarged prostate, and colonic diverticulosis  10/10/17 CT Chest revealed Scattered small bilateral pulmonary nodules, including two dominant right middle lobe nodules measuring 5-6 mm, unchanged, likely benign. If clinically warranted, consider a single follow-up CT chest in 1 year. Mild mediastinal and bilateral hilar lymphadenopathy, likely related to the patient's known CLL. Mild splenomegaly, incompletely visualized. Aortic Atherosclerosis and Emphysema  2. Newly diagnosed forehead cutaneous melanoma (pT3a, pN1  Mx) 2.66mm depth (atleast Stage IIIB)  PLAN: -Discussed pt labwork today, 04/14/20; WBC are increasing, Hgb is nml. PLT have improved, blood counts are steady, Immature PLT Fract is WNL, LDH is WNL - will continue to monitor PLT -No lab or clinical evidence of symptomatic CLL recurrence/progression at this time. Will continue watchful observation. -Advised pt that although WBC continues to increase we would not move to treat immediately, as we are trying balance his blood counts with the immunosuppressive effects of treatment.   -Discussed CDC guidelines regarding the COVID19 vaccine booster. Plan to give in clinic today. -Recommend pt f/u with Dermatology to discuss his new,  concerning skin lesions.  -Will see back in 3 months with labs. Pt advised to contact sooner if any new concerns.    FOLLOW UP: Covid booster shot today  RTC with Dr Irene Limbo with labs in 3 months    The total time spent in the appt was 20 minutes and more than 50% was on counseling and direct patient cares.  All of the patient's questions were answered with apparent satisfaction. The patient knows to call the clinic with any problems, questions or concerns.   Sullivan Lone MD [place] AAHIVMS [facility] Hematology/Oncology Physician [facility]  (Office):       [phone] (Work cell):  [phone] (Fax):           [phone]  04/14/2020 9:48 AM  I, Yevette Edwards, am acting as a scribe for Dr. Sullivan Lone.   .I have reviewed the above documentation for accuracy and completeness, and I agree with the above. Brunetta Genera MD

## 2020-04-14 ENCOUNTER — Other Ambulatory Visit: Payer: Self-pay

## 2020-04-14 ENCOUNTER — Inpatient Hospital Stay: Payer: PPO | Attending: Hematology

## 2020-04-14 ENCOUNTER — Inpatient Hospital Stay: Payer: PPO | Admitting: Hematology

## 2020-04-14 VITALS — BP 152/64 | HR 64 | Temp 98.1°F | Resp 20 | Ht 71.0 in | Wt 186.9 lb

## 2020-04-14 DIAGNOSIS — C434 Malignant melanoma of scalp and neck: Secondary | ICD-10-CM | POA: Insufficient documentation

## 2020-04-14 DIAGNOSIS — C4330 Malignant melanoma of unspecified part of face: Secondary | ICD-10-CM | POA: Diagnosis not present

## 2020-04-14 DIAGNOSIS — Z87891 Personal history of nicotine dependence: Secondary | ICD-10-CM | POA: Insufficient documentation

## 2020-04-14 DIAGNOSIS — C9110 Chronic lymphocytic leukemia of B-cell type not having achieved remission: Secondary | ICD-10-CM | POA: Insufficient documentation

## 2020-04-14 DIAGNOSIS — D696 Thrombocytopenia, unspecified: Secondary | ICD-10-CM | POA: Diagnosis not present

## 2020-04-14 LAB — CBC WITH DIFFERENTIAL/PLATELET
Abs Immature Granulocytes: 0.07 10*3/uL (ref 0.00–0.07)
Basophils Absolute: 0.1 10*3/uL (ref 0.0–0.1)
Basophils Relative: 0 %
Eosinophils Absolute: 0.1 10*3/uL (ref 0.0–0.5)
Eosinophils Relative: 0 %
HCT: 41.4 % (ref 39.0–52.0)
Hemoglobin: 13.8 g/dL (ref 13.0–17.0)
Immature Granulocytes: 0 %
Lymphocytes Relative: 90 %
Lymphs Abs: 57.5 10*3/uL — ABNORMAL HIGH (ref 0.7–4.0)
MCH: 33 pg (ref 26.0–34.0)
MCHC: 33.3 g/dL (ref 30.0–36.0)
MCV: 99 fL (ref 80.0–100.0)
Monocytes Absolute: 4.7 10*3/uL — ABNORMAL HIGH (ref 0.1–1.0)
Monocytes Relative: 7 %
Neutro Abs: 2.1 10*3/uL (ref 1.7–7.7)
Neutrophils Relative %: 3 %
Platelets: 76 10*3/uL — ABNORMAL LOW (ref 150–400)
RBC: 4.18 MIL/uL — ABNORMAL LOW (ref 4.22–5.81)
RDW: 13.5 % (ref 11.5–15.5)
WBC: 64.5 10*3/uL (ref 4.0–10.5)
nRBC: 0 % (ref 0.0–0.2)

## 2020-04-14 LAB — CMP (CANCER CENTER ONLY)
ALT: 8 U/L (ref 0–44)
AST: 18 U/L (ref 15–41)
Albumin: 4 g/dL (ref 3.5–5.0)
Alkaline Phosphatase: 89 U/L (ref 38–126)
Anion gap: 9 (ref 5–15)
BUN: 12 mg/dL (ref 8–23)
CO2: 28 mmol/L (ref 22–32)
Calcium: 9.6 mg/dL (ref 8.9–10.3)
Chloride: 94 mmol/L — ABNORMAL LOW (ref 98–111)
Creatinine: 1.12 mg/dL (ref 0.61–1.24)
GFR, Est AFR Am: >60 mL/min (ref >60)
GFR, Estimated: 58 mL/min — ABNORMAL LOW (ref >60)
Glucose, Bld: 152 mg/dL — ABNORMAL HIGH (ref 70–99)
Potassium: 3.6 mmol/L (ref 3.5–5.1)
Sodium: 131 mmol/L — ABNORMAL LOW (ref 135–145)
Total Bilirubin: 1 mg/dL (ref 0.3–1.2)
Total Protein: 6.1 g/dL — ABNORMAL LOW (ref 6.5–8.1)

## 2020-04-14 LAB — IMMATURE PLATELET FRACTION: Immature Platelet Fraction: 4.2 % (ref 1.2–8.6)

## 2020-04-14 LAB — LACTATE DEHYDROGENASE: LDH: 155 U/L (ref 98–192)

## 2020-04-27 DIAGNOSIS — E11628 Type 2 diabetes mellitus with other skin complications: Secondary | ICD-10-CM | POA: Diagnosis not present

## 2020-04-27 DIAGNOSIS — S81809A Unspecified open wound, unspecified lower leg, initial encounter: Secondary | ICD-10-CM | POA: Diagnosis not present

## 2020-05-02 ENCOUNTER — Ambulatory Visit: Payer: PPO | Admitting: Podiatry

## 2020-05-02 ENCOUNTER — Encounter: Payer: Self-pay | Admitting: Podiatry

## 2020-05-02 ENCOUNTER — Other Ambulatory Visit: Payer: Self-pay

## 2020-05-02 DIAGNOSIS — M79675 Pain in left toe(s): Secondary | ICD-10-CM | POA: Diagnosis not present

## 2020-05-02 DIAGNOSIS — R600 Localized edema: Secondary | ICD-10-CM

## 2020-05-02 DIAGNOSIS — M79674 Pain in right toe(s): Secondary | ICD-10-CM

## 2020-05-02 DIAGNOSIS — E119 Type 2 diabetes mellitus without complications: Secondary | ICD-10-CM | POA: Diagnosis not present

## 2020-05-02 DIAGNOSIS — B351 Tinea unguium: Secondary | ICD-10-CM | POA: Diagnosis not present

## 2020-05-02 NOTE — Patient Instructions (Addendum)
Diabetes Mellitus and Foot Care Foot care is an important part of your health, especially when you have diabetes. Diabetes may cause you to have problems because of poor blood flow (circulation) to your feet and legs, which can cause your skin to:  Become thinner and drier.  Break more easily.  Heal more slowly.  Peel and crack. You may also have nerve damage (neuropathy) in your legs and feet, causing decreased feeling in them. This means that you may not notice minor injuries to your feet that could lead to more serious problems. Noticing and addressing any potential problems early is the best way to prevent future foot problems. How to care for your feet Foot hygiene  Wash your feet daily with warm water and mild soap. Do not use hot water. Then, pat your feet and the areas between your toes until they are completely dry. Do not soak your feet as this can dry your skin.  Trim your toenails straight across. Do not dig under them or around the cuticle. File the edges of your nails with an emery board or nail file.  Apply a moisturizing lotion or petroleum jelly to the skin on your feet and to dry, brittle toenails. Use lotion that does not contain alcohol and is unscented. Do not apply lotion between your toes. Shoes and socks  Wear clean socks or stockings every day. Make sure they are not too tight. Do not wear knee-high stockings since they may decrease blood flow to your legs.  Wear shoes that fit properly and have enough cushioning. Always look in your shoes before you put them on to be sure there are no objects inside.  To break in new shoes, wear them for just a few hours a day. This prevents injuries on your feet. Wounds, scrapes, corns, and calluses  Check your feet daily for blisters, cuts, bruises, sores, and redness. If you cannot see the bottom of your feet, use a mirror or ask someone for help.  Do not cut corns or calluses or try to remove them with medicine.  If you  find a minor scrape, cut, or break in the skin on your feet, keep it and the skin around it clean and dry. You may clean these areas with mild soap and water. Do not clean the area with peroxide, alcohol, or iodine.  If you have a wound, scrape, corn, or callus on your foot, look at it several times a day to make sure it is healing and not infected. Check for: ? Redness, swelling, or pain. ? Fluid or blood. ? Warmth. ? Pus or a bad smell. General instructions  Do not cross your legs. This may decrease blood flow to your feet.  Do not use heating pads or hot water bottles on your feet. They may burn your skin. If you have lost feeling in your feet or legs, you may not know this is happening until it is too late.  Protect your feet from hot and cold by wearing shoes, such as at the beach or on hot pavement.  Schedule a complete foot exam at least once a year (annually) or more often if you have foot problems. If you have foot problems, report any cuts, sores, or bruises to your health care provider immediately. Contact a health care provider if:  You have a medical condition that increases your risk of infection and you have any cuts, sores, or bruises on your feet.  You have an injury that is not   healing.  You have redness on your legs or feet.  You feel burning or tingling in your legs or feet.  You have pain or cramps in your legs and feet.  Your legs or feet are numb.  Your feet always feel cold.  You have pain around a toenail. Get help right away if:  You have a wound, scrape, corn, or callus on your foot and: ? You have pain, swelling, or redness that gets worse. ? You have fluid or blood coming from the wound, scrape, corn, or callus. ? Your wound, scrape, corn, or callus feels warm to the touch. ? You have pus or a bad smell coming from the wound, scrape, corn, or callus. ? You have a fever. ? You have a red line going up your leg. Summary  Check your feet every day  for cuts, sores, red spots, swelling, and blisters.  Moisturize feet and legs daily.  Wear shoes that fit properly and have enough cushioning.  If you have foot problems, report any cuts, sores, or bruises to your health care provider immediately.  Schedule a complete foot exam at least once a year (annually) or more often if you have foot problems. This information is not intended to replace advice given to you by your health care provider. Make sure you discuss any questions you have with your health care provider.  Document Revised: 04/28/2019 Document Reviewed: 09/06/2016 Elsevier Patient Education  [place].   Diabetic Neuropathy Diabetic neuropathy refers to nerve damage that is caused by diabetes (diabetes mellitus). Over time, people with diabetes can develop nerve damage throughout the body. There are several types of diabetic neuropathy:  Peripheral neuropathy. This is the most common type of diabetic neuropathy. It causes damage to nerves that carry signals between the spinal cord and other parts of the body (peripheral nerves). This usually affects nerves in the feet and legs first, and may eventually affect the hands and arms. The damage affects the ability to sense touch or temperature.  Autonomic neuropathy. This type causes damage to nerves that control involuntary functions (autonomic nerves). These nerves carry signals that control: ? Heartbeat. ? Body temperature. ? Blood pressure. ? Urination. ? Digestion. ? Sweating. ? Sexual function. ? Response to changing blood sugar (glucose) levels.  Focal neuropathy. This type of nerve damage affects one area of the body, such as an arm, a leg, or the face. The injury may involve one nerve or a small group of nerves. Focal neuropathy can be painful and unpredictable, and occurs most often in older adults with diabetes. This often develops suddenly, but usually improves over time and does not cause long-term  problems.  Proximal neuropathy. This type of nerve damage affects the nerves of the thighs, hips, buttocks, or legs. It causes severe pain, weakness, and muscle death (atrophy), usually in the thigh muscles. It is more common among older men and people who have type 2 diabetes. The length of recovery time may vary. What are the causes? Peripheral, autonomic, and focal neuropathies are caused by diabetes that is not well controlled with treatment. The cause of proximal neuropathy is not known, but it may be caused by inflammation related to uncontrolled blood glucose levels. What are the signs or symptoms? Peripheral neuropathy Peripheral neuropathy develops slowly over time. When the nerves of the feet and legs no longer work, you may experience:  Burning, stabbing, or aching pain in the legs or feet.  Pain or cramping in the legs or  feet.  Loss of feeling (numbness) and inability to feel pressure or pain in the feet. This can lead to: ? Thick calluses or sores on areas of constant pressure. ? Ulcers. ? Reduced ability to feel temperature changes.  Foot deformities.  Muscle weakness.  Loss of balance or coordination. Autonomic neuropathy The symptoms of autonomic neuropathy vary depending on which nerves are affected. Symptoms may include:  Problems with digestion, such as: ? Nausea or vomiting. ? Poor appetite. ? Bloating. ? Diarrhea or constipation. ? Trouble swallowing. ? Losing weight without trying to.  Problems with the heart, blood and lungs, such as: ? Dizziness, especially when standing up. ? Fainting. ? Shortness of breath. ? Irregular heartbeat.  Bladder problems, such as: ? Trouble starting or stopping urination. ? Leaking urine. ? Trouble emptying the bladder. ? Urinary tract infections (UTIs).  Problems with other body functions, such as: ? Sweat. You may sweat too much or too little. ? Temperature. You might get hot easily. Or, you might feel cold more  than usual. ? Sexual function. Men may not be able to get or maintain an erection. Women may have vaginal dryness and difficulty with arousal. Focal neuropathy Symptoms affect only one area of the body. Common symptoms include:  Numbness.  Tingling.  Burning pain.  Prickling feeling.  Very sensitive skin.  Weakness.  Inability to move (paralysis).  Muscle twitching.  Muscles getting smaller (wasting).  Poor coordination.  Double or blurred vision. Proximal neuropathy  Sudden, severe pain in the hip, thigh, or buttocks. Pain may spread from the back into the legs (sciatica).  Pain and numbness in the arms and legs.  Tingling.  Loss of bladder control or bowel control.  Weakness and wasting of thigh muscles.  Difficulty getting up from a seated position.  Abdominal swelling.  Unexplained weight loss. How is this diagnosed? Diagnosis usually involves reviewing your medical history and any symptoms you have. Diagnosis varies depending on the type of neuropathy your health care provider suspects. Peripheral neuropathy Your health care provider will check areas that are affected by your nervous system (neurologic exam), such as your reflexes, how you move, and what you can feel. You may have other tests, such as:  Blood tests.  Removal and examination of fluid that surrounds the spinal cord (lumbar puncture).  CT scan.  MRI.  A test to check the nerves that control muscles (electromyogram, EMG).  Tests of how quickly messages pass through your nerves (nerve conduction velocity tests).  Removal of a small piece of nerve to be examined under a microscope (biopsy). Autonomic neuropathy You may have tests, such as:  Tests to measure your blood pressure and heart rate. This may include monitoring you while you are safely secured to an exam table that moves you from a lying position to an upright position (table tilt test).  Breathing tests to check your  lungs.  Tests to check how food moves through the digestive system (gastric emptying tests).  Blood, sweat, or urine tests.  Ultrasound of your bladder.  Spinal fluid tests. Focal neuropathy This condition may be diagnosed with:  A neurologic exam.  CT scan.  MRI.  EMG.  Nerve conduction velocity tests. Proximal neuropathy There is no test to diagnose this type of neuropathy. You may have tests to rule out other possible causes of this type of neuropathy. Tests may include:  X-rays of your spine and lumbar region.  Lumbar puncture.  MRI. How is this treated? The goal of  treatment is to keep nerve damage from getting worse. The most important part of treatment is keeping your blood glucose level and your A1C level within your target range by following your diabetes management plan. Over time, maintaining lower blood glucose levels helps lessen symptoms. In some cases, you may need prescription pain medicine. Follow these instructions at home:  Lifestyle   Do not use any products that contain nicotine or tobacco, such as cigarettes and e-cigarettes. If you need help quitting, ask your health care provider.  Be physically active every day. Include strength training and balance exercises.  Follow a healthy meal plan.  Work with your health care provider to manage your blood pressure. General instructions  Follow your diabetes management plan as directed. ? Check your blood glucose levels as directed by your health care provider. ? Keep your blood glucose in your target range as directed by your health care provider. ? Have your A1C level checked at least two times a year, or as often as told by your health care provider.  Take over the counter and prescription medicines only as told by your health care provider. This includes insulin and diabetes medicine.  Do not drive or use heavy machinery while taking prescription pain medicines.  Check your skin and feet every  day for cuts, bruises, redness, blisters, or sores.  Keep all follow up visits as told by your health care provider. This is important. Contact a health care provider if:  You have burning, stabbing, or aching pain in your legs or feet.  You are unable to feel pressure or pain in your feet.  You develop problems with digestion, such as: ? Nausea. ? Vomiting. ? Bloating. ? Constipation. ? Diarrhea. ? Abdominal pain.  You have difficulty with urination, such as inability: ? To control when you urinate (incontinence). ? To completely empty the bladder (retention).  You have palpitations.  You feel dizzy, weak, or faint when you stand up. Get help right away if:  You cannot urinate.  You have sudden weakness or loss of coordination.  You have trouble speaking.  You have pain or pressure in your chest.  You have an irregular heart beat.  You have sudden inability to move a part of your body. Summary  Diabetic neuropathy refers to nerve damage that is caused by diabetes. It can affect nerves throughout the entire body, causing numbness and pain in the arms, legs, digestive tract, heart, and other body systems.  Keep your blood glucose level and your blood pressure in your target range, as directed by your health care provider. This can help prevent neuropathy from getting worse.  Check your skin and feet every day for cuts, bruises, redness, blisters, or sores.  Do not use any products that contain nicotine or tobacco, such as cigarettes and e-cigarettes. If you need help quitting, ask your health care provider. This information is not intended to replace advice given to you by your health care provider. Make sure you discuss any questions you have with your health care provider. Document Revised: 09/17/2017 Document Reviewed: 09/09/2016 Elsevier Patient Education  2020 [place].   Edema  Edema is an abnormal buildup of fluids in the body tissues and under the  skin. Swelling of the legs, feet, and ankles is a common symptom that becomes more likely as you get older. Swelling is also common in looser tissues, like around the eyes. When the affected area is squeezed, the fluid may move out of that spot and  leave a dent for a few moments. This dent is called pitting edema. There are many possible causes of edema. Eating too much salt (sodium) and being on your feet or sitting for a long time can cause edema in your legs, feet, and ankles. Hot weather may make edema worse. Common causes of edema include:  Heart failure.  Liver or kidney disease.  Weak leg blood vessels.  Cancer.  An injury.  Pregnancy.  Medicines.  Being obese.  Low protein levels in the blood. Edema is usually painless. Your skin may look swollen or shiny. Follow these instructions at home:  Keep the affected body part raised (elevated) above the level of your heart when you are sitting or lying down.  Do not sit still or stand for long periods of time.  Do not wear tight clothing. Do not wear garters on your upper legs.  Exercise your legs to get your circulation going. This helps to move the fluid back into your blood vessels, and it may help the swelling go down.  Wear elastic bandages or support stockings to reduce swelling as told by your health care provider.  Eat a low-salt (low-sodium) diet to reduce fluid as told by your health care provider.  Depending on the cause of your swelling, you may need to limit how much fluid you drink (fluid restriction).  Take over-the-counter and prescription medicines only as told by your health care provider. Contact a health care provider if:  Your edema does not get better with treatment.  You have heart, liver, or kidney disease and have symptoms of edema.  You have sudden and unexplained weight gain. Get help right away if:  You develop shortness of breath or chest pain.  You cannot breathe when you lie  down.  You develop pain, redness, or warmth in the swollen areas.  You have heart, liver, or kidney disease and suddenly get edema.  You have a fever and your symptoms suddenly get worse. Summary  Edema is an abnormal buildup of fluids in the body tissues and under the skin.  Eating too much salt (sodium) and being on your feet or sitting for a long time can cause edema in your legs, feet, and ankles.  Keep the affected body part raised (elevated) above the level of your heart when you are sitting or lying down. This information is not intended to replace advice given to you by your health care provider. Make sure you discuss any questions you have with your health care provider. Document Revised: 12/23/2018 Document Reviewed: 09/07/2016 Elsevier Patient Education  [place].  Onychomycosis/Fungal Toenails  WHAT IS IT? An infection that lies within the keratin of your nail plate that is caused by a fungus.  WHY ME? Fungal infections affect all ages, sexes, races, and creeds.  There may be many factors that predispose you to a fungal infection such as age, coexisting medical conditions such as diabetes, or an autoimmune disease; stress, medications, fatigue, genetics, etc.  Bottom line: fungus thrives in a warm, moist environment and your shoes offer such a location.  IS IT CONTAGIOUS? Theoretically, yes.  You do not want to share shoes, nail clippers or files with someone who has fungal toenails.  Walking around barefoot in the same room or sleeping in the same bed is unlikely to transfer the organism.  It is important to realize, however, that fungus can spread easily from one nail to the next on the same foot.  HOW DO WE TREAT  THIS?  There are several ways to treat this condition.  Treatment may depend on many factors such as age, medications, pregnancy, liver and kidney conditions, etc.  It is best to ask your doctor which options are available to you.  18. No treatment.    Unlike many other medical concerns, you can live with this condition.  However for many people this can be a painful condition and may lead to ingrown toenails or a bacterial infection.  It is recommended that you keep the nails cut short to help reduce the amount of fungal nail. 19. Topical treatment.  These range from herbal remedies to prescription strength nail lacquers.  About 40-50% effective, topicals require twice daily application for approximately 9 to 12 months or until an entirely new nail has grown out.  The most effective topicals are medical grade medications available through physicians offices. 20. Oral antifungal medications.  With an 80-90% cure rate, the most common oral medication requires 3 to 4 months of therapy and stays in your system for a year as the new nail grows out.  Oral antifungal medications do require blood work to make sure it is a safe drug for you.  A liver function panel will be performed prior to starting the medication and after the first month of treatment.  It is important to have the blood work performed to avoid any harmful side effects.  In general, this medication safe but blood work is required. 21. Laser Therapy.  This treatment is performed by applying a specialized laser to the affected nail plate.  This therapy is noninvasive, fast, and non-painful.  It is not covered by insurance and is therefore, out of pocket.  The results have been very good with a 80-95% cure rate.  [facility] is the only practice in the area to offer this therapy. 22. Permanent Nail Avulsion.  Removing the entire nail so that a new nail will not grow back.
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
## 2020-05-05 DIAGNOSIS — R338 Other retention of urine: Secondary | ICD-10-CM | POA: Diagnosis not present

## 2020-05-05 DIAGNOSIS — R310 Gross hematuria: Secondary | ICD-10-CM | POA: Diagnosis not present

## 2020-05-08 DIAGNOSIS — R338 Other retention of urine: Secondary | ICD-10-CM | POA: Diagnosis not present

## 2020-05-08 DIAGNOSIS — N401 Enlarged prostate with lower urinary tract symptoms: Secondary | ICD-10-CM | POA: Diagnosis not present

## 2020-05-08 DIAGNOSIS — R310 Gross hematuria: Secondary | ICD-10-CM | POA: Diagnosis not present

## 2020-05-09 DIAGNOSIS — Z1283 Encounter for screening for malignant neoplasm of skin: Secondary | ICD-10-CM | POA: Diagnosis not present

## 2020-05-09 DIAGNOSIS — Z08 Encounter for follow-up examination after completed treatment for malignant neoplasm: Secondary | ICD-10-CM | POA: Diagnosis not present

## 2020-05-09 DIAGNOSIS — X32XXXD Exposure to sunlight, subsequent encounter: Secondary | ICD-10-CM | POA: Diagnosis not present

## 2020-05-09 DIAGNOSIS — Z85820 Personal history of malignant melanoma of skin: Secondary | ICD-10-CM | POA: Diagnosis not present

## 2020-05-09 DIAGNOSIS — L570 Actinic keratosis: Secondary | ICD-10-CM | POA: Diagnosis not present

## 2020-05-09 DIAGNOSIS — D225 Melanocytic nevi of trunk: Secondary | ICD-10-CM | POA: Diagnosis not present

## 2020-05-30 DIAGNOSIS — X32XXXD Exposure to sunlight, subsequent encounter: Secondary | ICD-10-CM | POA: Diagnosis not present

## 2020-05-30 DIAGNOSIS — L570 Actinic keratosis: Secondary | ICD-10-CM | POA: Diagnosis not present

## 2020-05-30 DIAGNOSIS — C44329 Squamous cell carcinoma of skin of other parts of face: Secondary | ICD-10-CM | POA: Diagnosis not present

## 2020-06-01 DIAGNOSIS — R3912 Poor urinary stream: Secondary | ICD-10-CM | POA: Diagnosis not present

## 2020-06-01 DIAGNOSIS — R338 Other retention of urine: Secondary | ICD-10-CM | POA: Diagnosis not present

## 2020-06-05 ENCOUNTER — Ambulatory Visit: Payer: PPO | Admitting: Cardiology
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
## 2020-06-15 ENCOUNTER — Ambulatory Visit: Payer: PPO | Admitting: Student

## 2020-06-15 ENCOUNTER — Encounter: Payer: Self-pay | Admitting: Student

## 2020-06-15 ENCOUNTER — Other Ambulatory Visit: Payer: Self-pay

## 2020-06-15 VITALS — BP 130/70 | HR 69 | Resp 16 | Ht 71.0 in | Wt 187.0 lb

## 2020-06-15 DIAGNOSIS — R079 Chest pain, unspecified: Secondary | ICD-10-CM | POA: Diagnosis not present

## 2020-06-15 DIAGNOSIS — I440 Atrioventricular block, first degree: Secondary | ICD-10-CM

## 2020-06-15 DIAGNOSIS — I493 Ventricular premature depolarization: Secondary | ICD-10-CM

## 2020-06-15 DIAGNOSIS — I10 Essential (primary) hypertension: Secondary | ICD-10-CM | POA: Diagnosis not present

## 2020-06-15 MED ORDER — METOPROLOL TARTRATE 50 MG PO TABS: 50.0000 mg | ORAL_TABLET | Freq: Three times a day (TID) | ORAL | 3 refills | Status: DC

## 2020-06-27 DIAGNOSIS — X32XXXD Exposure to sunlight, subsequent encounter: Secondary | ICD-10-CM | POA: Diagnosis not present

## 2020-06-27 DIAGNOSIS — Z08 Encounter for follow-up examination after completed treatment for malignant neoplasm: Secondary | ICD-10-CM | POA: Diagnosis not present

## 2020-06-27 DIAGNOSIS — Z85828 Personal history of other malignant neoplasm of skin: Secondary | ICD-10-CM | POA: Diagnosis not present

## 2020-06-27 DIAGNOSIS — L570 Actinic keratosis: Secondary | ICD-10-CM | POA: Diagnosis not present

## 2020-07-03 DIAGNOSIS — C44329 Squamous cell carcinoma of skin of other parts of face: Secondary | ICD-10-CM | POA: Diagnosis not present

## 2020-07-03 DIAGNOSIS — C44229 Squamous cell carcinoma of skin of left ear and external auricular canal: Secondary | ICD-10-CM | POA: Diagnosis not present

## 2020-07-11 ENCOUNTER — Telehealth: Payer: Self-pay

## 2020-07-11 DIAGNOSIS — Z9181 History of falling: Secondary | ICD-10-CM | POA: Diagnosis not present

## 2020-07-11 DIAGNOSIS — C9110 Chronic lymphocytic leukemia of B-cell type not having achieved remission: Secondary | ICD-10-CM | POA: Diagnosis not present

## 2020-07-11 DIAGNOSIS — I10 Essential (primary) hypertension: Secondary | ICD-10-CM | POA: Diagnosis not present

## 2020-07-11 DIAGNOSIS — D696 Thrombocytopenia, unspecified: Secondary | ICD-10-CM | POA: Diagnosis not present

## 2020-07-11 DIAGNOSIS — Z125 Encounter for screening for malignant neoplasm of prostate: Secondary | ICD-10-CM | POA: Diagnosis not present

## 2020-07-11 DIAGNOSIS — Z0000 Encounter for general adult medical examination without abnormal findings: Secondary | ICD-10-CM | POA: Diagnosis not present

## 2020-07-11 DIAGNOSIS — C434 Malignant melanoma of scalp and neck: Secondary | ICD-10-CM | POA: Diagnosis not present

## 2020-07-11 DIAGNOSIS — E782 Mixed hyperlipidemia: Secondary | ICD-10-CM | POA: Diagnosis not present

## 2020-07-11 DIAGNOSIS — E11628 Type 2 diabetes mellitus with other skin complications: Secondary | ICD-10-CM | POA: Diagnosis not present

## 2020-07-11 DIAGNOSIS — S81801A Unspecified open wound, right lower leg, initial encounter: Secondary | ICD-10-CM | POA: Diagnosis not present

## 2020-07-11 NOTE — Telephone Encounter (Signed)
Received a call from patient's daughter regarding a recent "episode." Episode began on Sunday night, 11/21. Patient had an episode of lethargy. Patient had two glasses of wine and ate a decent amount of food, per daughter. Soon after patient slumped over in his chair and exhibited stroke like symptoms. Patient had labored breathing and was unable to lift head up. Patient could not form words and could only mumble. EMS was called.Patient could not move his L arm. Patient then threw up a small amount. EMT arrived and patient was now alert and oriented. Patient does not have any memory of incident. Patient refused to go to the hospital. Daughter would like to know if patient needs to be seen at our office.Checked BP, all was normal. Please advise. Thanks! AD/S

## 2020-07-14 ENCOUNTER — Ambulatory Visit: Payer: PPO | Admitting: Hematology

## 2020-07-14 ENCOUNTER — Other Ambulatory Visit: Payer: PPO

## 2020-07-19 ENCOUNTER — Inpatient Hospital Stay: Payer: PPO | Attending: Family Medicine

## 2020-07-19 ENCOUNTER — Inpatient Hospital Stay: Payer: PPO | Admitting: Hematology

## 2020-07-20 ENCOUNTER — Telehealth: Payer: Self-pay | Admitting: Hematology

## 2020-07-20 NOTE — Telephone Encounter (Signed)
Called pt per 12/1 sch msg - unable to reach pt-  left message for patient to call back to reschedule appt.

## 2020-07-24 DIAGNOSIS — C44329 Squamous cell carcinoma of skin of other parts of face: Secondary | ICD-10-CM | POA: Diagnosis not present

## 2020-08-01 ENCOUNTER — Ambulatory Visit: Payer: PPO | Admitting: Podiatry

## 2020-08-01 ENCOUNTER — Encounter: Payer: Self-pay | Admitting: Podiatry

## 2020-08-01 ENCOUNTER — Other Ambulatory Visit: Payer: Self-pay

## 2020-08-01 DIAGNOSIS — E119 Type 2 diabetes mellitus without complications: Secondary | ICD-10-CM | POA: Diagnosis not present

## 2020-08-01 DIAGNOSIS — C44319 Basal cell carcinoma of skin of other parts of face: Secondary | ICD-10-CM | POA: Diagnosis not present

## 2020-08-01 DIAGNOSIS — C44329 Squamous cell carcinoma of skin of other parts of face: Secondary | ICD-10-CM | POA: Diagnosis not present

## 2020-08-01 DIAGNOSIS — Z08 Encounter for follow-up examination after completed treatment for malignant neoplasm: Secondary | ICD-10-CM | POA: Diagnosis not present

## 2020-08-01 DIAGNOSIS — B351 Tinea unguium: Secondary | ICD-10-CM

## 2020-08-01 DIAGNOSIS — Z1283 Encounter for screening for malignant neoplasm of skin: Secondary | ICD-10-CM | POA: Diagnosis not present

## 2020-08-01 DIAGNOSIS — M79675 Pain in left toe(s): Secondary | ICD-10-CM

## 2020-08-01 DIAGNOSIS — Z85820 Personal history of malignant melanoma of skin: Secondary | ICD-10-CM | POA: Diagnosis not present

## 2020-08-01 DIAGNOSIS — M79674 Pain in right toe(s): Secondary | ICD-10-CM

## 2020-08-01 DIAGNOSIS — D225 Melanocytic nevi of trunk: Secondary | ICD-10-CM | POA: Diagnosis not present
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
## 2020-08-07 ENCOUNTER — Other Ambulatory Visit: Payer: Self-pay

## 2020-08-07 ENCOUNTER — Encounter (HOSPITAL_BASED_OUTPATIENT_CLINIC_OR_DEPARTMENT_OTHER): Payer: PPO | Attending: Internal Medicine | Admitting: Internal Medicine

## 2020-08-07 ENCOUNTER — Telehealth: Payer: Self-pay | Admitting: *Deleted

## 2020-08-07 DIAGNOSIS — Z85820 Personal history of malignant melanoma of skin: Secondary | ICD-10-CM | POA: Insufficient documentation

## 2020-08-07 DIAGNOSIS — L570 Actinic keratosis: Secondary | ICD-10-CM | POA: Diagnosis not present

## 2020-08-07 DIAGNOSIS — D0471 Carcinoma in situ of skin of right lower limb, including hip: Secondary | ICD-10-CM | POA: Diagnosis not present

## 2020-08-07 DIAGNOSIS — C44602 Unspecified malignant neoplasm of skin of right upper limb, including shoulder: Secondary | ICD-10-CM | POA: Diagnosis not present

## 2020-08-07 DIAGNOSIS — D0461 Carcinoma in situ of skin of right upper limb, including shoulder: Secondary | ICD-10-CM | POA: Diagnosis not present

## 2020-08-07 DIAGNOSIS — Z87891 Personal history of nicotine dependence: Secondary | ICD-10-CM | POA: Insufficient documentation

## 2020-08-07 DIAGNOSIS — L97811 Non-pressure chronic ulcer of other part of right lower leg limited to breakdown of skin: Secondary | ICD-10-CM | POA: Diagnosis not present

## 2020-08-07 DIAGNOSIS — E11622 Type 2 diabetes mellitus with other skin ulcer: Secondary | ICD-10-CM | POA: Insufficient documentation

## 2020-08-07 DIAGNOSIS — I10 Essential (primary) hypertension: Secondary | ICD-10-CM | POA: Insufficient documentation

## 2020-08-07 DIAGNOSIS — X32XXXD Exposure to sunlight, subsequent encounter: Secondary | ICD-10-CM | POA: Diagnosis not present

## 2020-08-07 DIAGNOSIS — L97812 Non-pressure chronic ulcer of other part of right lower leg with fat layer exposed: Secondary | ICD-10-CM | POA: Diagnosis not present

## 2020-08-07 NOTE — Progress Notes (Signed)
Thomas Zavala ([id number]) , Visit Report for 08/07/2020 Allergy List Details Patient Name: Date of Service: [place] RRETT, [place] WE 08/07/2020 9:00 [place] Record Number: [id number] Patient Account Number: [id number] Date of Birth/Sex: Treating RN: 1927/09/04 (86 y.o. Jerilynn Mages) Carlene Coria Primary Care Aloysuis Ribaudo: Ledora Bottcher Other Clinician: Referring Beola Vasallo: Treating Sergey Ishler/Extender: Linton Ham Corrington, Kip Weeks in Treatment: 0 Allergies Active Allergies fluorouracil Type: Food Allergy Notes Electronic Signature(s) Signed: 08/07/2020 5:24:04 PM By: Carlene Coria RN Entered By: Carlene Coria on 08/07/2020 09:53:28 -------------------------------------------------------------------------------- [place] Details Patient Name: Date of Service: [place] Wendie Agreste, [place] 08/07/2020 9:00 [place] Number: [id number] Patient Account Number: [id number] Date of Birth/Sex: Treating RN: 1927/09/27 (86 y.o. Jerilynn Mages) Carlene Coria Primary Care Nam Vossler: Ledora Bottcher Other Clinician: Referring Ilyse Tremain: Treating Yomira Flitton/Extender: Annia Belt Weeks in Treatment: 0 Visit Information Patient Arrived: Ambulatory Arrival Time: 09:50 Accompanied By: daughter Transfer Assistance: None Patient Identification Verified: Yes Secondary Verification Process Completed: Yes Patient Requires Transmission-Based Precautions: No Patient Has Alerts: No Electronic Signature(s) Signed: 08/07/2020 5:24:04 PM By: Carlene Coria RN Entered By: Carlene Coria on 08/07/2020 09:50:56 -------------------------------------------------------------------------------- Clinic Level of Care Assessment Details Patient Name: Date of Service: [place] RRETT, RO WE 08/07/2020 9:00 [place] Record Number: [id number] Patient Account Number: [id number] Date of Birth/Sex: Treating RN: [birth year] (86 y.o. Janyth Contes Primary Care Kees Idrovo: Ledora Bottcher Other Clinician: Referring  Laderius Valbuena: Treating Daphene Chisholm/Extender: Alyce Pagan, Kip Weeks in Treatment: 0 Clinic Level of Care Assessment Items TOOL 2 Quantity Score X- 1 0 Use when only an EandM is performed on the INITIAL visit ASSESSMENTS - Nursing Assessment / Reassessment X- 1 20 General Physical Exam (combine w/ comprehensive assessment (listed just below) when performed on new pt. evals) X- 1 25 Comprehensive Assessment (HX, ROS, Risk Assessments, Wounds Hx, etc.) ASSESSMENTS - Wound and Skin A ssessment / Reassessment []  - 0 Simple Wound Assessment / Reassessment - one wound []  - 0 Complex Wound Assessment / Reassessment - multiple wounds []  - 0 Dermatologic / Skin Assessment (not related to wound area) ASSESSMENTS - Ostomy and/or Continence Assessment and Care []  - 0 Incontinence Assessment and Management []  - 0 Ostomy Care Assessment and Management (repouching, etc.) PROCESS - Coordination of Care X - Simple Patient / Family Education for ongoing care 1 15 []  - 0 Complex (extensive) Patient / Family Education for ongoing care X- 1 10 Staff obtains Programmer, systems, Records, T Results / Process Orders est []  - 0 Staff telephones HHA, Nursing Homes / Clarify orders / etc []  - 0 Routine Transfer to another Facility (non-emergent condition) []  - 0 Routine Hospital Admission (non-emergent condition) []  - 0 New Admissions / Biomedical engineer / Ordering NPWT Apligraf, etc. , []  - 0 Emergency Hospital Admission (emergent condition) X- 1 10 Simple Discharge Coordination []  - 0 Complex (extensive) Discharge Coordination PROCESS - Special Needs []  - 0 Pediatric / Minor Patient Management []  - 0 Isolation Patient Management []  - 0 Hearing / Language / Visual special needs []  - 0 Assessment of Community assistance (transportation, D/C planning, etc.) []  - 0 Additional assistance / Altered mentation []  - 0 Support Surface(s) Assessment (bed, cushion, seat, etc.) INTERVENTIONS  - Wound Cleansing / Measurement X- 1 5 Wound Imaging (photographs - any number of wounds) []  - 0 Wound Tracing (instead of photographs) X- 1 5 Simple Wound Measurement - one wound []  - 0 Complex Wound Measurement - multiple wounds X- 1 5 Simple Wound Cleansing - one wound []  - 0  Complex Wound Cleansing - multiple wounds INTERVENTIONS - Wound Dressings []  - 0 Small Wound Dressing one or multiple wounds []  - 0 Medium Wound Dressing one or multiple wounds []  - 0 Large Wound Dressing one or multiple wounds []  - 0 Application of Medications - injection INTERVENTIONS - Miscellaneous []  - 0 External ear exam []  - 0 Specimen Collection (cultures, biopsies, blood, body fluids, etc.) []  - 0 Specimen(s) / Culture(s) sent or taken to Lab for analysis []  - 0 Patient Transfer (multiple staff / Harrel Lemon Lift / Similar devices) []  - 0 Simple Staple / Suture removal (25 or less) []  - 0 Complex Staple / Suture removal (26 or more) []  - 0 Hypo / Hyperglycemic Management (close monitor of Blood Glucose) X- 1 15 Ankle / Brachial Index (ABI) - do not check if billed separately Has the patient been seen at the hospital within the last three years: Yes Total Score: 110 Level Of Care: New/Established - Level 3 Electronic Signature(s) Signed: 08/07/2020 5:38:15 PM By: Levan Hurst RN, BSN Entered By: Levan Hurst on 08/07/2020 10:35:54 -------------------------------------------------------------------------------- Lower Extremity Assessment Details Patient Name: Date of Service: [place] RRETT, [place] WE 08/07/2020 9:00 A M Medical Record Number: [id number] Patient Account Number: [id number] Date of Birth/Sex: Treating RN: 02/03/1928 (86 y.o. Jerilynn Mages) Carlene Coria Primary Care Paytin Ramakrishnan: Ledora Bottcher Other Clinician: Referring Costella Schwarz: Treating Messiyah Waterson/Extender: Alyce Pagan, Kip Weeks in Treatment: 0 Edema Assessment Assessed: [Left: No] [Right: No] [Left: Edema] [Right:  :] Calf Left: Right: Point of Measurement: 43 cm From Medial Instep 33 cm Ankle Left: Right: Point of Measurement: 11 cm From Medial Instep 23 cm Vascular Assessment Blood Pressure: Brachial: [Right:163] Ankle: [Right:Dorsalis Pedis: 160 0.98] Electronic Signature(s) Signed: 08/07/2020 5:24:04 PM By: Carlene Coria RN Entered By: Carlene Coria on 08/07/2020 10:15:49 -------------------------------------------------------------------------------- Multi Wound Chart Details Patient Name: Date of Service: Encarnacion Chu, [place] 08/07/2020 9:00 [place] Record Number: [id number] Patient Account Number: [id number] Date of Birth/Sex: Treating RN: 1928-06-14 (86 y.o. Janyth Contes Primary Care Kailie Polus: Ledora Bottcher Other Clinician: Referring Nolan Tuazon: Treating Christopherjame Carnell/Extender: Alyce Pagan, Kip Weeks in Treatment: 0 Vital Signs Height(in): 71 Pulse(bpm): 65 Weight(lbs): 185 Blood Pressure(mmHg): 163/74 Body Mass Index(BMI): 26 Temperature(F): 98.2 Respiratory Rate(breaths/min): 18 Photos: [1:No Photos Right Lower Leg] [N/A:N/A N/A] Wound Location: [1:Trauma] [N/A:N/A] Wounding Event: [1:Atypical] [N/A:N/A] Primary Etiology: [1:Arrhythmia, Hypertension, Type II] [N/A:N/A] Comorbid History: [1:Diabetes 02/23/2020] [N/A:N/A] Date Acquired: [1:0] [N/A:N/A] Weeks of Treatment: [1:Open] [N/A:N/A] Wound Status: [1:2x2.5x0.1] [N/A:N/A] Measurements L x W x D (cm) [1:3.927] [N/A:N/A] A (cm) : rea [1:0.393] [N/A:N/A] Volume (cm) : [1:Full Thickness Without Exposed] [N/A:N/A] Classification: [1:Support Structures Medium] [N/A:N/A] Exudate Amount: [1:Serosanguineous] [N/A:N/A] Exudate Type: [1:red, brown] [N/A:N/A] Exudate Color: [1:None Present (0%)] [N/A:N/A] Granulation Amount: [1:Large (67-100%)] [N/A:N/A] Necrotic Amount: [1:Fat Layer (Subcutaneous Tissue): Yes N/A] Exposed Structures: [1:Fascia: No Tendon: No Muscle: No Joint: No Bone: No None]  [N/A:N/A] Treatment Notes Electronic Signature(s) Signed: 08/07/2020 5:38:15 PM By: Levan Hurst RN, BSN Signed: 08/07/2020 7:23:52 PM By: Linton Ham MD Entered By: Linton Ham on 08/07/2020 10:40:36 -------------------------------------------------------------------------------- Pain Assessment Details Patient Name: Date of Service: [place] Wendie Agreste, RO [place] 08/07/2020 9:00 A M Medical Record Number: [id number] Patient Account Number: [id number] Date of Birth/Sex: Treating RN: 02-01-1928 (86 y.o. Jerilynn Mages) Carlene Coria Primary Care Kordelia Severin: Ledora Bottcher Other Clinician: Referring Alekzander Cardell: Treating Roshawnda Pecora/Extender: Alyce Pagan, Kip Weeks in Treatment: 0 Active Problems Location of Pain Severity and Description of Pain Patient Has Paino No Site Locations Pain Management and Medication Current Pain  Management: Electronic Signature(s) Signed: 08/07/2020 5:24:04 PM By: Carlene Coria RN Entered By: Carlene Coria on 08/07/2020 10:21:35 -------------------------------------------------------------------------------- Patient/Caregiver Education Details Patient Name: Date of Service: Encarnacion Chu, [place] 12/20/2021andnbsp9:00 [place] Record Number: [id number] Patient Account Number: [id number] Date of Birth/Gender: Treating RN: Mar 13, 1928 (86 y.o. Janyth Contes Primary Care Physician: Ledora Bottcher Other Clinician: Referring Physician: Treating Physician/Extender: Annia Belt Weeks in Treatment: 0 Education Assessment Education Provided To: Patient Education Topics Provided Wound/Skin Impairment: Methods: Explain/Verbal Responses: State content correctly Electronic Signature(s) Signed: 08/07/2020 5:38:15 PM By: Levan Hurst RN, BSN Entered By: Levan Hurst on 08/07/2020 10:35:21 -------------------------------------------------------------------------------- Wound Assessment Details Patient Name: Date of Service: [place] RRETT, [place]  [place] 08/07/2020 9:00 A M Medical Record Number: [id number] Patient Account Number: [id number] Date of Birth/Sex: Treating RN: 10-25-1927 (86 y.o. Jerilynn Mages) Carlene Coria Primary Care Dedrick Heffner: Ledora Bottcher Other Clinician: Referring Edson Deridder: Treating Domonique Brouillard/Extender: Alyce Pagan, Kip Weeks in Treatment: 0 Wound Status Wound Number: 1 Primary Etiology: Atypical Wound Location: Right Lower Leg Wound Status: Open Wounding Event: Trauma Comorbid History: Arrhythmia, Hypertension, Type II Diabetes Date Acquired: 02/23/2020 Weeks Of Treatment: 0 Clustered Wound: No Wound Measurements Length: (cm) 2 Width: (cm) 2.5 Depth: (cm) 0.1 Area: (cm) 3.927 Volume: (cm) 0.393 % Reduction in Area: % Reduction in Volume: Epithelialization: None Tunneling: No Undermining: No Wound Description Classification: Full Thickness Without Exposed Support Structures Exudate Amount: Medium Exudate Type: Serosanguineous Exudate Color: red, brown Foul Odor After Cleansing: No Slough/Fibrino Yes Wound Bed Granulation Amount: None Present (0%) Exposed Structure Necrotic Amount: Large (67-100%) Fascia Exposed: No Necrotic Quality: Adherent Slough Fat Layer (Subcutaneous Tissue) Exposed: Yes Tendon Exposed: No Muscle Exposed: No Joint Exposed: No Bone Exposed: No Electronic Signature(s) Signed: 08/07/2020 5:24:04 PM By: Carlene Coria RN Entered By: Carlene Coria on 08/07/2020 10:21:03 -------------------------------------------------------------------------------- [place] Details Patient Name: Date of Service: [place] RRETT, [place] 08/07/2020 9:00 A M Medical Record Number: [id number] Patient Account Number: [id number] Date of Birth/Sex: Treating RN: Jul 12, 1928 (86 y.o. Jerilynn Mages) Carlene Coria Primary Care Joleen Stuckert: Ledora Bottcher Other Clinician: Referring Rimas Gilham: Treating Kenyen Candy/Extender: Alyce Pagan, Kip Weeks in Treatment: 0 Vital Signs Time Taken: 09:51 Temperature  (F): 98.2 Height (in): 71 Pulse (bpm): 65 Source: Stated Respiratory Rate (breaths/min): 18 Weight (lbs): 185 Blood Pressure (mmHg): 163/74 Body Mass Index (BMI): 25.8 Reference Range: 80 - 120 mg / dl Electronic Signature(s) Signed: 08/07/2020 5:24:04 PM By: Carlene Coria RN Entered By: Carlene Coria on 08/07/2020 09:53:00

## 2020-08-07 NOTE — Progress Notes (Signed)
Thomas Zavala ([id number]) , Visit Report for 08/07/2020 Abuse/Suicide Risk Screen Details Patient Name: Date of Service: [place], [place] WE 08/07/2020 9:00 [place] Record Number: [id number] Patient Account Number: [id number] Date of Birth/Sex: Treating RN: 04-17-1928 (86 y.o. Jerilynn Mages) Carlene Coria Primary Care Wallis Vancott: Ledora Bottcher Other Clinician: Referring Reita Shindler: Treating Diamonique Ruedas/Extender: Linton Ham Corrington, Kip Weeks in Treatment: 0 Abuse/Suicide Risk Screen Items Answer ABUSE RISK SCREEN: Has anyone close to you tried to hurt or harm you recentlyo No Do you feel uncomfortable with anyone in your familyo No Has anyone forced you do things that you didnt want to doo No Electronic Signature(s) Signed: 08/07/2020 5:24:04 PM By: Carlene Coria RN Entered By: Carlene Coria on 08/07/2020 10:00:07 -------------------------------------------------------------------------------- Activities of Daily Living Details Patient Name: Date of Service: [place], [place] WE 08/07/2020 9:00 [place] Record Number: [id number] Patient Account Number: [id number] Date of Birth/Sex: Treating RN: 28-Jul-1928 (86 y.o. Jerilynn Mages) Carlene Coria Primary Care Heaven Wandell: Ledora Bottcher Other Clinician: Referring Kiegan Macaraeg: Treating Andriana Casa/Extender: Alyce Pagan, Kip Weeks in Treatment: 0 Activities of Daily Living Items Answer Activities of Daily Living (Please select one for each item) Drive Automobile Completely Able T Medications ake Completely Able Use T elephone Completely Able Care for Appearance Completely Able Use T oilet Completely Able Bath / Shower Completely Able Dress Self Completely Able Feed Self Completely Able Walk Completely Able Get In / Out Bed Completely Able Housework Completely Able Prepare Meals Completely Able Handle Money Completely Able Shop for Self Completely Able Electronic Signature(s) Signed: 08/07/2020 5:24:04 PM By: Carlene Coria RN Entered By:  Carlene Coria on 08/07/2020 10:01:10 -------------------------------------------------------------------------------- Education Screening Details Patient Name: Date of Service: [place], [place] 08/07/2020 9:00 A [facility] Record Number: [id number] Patient Account Number: [id number] Date of Birth/Sex: Treating RN: October 07, 1927 (86 y.o. Jerilynn Mages) Carlene Coria Primary Care Jaymar Loeber: Ledora Bottcher Other Clinician: Referring Jessen Siegman: Treating Cyndee Giammarco/Extender: Alyce Pagan, Kip Weeks in Treatment: 0 Primary Learner Assessed: Patient Learning Preferences/Education Level/Primary Language Learning Preference: Explanation Highest Education Level: College or Above Preferred Language: English Cognitive Barrier Language Barrier: No Translator Needed: No Memory Deficit: No Emotional Barrier: No Cultural/Religious Beliefs Affecting Medical Care: No Physical Barrier Impaired Vision: No Impaired Hearing: No Decreased Hand dexterity: No Knowledge/Comprehension Knowledge Level: Medium Comprehension Level: High Ability to understand written instructions: High Ability to understand verbal instructions: High Motivation Anxiety Level: Anxious Cooperation: Cooperative Education Importance: Acknowledges Need Interest in Health Problems: Asks Questions Perception: Coherent Willingness to Engage in Self-Management High Activities: Readiness to Engage in Self-Management High Activities: Electronic Signature(s) Signed: 08/07/2020 5:24:04 PM By: Carlene Coria RN Entered By: Carlene Coria on 08/07/2020 10:02:08 -------------------------------------------------------------------------------- Fall Risk Assessment Details Patient Name: Date of Service: [place] 08/07/2020 9:00 [place] Number: [id number] Patient Account Number: [id number] Date of Birth/Sex: Treating RN: Mar 17, 1928 (86 y.o. Jerilynn Mages) Carlene Coria Primary Care Abbegale Stehle: Ledora Bottcher Other Clinician: Referring  Starsky Nanna: Treating Maythe Deramo/Extender: Alyce Pagan, Kip Weeks in Treatment: 0 Fall Risk Assessment Items Have you had 2 or more falls in the last 12 monthso 0 Yes Have you had any fall that resulted in injury in the last 12 monthso 0 Yes FALLS RISK SCREEN History of falling - immediate or within 3 months 0 No Secondary diagnosis (Do you have 2 or more medical diagnoseso) 0 No Ambulatory aid None/bed rest/wheelchair/nurse 0 No Crutches/cane/walker 0 No Furniture 0 No Intravenous therapy Access/Saline/Heparin Lock 0 No Gait/Transferring Normal/ bed rest/ wheelchair 0 No Weak (short steps with or  without shuffle, stooped but able to lift head while walking, may seek 0 No support from furniture) Impaired (short steps with shuffle, may have difficulty arising from chair, head down, impaired 0 No balance) Mental Status Oriented to own ability 0 No Electronic Signature(s) Signed: 08/07/2020 5:24:04 PM By: Carlene Coria RN Entered By: Carlene Coria on 08/07/2020 10:03:02 -------------------------------------------------------------------------------- Foot Assessment Details Patient Name: Date of Service: [place] 08/07/2020 9:00 [place] Record Number: [id number] Patient Account Number: [id number] Date of Birth/Sex: Treating RN: Apr 16, 1928 (86 y.o. Jerilynn Mages) Carlene Coria Primary Care Henrik Orihuela: Ledora Bottcher Other Clinician: Referring Yared Barefoot: Treating Ilianna Bown/Extender: Alyce Pagan, Kip Weeks in Treatment: 0 Foot Assessment Items Site Locations + = Sensation present, - = Sensation absent, C = Callus, U = Ulcer R = Redness, W = Warmth, M = Maceration, PU = Pre-ulcerative lesion F = Fissure, S = Swelling, D = Dryness Assessment Right: Left: Other Deformity: No No Prior Foot Ulcer: No No Prior Amputation: No No Charcot Joint: No No Ambulatory Status: Ambulatory Without Help Gait: Steady Electronic Signature(s) Signed: 08/07/2020 5:24:04 PM  By: Carlene Coria RN Entered By: Carlene Coria on 08/07/2020 10:08:13 -------------------------------------------------------------------------------- Nutrition Risk Screening Details Patient Name: Date of Service: [place], [place] 08/07/2020 9:00 [place] Number: [id number] Patient Account Number: [id number] Date of Birth/Sex: Treating RN: 1928/07/10 (86 y.o. Jerilynn Mages) Carlene Coria Primary Care Blayton Huttner: Ledora Bottcher Other Clinician: Referring Clotilde Loth: Treating Kaiya Boatman/Extender: Linton Ham Corrington, Kip Weeks in Treatment: 0 Height (in): 71 Weight (lbs): 185 Body Mass Index (BMI): 25.8 Nutrition Risk Screening Items Score Screening NUTRITION RISK SCREEN: I have an illness or condition that made me change the kind and/or amount of food I eat 0 No I eat fewer than two meals per day 0 No I eat few fruits and vegetables, or milk products 0 No I have three or more drinks of beer, liquor or wine almost every day 0 No I have tooth or mouth problems that make it hard for me to eat 0 No I don't always have enough money to buy the food I need 0 No I eat alone most of the time 0 No I take three or more different prescribed or over-the-counter drugs a day 1 Yes Without wanting to, I have lost or gained 10 pounds in the last six months 0 No I am not always physically able to shop, cook and/or feed myself 0 No Nutrition Protocols Good Risk Protocol 0 No interventions needed Moderate Risk Protocol High Risk Proctocol Risk Level: Good Risk Score: 1 Electronic Signature(s) Signed: 08/07/2020 5:24:04 PM By: Carlene Coria RN Entered By: Carlene Coria on 08/07/2020 10:03:23

## 2020-08-07 NOTE — Telephone Encounter (Signed)
Patient needs f/u appt (per 8/27 LOS). Patient missed last appt because he didn't know about it.  Schedule message sent

## 2020-08-07 NOTE — Progress Notes (Signed)
Thomas Zavala ([id number]) , Visit Report for 08/07/2020 Chief Complaint Document Details Patient Name: Date of Service: Encarnacion Chu, [place] WE 08/07/2020 9:00 [place] Record Number: [id number] Patient Account Number: [id number] Date of Birth/Sex: Treating RN: December 01, 1927 (86 y.o. Janyth Contes Primary Care Provider: Ledora Bottcher Other Clinician: Referring Provider: Treating Provider/Extender: Alyce Pagan, Kip Weeks in Treatment: 0 Information Obtained from: Patient Chief Complaint 07/2520; patient is here for review open area on the right anterior lower leg Electronic Signature(s) Signed: 08/07/2020 7:23:52 PM By: Linton Ham MD Entered By: Linton Ham on 08/07/2020 10:41:09 -------------------------------------------------------------------------------- HPI Details Patient Name: Date of Service: [place] Wendie Agreste, [place] WE 08/07/2020 9:00 A M Medical Record Number: [id number] Patient Account Number: [id number] Date of Birth/Sex: Treating RN: 1928-02-22 (86 y.o. Janyth Contes Primary Care Provider: Ledora Bottcher Other Clinician: Referring Provider: Treating Provider/Extender: Alyce Pagan, Kip Weeks in Treatment: 0 History of Present Illness HPI Description: ADMISSION 08/07/2020 This is a 86 year old man with an extensive history of skin malignancies including excision of a melanoma earlier this year on 01/05/2020 on his forehead with a split thickness skin graft by Dr. Marla Roe. He went on to have a radical neck dissection parotidectomy. With regards to the area we are seeing today he tells [place] that he fell with an abrasion on the right anterior lower leg in July. Since then this area has closed and reopened and closed and reopened several times. They do not have any pictures to show me. Currently the area is not copen however it has a scaled edges around it they have been applying topical antibiotics and a Band-Aid. He also shows me a corn the  area on the right lateral ankle and a obvious malignancy on his right dorsal forearm. Paradoxically he actually has an appointment with his dermatologist right after this clinic appointment. 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 Janyth Contes Primary Care Provider: Ledora Bottcher Other Clinician: Referring Provider: Treating Provider/Extender: Alyce Pagan, Kip Weeks in Treatment: 0 Constitutional Patient is hypertensive.. Pulse regular and within target range for patient.[name] Respirations regular, non-labored and within target range.. Temperature is normal and within the target range for the patient.[name] Appears in no distress. Cardiovascular Pedal pulses present.. 2+ pitting edema in the right leg. Notes Wound exam; the area that they came to see about is on the right anterior tibia. There is nothing open here however they are crusted callused areas around this.. The area is generally erythematous but nontender. On the right lateral lower leg just above the ankle there is a horned area present He showed [place] a raised bleeding nodule area on his forearm. This is undoubtedly malignant Electronic Signature(s) Signed: 08/07/2020 7:23:52 PM By: Linton Ham MD Entered By: Linton Ham on 08/07/2020 10:48:01 -------------------------------------------------------------------------------- Physician Orders  Details Patient Name: Date of Service: [place] RRETT, [place] WE 08/07/2020 9:00 [place] Record Number: [id number] Patient Account Number: [id number] Date of Birth/Sex: Treating RN: 02-Apr-1928 (86 y.o. Janyth Contes Primary Care Provider: Ledora Bottcher Other Clinician: Referring Provider: Treating Provider/Extender: Linton Ham [place], Kip Weeks in  Treatment: 0 Verbal / Phone Orders: No Diagnosis Coding Discharge From [facility] Services Discharge from [place]. Electronic Signature(s) Signed: 08/07/2020 5:38:15 PM By: Levan Hurst RN, BSN Signed: 08/07/2020 7:23:52 PM By: Linton Ham MD Entered By: Levan Hurst on 08/07/2020 10:33:52 -------------------------------------------------------------------------------- Problem List Details Patient Name: Date of Service: [place] RRETT, [place] WE 08/07/2020 9:00 [place] Record Number: [id number] Patient Account Number: [id number] Date of Birth/Sex: Treating RN: 1927-12-17 (86 y.o. Janyth Contes Primary Care Provider: Ledora Bottcher Other Clinician: Referring Provider: Treating Provider/Extender: Alyce Pagan, Kip Weeks in Treatment: 0 Active Problems ICD-10 Encounter Code Description Active Date MDM Diagnosis L97.811 Non-pressure chronic ulcer of other part of right lower leg limited to breakdown 08/07/2020 No Yes of skin Inactive Problems Resolved Problems Electronic Signature(s) Signed: 08/07/2020 7:23:52 PM By: Linton Ham MD Entered By: Linton Ham on 08/07/2020 10:39:49 -------------------------------------------------------------------------------- Progress Note Details Patient Name: Date of Service: [place], [place] WE 08/07/2020 9:00 A M Medical Record Number: [id number] Patient Account Number: [id number] Date of Birth/Sex: Treating RN: 08-29-1927 (85 y.o. Janyth Contes Primary Care Provider: Ledora Bottcher  Other Clinician: Referring Provider: Treating Provider/Extender: Alyce Pagan, Kip Weeks in Treatment: 0 Subjective Chief Complaint Information obtained from Patient 07/2520; patient is here for review open area on the right anterior lower leg History of Present Illness (HPI) ADMISSION 08/07/2020 This is a 86 year old man with an extensive history of skin malignancies including excision of a melanoma earlier this year on 01/05/2020 on his forehead with a split thickness skin graft by Dr. Marla Roe. He went on to have a radical neck dissection parotidectomy. With regards to the area we are seeing today he tells [place] that he fell with an abrasion on the right anterior lower leg in July. Since then this area has closed and reopened and closed and reopened several times. They do not have any pictures to show me. Currently the area is not copen however it has a scaled edges around it they have been applying topical antibiotics and a Band-Aid. He also shows me a corn the area on the right lateral ankle and a obvious malignancy on his right dorsal forearm. Paradoxically he actually has an appointment with his dermatologist right after this clinic appointment. 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 Allergies fluorouracil Family History Hypertension - Siblings,Father, Kidney Disease - Siblings, Stroke - Siblings, No family history of Cancer, Diabetes, Heart Disease, Hereditary Spherocytosis, Lung Disease, Seizures, Thyroid Problems, Tuberculosis. Social History Former smoker, Marital Status - Widowed, Alcohol Use - Daily, Drug Use - No History, Caffeine Use - Daily. Medical History Eyes Denies history of Cataracts, Glaucoma, Optic Neuritis Ear/Nose/Mouth/Throat Denies history of  Chronic sinus problems/congestion, Middle ear problems Hematologic/Lymphatic Denies history of Anemia, Hemophilia, Human Immunodeficiency Virus, Lymphedema, Sickle Cell Disease Respiratory Denies history of Aspiration, Asthma, Chronic Obstructive Pulmonary Disease (COPD), Pneumothorax, Sleep Apnea, Tuberculosis Cardiovascular Patient has history of Arrhythmia - PVC, Hypertension Denies history of Angina, Congestive Heart Failure, Coronary Artery Disease, Deep Vein Thrombosis, Hypotension, Myocardial Infarction, Peripheral Arterial Disease, Peripheral Venous Disease, Phlebitis, Vasculitis Gastrointestinal Denies history of Cirrhosis , Colitis, Crohnoos, Hepatitis A, Hepatitis B, Hepatitis C Endocrine Patient has history of Type II Diabetes Denies history of Type I Diabetes Genitourinary Denies history of End Stage  Renal Disease Immunological Denies history of Lupus Erythematosus, Raynaudoos, Scleroderma Integumentary (Skin) Denies history of History of Burn Musculoskeletal Denies history of Gout, Rheumatoid Arthritis, Osteoarthritis, Osteomyelitis Neurologic Denies history of Dementia, Neuropathy, Quadriplegia, Paraplegia, Seizure Disorder Oncologic Denies history of Received Chemotherapy, Received Radiation Psychiatric Denies history of Anorexia/bulimia, Confinement Anxiety Patient is treated with Oral Agents. Blood sugar is not tested. Review of Systems (ROS) Constitutional Symptoms (General Health) Denies complaints or symptoms of Fatigue, Fever, Chills, Marked Weight Change. Eyes Denies complaints or symptoms of Dry Eyes, Vision Changes, Glasses / Contacts. Ear/Nose/Mouth/Throat Denies complaints or symptoms of Chronic sinus problems or rhinitis. Respiratory Denies complaints or symptoms of Chronic or frequent coughs, Shortness of Breath. Cardiovascular Denies complaints or symptoms of Chest pain. Gastrointestinal Denies complaints or symptoms of Frequent diarrhea,  Nausea, Vomiting. Endocrine Denies complaints or symptoms of Heat/cold intolerance. Genitourinary Denies complaints or symptoms of Frequent urination. Integumentary (Skin) Complains or has symptoms of Wounds. Musculoskeletal Denies complaints or symptoms of Muscle Pain, Muscle Weakness. Neurologic Denies complaints or symptoms of Numbness/parasthesias. Oncologic skin ca Psychiatric Denies complaints or symptoms of Claustrophobia, Suicidal. Objective Constitutional Patient is hypertensive.. Pulse regular and within target range for patient.[name] Respirations regular, non-labored and within target range.. Temperature is normal and within the target range for the patient.[name] Appears in no distress. Vitals Time Taken: 9:51 AM, Height: 71 in, Source: Stated, Weight: 185 lbs, BMI: 25.8, Temperature: 98.2 F, Pulse: 65 bpm, Respiratory Rate: 18 breaths/min, Blood Pressure: 163/74 mmHg. Cardiovascular Pedal pulses present.. 2+ pitting edema in the right leg. General Notes: Wound exam; the area that they came to see about is on the right anterior tibia. There is nothing open here however they are crusted callused areas around this.. The area is generally erythematous but nontender. ooOn the right lateral lower leg just above the ankle there is a horned area present [facility] showed [place] a raised bleeding nodule area on his forearm. This is undoubtedly malignant Integumentary (Hair, Skin) Wound #1 status is Open. Original cause of wound was Trauma. The wound is located on the Right,Anterior Lower Leg. The wound measures 2cm length x 2.5cm width x 0.1cm depth; 3.927cm^2 area and 0.393cm^3 volume. There is Fat Layer (Subcutaneous Tissue) exposed. There is no tunneling or undermining noted. There is a medium amount of serosanguineous drainage noted. There is no granulation within the wound bed. There is a large (67-100%) amount of necrotic tissue within the wound bed including Adherent  Slough. Assessment Active Problems ICD-10 Non-pressure chronic ulcer of other part of right lower leg limited to breakdown of skin Plan Discharge From [facility] Services: Discharge from [place]. They came to see about is not open. Nevertheless there is irritation associated with this with erythema. Apparently this is open and closed open and closed. With this history I wonder about an underlying malignancy. This also has an appearance of a tinea infection. Certainly does not look like an acute bacterial issue. I do not think he needs to be followed here and he is seeing his dermatologist right after he leaves this clinic. ooHe has a horned area on the right lateral lower leg just above the ankle. The skin sometimes harbor squamous cell carcinomas I have asked them to show this to the dermatologist as well. The area on his right dorsal forearm is no doubt malignant possibly squamous cell ooNo need for him to be followed here. They are free to call us  if they need [place] to look at this again Electronic Signature(s) Signed: 08/07/2020 7:23:52 PM By: Linton Ham MD Entered By: Linton Ham on 08/07/2020 10:49:44 -------------------------------------------------------------------------------- HxROS Details Patient Name: Date of Service: [place] RRETT, [place] 08/07/2020 9:00 [place] Record Number: [id number] Patient Account Number: [id number] Date of Birth/Sex: Treating RN: 03/30/1928 (86 y.o. Jerilynn Mages) Carlene Coria Primary Care Provider: Ledora Bottcher Other Clinician: Referring Provider: Treating Provider/Extender: Alyce Pagan, Kip Weeks in Treatment: 0 Information Obtained From Patient Constitutional Symptoms (General Health) Complaints and Symptoms: Negative for: Fatigue; Fever; Chills; Marked Weight Change Eyes Complaints and Symptoms: Negative for: Dry Eyes; Vision Changes; Glasses /  Contacts Medical History: Negative for: Cataracts; Glaucoma; Optic Neuritis Ear/Nose/Mouth/Throat Complaints and Symptoms: Negative for: Chronic sinus problems or rhinitis Medical History: Negative for: Chronic sinus problems/congestion; Middle ear problems Respiratory Complaints and Symptoms: Negative for: Chronic or frequent coughs; Shortness of Breath Medical History: Negative for: Aspiration; Asthma; Chronic Obstructive Pulmonary Disease (COPD); Pneumothorax; Sleep Apnea; Tuberculosis Cardiovascular Complaints and Symptoms: Negative for: Chest pain Medical History: Positive for: Arrhythmia - PVC; Hypertension Negative for: Angina; Congestive Heart Failure; Coronary Artery Disease; Deep Vein Thrombosis; Hypotension; Myocardial Infarction; Peripheral Arterial Disease; Peripheral Venous Disease; Phlebitis; Vasculitis Gastrointestinal Complaints and Symptoms: Negative for: Frequent diarrhea; Nausea; Vomiting Medical History: Negative for: Cirrhosis ; Colitis; Crohns; Hepatitis A; Hepatitis B; Hepatitis C Endocrine Complaints and Symptoms: Negative for: Heat/cold intolerance Medical History: Positive for: Type II Diabetes Negative for: Type I Diabetes Time with diabetes: 45 Treated with: Oral agents Blood sugar tested every day: No Genitourinary Complaints and Symptoms: Negative for: Frequent urination Medical History: Negative for: End Stage Renal Disease Integumentary (Skin) Complaints and Symptoms: Positive for: Wounds Medical History: Negative for: History of Burn Musculoskeletal Complaints and Symptoms: Negative for: Muscle Pain; Muscle Weakness Medical History: Negative for: Gout; Rheumatoid Arthritis; Osteoarthritis; Osteomyelitis Neurologic Complaints and Symptoms: Negative for: Numbness/parasthesias Medical History: Negative for: Dementia; Neuropathy; Quadriplegia; Paraplegia; Seizure Disorder Psychiatric Complaints and Symptoms: Negative for:  Claustrophobia; Suicidal Medical History: Negative for: Anorexia/bulimia; Confinement Anxiety Hematologic/Lymphatic Medical History: Negative for: Anemia; Hemophilia; Human Immunodeficiency Virus; Lymphedema; Sickle Cell Disease Immunological Medical History: Negative for: Lupus Erythematosus; Raynauds; Scleroderma Oncologic Complaints and Symptoms: Review of System Notes: skin ca Medical History: Negative for: Received Chemotherapy; Received Radiation Immunizations Pneumococcal Vaccine: Received Pneumococcal Vaccination: Yes Implantable Devices None Family and Social History Cancer: No; Diabetes: No; Heart Disease: No; Hereditary Spherocytosis: No; Hypertension: Yes - Siblings,Father; Kidney Disease: Yes - Siblings; Lung Disease: No; Seizures: No; Stroke: Yes - Siblings; Thyroid Problems: No; Tuberculosis: No; Former smoker; Marital Status - Widowed; Alcohol Use: Daily; Drug Use: No History; Caffeine Use: Daily; Financial Concerns: No; Food, Clothing or Shelter Needs: No; Support System Lacking: No; Transportation Concerns: No Electronic Signature(s) Signed: 08/07/2020 5:24:04 PM By: Carlene Coria RN Signed: 08/07/2020 7:23:52 PM By: Linton Ham MD Entered By: Carlene Coria on 08/07/2020 09:59:48 -------------------------------------------------------------------------------- SuperBill Details Patient Name: Date of Service: [place], [place] 08/07/2020 Medical Record Number: [id number] Patient Account Number: [id number] Date of Birth/Sex: Treating RN: March 12, 1928 (86 y.o. Janyth Contes Primary Care Provider: Ledora Bottcher Other Clinician: Referring Provider: Treating Provider/Extender: Alyce Pagan, Kip Weeks in Treatment: 0 Diagnosis Coding ICD-10 Codes Code Description [phone] Non-pressure chronic ulcer of other part of right lower leg limited to breakdown of skin Facility Procedures CPT4 Code: [id number] Description: 99213 - WOUND CARE VISIT-LEV 3  EST PT Modifier: Quantity: 1 Physician Procedures : CPT4 Code Description Modifier [id number] - WC PHYS LEVEL 2 -  NEW PT ICD-10 Diagnosis Description L97.811 Non-pressure chronic ulcer of other part of right lower leg limited to breakdown of skin Quantity: 1 Electronic Signature(s) Signed: 08/07/2020 5:38:15 PM By: Levan Hurst RN, BSN Signed: 08/07/2020 7:23:52 PM By: Linton Ham MD Entered By: Levan Hurst on 08/07/2020 12:57:57

## 2020-08-08 ENCOUNTER — Telehealth: Payer: Self-pay | Admitting: Hematology

## 2020-08-08 NOTE — Telephone Encounter (Signed)
Called pt per 12/20 sch msg - no answer. Left message for patient to call back to reschedule missed appt.   

## 2020-08-15 ENCOUNTER — Telehealth: Payer: Self-pay | Admitting: Hematology

## 2020-08-15 NOTE — Telephone Encounter (Signed)
Called pt per 12/28 sch msg - no answer . Left message for patient to call back to schedule appt.   

## 2020-08-16 ENCOUNTER — Telehealth: Payer: Self-pay | Admitting: Hematology

## 2020-08-16 NOTE — Telephone Encounter (Signed)
Rescheduled per pt request. Pt and daughter called in to reschedule missed appts, also corrected pt phone number. Confirmed 1/13 appts

## 2020-08-22 ENCOUNTER — Encounter (HOSPITAL_BASED_OUTPATIENT_CLINIC_OR_DEPARTMENT_OTHER): Payer: PPO | Admitting: Internal Medicine

## 2020-08-29 DIAGNOSIS — C4442 Squamous cell carcinoma of skin of scalp and neck: Secondary | ICD-10-CM | POA: Diagnosis not present

## 2020-08-29 DIAGNOSIS — X32XXXD Exposure to sunlight, subsequent encounter: Secondary | ICD-10-CM | POA: Diagnosis not present

## 2020-08-29 DIAGNOSIS — D0472 Carcinoma in situ of skin of left lower limb, including hip: Secondary | ICD-10-CM | POA: Diagnosis not present

## 2020-08-29 DIAGNOSIS — L570 Actinic keratosis: Secondary | ICD-10-CM | POA: Diagnosis not present

## 2020-08-31 ENCOUNTER — Inpatient Hospital Stay (HOSPITAL_BASED_OUTPATIENT_CLINIC_OR_DEPARTMENT_OTHER): Payer: PPO | Admitting: Hematology

## 2020-08-31 ENCOUNTER — Other Ambulatory Visit: Payer: Self-pay

## 2020-08-31 ENCOUNTER — Inpatient Hospital Stay: Payer: PPO | Attending: Family Medicine

## 2020-08-31 VITALS — BP 174/63 | HR 69 | Temp 97.4°F | Resp 18 | Ht 71.0 in | Wt 186.7 lb

## 2020-08-31 DIAGNOSIS — C9110 Chronic lymphocytic leukemia of B-cell type not having achieved remission: Secondary | ICD-10-CM | POA: Insufficient documentation

## 2020-08-31 DIAGNOSIS — C4330 Malignant melanoma of unspecified part of face: Secondary | ICD-10-CM | POA: Diagnosis not present

## 2020-08-31 DIAGNOSIS — Z87891 Personal history of nicotine dependence: Secondary | ICD-10-CM | POA: Diagnosis not present

## 2020-08-31 DIAGNOSIS — C434 Malignant melanoma of scalp and neck: Secondary | ICD-10-CM | POA: Insufficient documentation

## 2020-08-31 DIAGNOSIS — D696 Thrombocytopenia, unspecified: Secondary | ICD-10-CM

## 2020-08-31 LAB — CMP (CANCER CENTER ONLY)
ALT: 11 U/L (ref 0–44)
AST: 18 U/L (ref 15–41)
Albumin: 3.8 g/dL (ref 3.5–5.0)
Alkaline Phosphatase: 107 U/L (ref 38–126)
Anion gap: 10 (ref 5–15)
BUN: 11 mg/dL (ref 8–23)
CO2: 31 mmol/L (ref 22–32)
Calcium: 9.1 mg/dL (ref 8.9–10.3)
Chloride: 92 mmol/L — ABNORMAL LOW (ref 98–111)
Creatinine: 1.13 mg/dL (ref 0.61–1.24)
GFR, Estimated: >60 mL/min (ref >60)
Glucose, Bld: 139 mg/dL — ABNORMAL HIGH (ref 70–99)
Potassium: 3.3 mmol/L — ABNORMAL LOW (ref 3.5–5.1)
Sodium: 133 mmol/L — ABNORMAL LOW (ref 135–145)
Total Bilirubin: 1 mg/dL (ref 0.3–1.2)
Total Protein: 6.2 g/dL — ABNORMAL LOW (ref 6.5–8.1)

## 2020-08-31 LAB — CBC WITH DIFFERENTIAL/PLATELET
Abs Immature Granulocytes: 0.12 10*3/uL — ABNORMAL HIGH (ref 0.00–0.07)
Basophils Absolute: 0.1 10*3/uL (ref 0.0–0.1)
Basophils Relative: 0 %
Eosinophils Absolute: 0.2 10*3/uL (ref 0.0–0.5)
Eosinophils Relative: 0 %
HCT: 40.7 % (ref 39.0–52.0)
Hemoglobin: 13.6 g/dL (ref 13.0–17.0)
Immature Granulocytes: 0 %
Lymphocytes Relative: 90 %
Lymphs Abs: 83.3 10*3/uL — ABNORMAL HIGH (ref 0.7–4.0)
MCH: 32.9 pg (ref 26.0–34.0)
MCHC: 33.4 g/dL (ref 30.0–36.0)
MCV: 98.3 fL (ref 80.0–100.0)
Monocytes Absolute: 5.1 10*3/uL — ABNORMAL HIGH (ref 0.1–1.0)
Monocytes Relative: 6 %
Neutro Abs: 3.7 10*3/uL (ref 1.7–7.7)
Neutrophils Relative %: 4 %
Platelets: 69 10*3/uL — ABNORMAL LOW (ref 150–400)
RBC: 4.14 MIL/uL — ABNORMAL LOW (ref 4.22–5.81)
RDW: 14.2 % (ref 11.5–15.5)
Smear Review: DECREASED
WBC: 92.5 10*3/uL (ref 4.0–10.5)
nRBC: 0 % (ref 0.0–0.2)

## 2020-08-31 LAB — LACTATE DEHYDROGENASE: LDH: 137 U/L (ref 98–192)

## 2020-08-31 NOTE — Progress Notes (Signed)
HEMATOLOGY/ONCOLOGY CLINIC  Date of Service: 08/31/2020  Patient Care Team: Corrington, Delsa Grana, MD as PCP - General (Family Medicine)  CHIEF COMPLAINTS/PURPOSE OF CONSULTATION:  Chronic Lymphocytic Leukemia Recently diagnosed Melanoma  HISTORY OF PRESENTING ILLNESS:   Thomas Zavala is a wonderful 87 y.o. male who has been referred to [place] by Bradly Bienenstock, NP for evaluation and management of his Chronic Lymphocytic Leukemia. The patient has been seen by Dr. Murriel Hopper in the past. He is accompanied today by his daughter. The pt reports that he is doing well overall.   The pt reports that he had ease of bruising in 2017 at which time he sought out a physical and was observed to have lymphocytosis and was diagnosed with CLL in November 2017. The pt denies having any other symptoms at that time. The pt has not had treatment for his CLL.  The pt notes that he has had some sweating at night between his legs, and feelings of flushness, which he notes started "in the last week or so." The pt denies fevers, chills, and noticing any new lumps or bumps. He has had recurrent squamous cell carcinomas for "several years," in sun-exposed areas. He follows with his dermatologist regularly for skin screenings.  The pt notes that his energy levels have diminished somewhat over the last 3 years, but denies fatigue being a limiting factor. He recently went on a 2 mile hike with his daughter. He has a one level living arrangement. The pt denies frequent or recurrent infections.   The pt notes that he follows with Dr. Gloriann Loan in urology for occasional hematuria. He notes that his DM and blood pressure have been well controlled.   The pt notes that he has had both of his pneumonia vaccines in the last 5 years and received his flu shot this season.  Most recent lab results (10/21/18) of CBC w/diff is as follows: all values are WNL except for WBC at WBC at 51.5k, PLT at 73k, Lymphs abs at 46.5k, Monocytes abs at  1.9k. 10/21/18 CMP is pending 10/21/18 LDH is pending  On review of systems, pt reports staying active, eating well, mildly lower energy levels, and denies drenching night sweats, fevers, new fatigue, chills, unexpected weight loss, frequent infections, recurrent infections, leg swelling, abdominal pains, and any other symptoms.  On PMHx the pt reports CLL, DM type II.   INTERVAL HISTORY:   Thomas Zavala returns today for management and evaluation of his CLL. The patient's last visit with [place] was on 04/14/2020. The pt reports that he is doing well overall.  The pt reports no fevers/chills/night sweats. New hyperpigmented lesion on leg-- was recommended to f/u with dermatology to evaluate this.  Lab results today (08/31/20) of CBC w/diff and CMP is as follows: wbc counts increased to 92.5k with thrombocytopenia @ 69k 08/31/2020 LDH at 137  On review of systems, pt reports no fevers/chills/night sweats/unexpected weight loss  MEDICAL HISTORY:  Past Medical History:  Diagnosis Date  . Abdominal hernia   . BPH (benign prostatic hyperplasia) 07/25/2016  . CLL (chronic lymphocytic leukemia) ([place])   . Hypertension   . Melanoma of forehead ([place])   . Melanoma of scalp ([place])   . PVC's (premature ventricular contractions)   . Type 2 diabetes mellitus without complication, without long-term current use of insulin ([place]) 07/25/2016    SURGICAL HISTORY: Past Surgical History:  Procedure Laterality Date  . EYE SURGERY     eye lids  . IRRIGATION AND DEBRIDEMENT  OF WOUND WITH SPLIT THICKNESS SKIN GRAFT Left 01/05/2020   Procedure: SPLIT THICKNESS SKIN GRAFT Left shoulder;  Surgeon: Wallace Going, DO;  Location: [facility];  Service: Plastics;  Laterality: Left;  [name] MASS EXCISION N/A 01/05/2020   Procedure: EXCISION OF FOREHEAD  MELANOMA;  Surgeon: Wallace Going, DO;  Location: [facility];  Service: Plastics;  Laterality: N/A;  . PAROTIDECTOMY Left 01/05/2020   Procedure: PAROTIDECTOMY;  Surgeon:  Izora Gala, MD;  Location: [facility];  Service: ENT;  Laterality: Left;  . PROSTATE SURGERY    . SENTINEL NODE BIOPSY Left 01/05/2020   Procedure: Sentinel Node Biopsy;  Surgeon: Izora Gala, MD;  Location: [facility];  Service: ENT;  Laterality: Left;    SOCIAL HISTORY: Social History   Socioeconomic History  . Marital status: Widowed    Spouse name: Not on file  . Number of children: 3  . Years of education: Not on file  . Highest education level: Not on file  Occupational History  . Not on file  Tobacco Use  . Smoking status: Former Smoker    Packs/day: 1.00    Years: 25.00    Pack years: 25.00    Types: Cigarettes    Quit date: 1970    Years since quitting: 52.0  . Smokeless tobacco: Never Used  . Tobacco comment: quit in the 70's  Vaping Use  . Vaping Use: Never used  Substance and Sexual Activity  . Alcohol use: Yes    Comment: socially.  . Drug use: No  . Sexual activity: Not on file  Other Topics Concern  . Not on file  Social History Narrative  . Not on file   Social Determinants of Health   Financial Resource Strain: Not on file  Food Insecurity: Not on file  Transportation Needs: Not on file  Physical Activity: Not on file  Stress: Not on file  Social Connections: Not on file  Intimate Partner Violence: Not on file    FAMILY HISTORY: Family History  Problem Relation Age of Onset  . Hypertension Father     ALLERGIES:  is allergic to fluorouracil.  MEDICATIONS:  Current Outpatient Medications  Medication Sig Dispense Refill  . Carboxymethylcellul-Glycerin (LUBRICATING EYE DROPS OP) Place 1 drop into both eyes daily as needed (dry eyes).    . carboxymethylcellul-glycerin (REFRESH OPTIVE) 0.5-0.9 % ophthalmic solution Apply to eye.    . finasteride (PROSCAR) 5 MG tablet Take 5 mg by mouth daily.    [name] FLUZONE HIGH-DOSE QUADRIVALENT 0.7 ML SUSY     . glucose blood (ONETOUCH ULTRA) test strip OneTouch Ultra Blue Test Strip  USE TO TEST BLOOD SUGAR DAILY  AS INSTRUCTED    . losartan-hydrochlorothiazide (HYZAAR) 100-25 MG tablet Take 1 tablet by mouth daily.     . metFORMIN (GLUCOPHAGE-XR) 500 MG 24 hr tablet Take 500 mg by mouth daily.     . metoprolol tartrate (LOPRESSOR) 50 MG tablet Take 1 tablet (50 mg total) by mouth 3 (three) times daily. 270 tablet 3  . mupirocin ointment (BACTROBAN) 2 % Apply topically 2 (two) times daily.    [name] neomycin-polymyxin b-dexamethasone (MAXITROL) 3.5-10000-0.1 OINT SMARTSIG:Sparingly In Eye(s) Twice Daily    . pravastatin (PRAVACHOL) 40 MG tablet Take 40 mg by mouth daily.     . tamsulosin (FLOMAX) 0.4 MG CAPS capsule Take 0.4 mg by mouth daily.     No current facility-administered medications for this visit.    REVIEW OF SYSTEMS:   A 10+ POINT REVIEW  OF SYSTEMS WAS OBTAINED including neurology, dermatology, psychiatry, cardiac, respiratory, lymph, extremities, GI, GU, Musculoskeletal, constitutional, breasts, reproductive, HEENT.  All pertinent positives are noted in the HPI.  All others are negative.   PHYSICAL EXAMINATION: ECOG PERFORMANCE STATUS: 2 - Symptomatic, <50% confined to bed  . There were no vitals filed for this visit. There were no vitals filed for this visit. There is no height or weight on file to calculate BMI.   NAD GENERAL:alert, in no acute distress and comfortable SKIN: no acute rashes, no significant lesions EYES: conjunctiva are pink and non-injected, sclera anicteric OROPHARYNX: MMM, no exudates, no oropharyngeal erythema or ulceration NECK: supple, no JVD LYMPH:  no palpable lymphadenopathy in the cervical, axillary or inguinal regions LUNGS: clear to auscultation b/l with normal respiratory effort HEART: regular rate & rhythm ABDOMEN:  normoactive bowel sounds , non tender, not distended. No palpable hepatosplenomegaly.  Extremity: no pedal edema PSYCH: alert & oriented x 3 with fluent speech NEURO: no focal motor/sensory deficits  LABORATORY DATA:  I have reviewed the  data as listed  . CBC Latest Ref Rng & Units 04/14/2020 02/11/2020 01/05/2020  WBC 4.0 - 10.5 K/uL 64.5(HH) 49.4(H) 80.9(HH)  Hemoglobin 13.0 - 17.0 g/dL 13.8 14.0 16.2  Hematocrit 39.0 - 52.0 % 41.4 41.4 48.2  Platelets 150 - 400 K/uL 76(L) 64(L) 100(L)    . CMP Latest Ref Rng & Units 04/14/2020 02/11/2020 01/05/2020  Glucose 70 - 99 mg/dL 152(H) 197(H) 146(H)  BUN 8 - 23 mg/dL 12 9 9   Creatinine 0.61 - 1.24 mg/dL 1.12 1.12 1.06  Sodium 135 - 145 mmol/L 131(L) 129(L) 127(L)  Potassium 3.5 - 5.1 mmol/L 3.6 3.5 5.2(H)  Chloride 98 - 111 mmol/L 94(L) 91(L) 89(L)  CO2 22 - 32 mmol/L 28 28 24   Calcium 8.9 - 10.3 mg/dL 9.6 9.0 9.1  Total Protein 6.5 - 8.1 g/dL 6.1(L) 5.7(L) -  Total Bilirubin 0.3 - 1.2 mg/dL 1.0 1.3(H) -  Alkaline Phos 38 - 126 U/L 89 98 -  AST 15 - 41 U/L 18 17 -  ALT 0 - 44 U/L 8 11 -    07/15/16 Cytogenetics and Flow Cytometry:   11/12/19 of Molecular Pathology   01/05/20 of Surgical Pathology Report [id number])  RADIOGRAPHIC STUDIES: I have personally reviewed the radiological images as listed and agreed with the findings in the report. No results found.   ASSESSMENT & PLAN:  87 y.o. male with  1. Chronic Lymphocytic Leukemia Diagnosed in November 2017 with flow cytometry 07/15/16 Cytogenetics revealed Trisomy 12  09/03/16 CT A/P revealed Borderline splenomegaly with 1.3 cm low-attenuation lesion in the anterior aspect of spleen. Mild upper abdominal and right iliac lymphadenopathy. 4 mm indeterminate pulmonary nodule in right lung base. Recommend continued attention on follow-up CT. Incidentally noted benign right hepatic lobe hemangioma, mildly and enlarged prostate, and colonic diverticulosis  10/10/17 CT Chest revealed Scattered small bilateral pulmonary nodules, including two dominant right middle lobe nodules measuring 5-6 mm, unchanged, likely benign. If clinically warranted, consider a single follow-up CT chest in 1 year. Mild mediastinal and  bilateral hilar lymphadenopathy, likely related to the patient's known CLL. Mild splenomegaly, incompletely visualized. Aortic Atherosclerosis and Emphysema  2. Newly diagnosed forehead cutaneous melanoma (pT3a, pN1  Mx) 2.73mm depth (atleast Stage IIIB) - patient declined adjuvant immunotheraphy  PLAN: -discussed lab results. Increased WBC count and persistent thromvocytopenia.no significant constitutional symptoms. --we discussed that patient meets criteria to treat his CLL -- he would like to hold off on  CLL treatment at this time. -we discussed importance of close dermatologic f/u for monitoring on recurrent melanoma an other skin cancers. -we discussed that immunosuppression related to CLL rx would potential be an additional risk factor for melanoma progression. -pet/ct to evaluation for melanoma progression.  FOLLOW UP: PET/CT in 10 weeks RTC with Dr Irene Limbo with labs in 12 weeks F/u with dermatology to assess rt leg new hyperpigmented lesions ASAP  The total time spent in the appt was 30 minutes and more than 50% was on counseling and direct patient cares.  All of the patient's questions were answered with apparent satisfaction. The patient knows to call the clinic with any problems, questions or concerns.   Sullivan Lone MD [place] AAHIVMS [facility] Hematology/Oncology Physician [facility]  (Office):       [phone] (Work cell):  [phone] (Fax):           [phone]  08/31/2020 3:30 AM  I, Yevette Edwards, am acting as a scribe for Dr. Sullivan Lone.   .I have reviewed the above documentation for accuracy and completeness, and I agree with the above. Brunetta Genera MD

## 2020-09-12 ENCOUNTER — Telehealth: Payer: Self-pay | Admitting: *Deleted

## 2020-09-12 NOTE — Telephone Encounter (Signed)
Thomas Zavala states he would like to discuss treatment options with Dr Irene Limbo.Has changed his mind about taking treatment.  Message sent scheduler to make appt

## 2020-09-13 ENCOUNTER — Telehealth: Payer: Self-pay | Admitting: Hematology

## 2020-09-13 DIAGNOSIS — Z85828 Personal history of other malignant neoplasm of skin: Secondary | ICD-10-CM | POA: Diagnosis not present

## 2020-09-13 DIAGNOSIS — Z08 Encounter for follow-up examination after completed treatment for malignant neoplasm: Secondary | ICD-10-CM | POA: Diagnosis not present

## 2020-09-13 DIAGNOSIS — C4442 Squamous cell carcinoma of skin of scalp and neck: Secondary | ICD-10-CM | POA: Diagnosis not present

## 2020-09-13 DIAGNOSIS — L570 Actinic keratosis: Secondary | ICD-10-CM | POA: Diagnosis not present

## 2020-09-13 NOTE — Telephone Encounter (Signed)
Scheduled follow-up appointment per 1/25 schedule message. Patient is aware. °

## 2020-09-13 NOTE — Progress Notes (Signed)
HEMATOLOGY/ONCOLOGY CLINIC  Date of Service: 09/13/2020  Patient Care Team: Corrington, Delsa Grana, MD as PCP - General (Family Medicine)  CHIEF COMPLAINTS/PURPOSE OF CONSULTATION:  Chronic Lymphocytic Leukemia Recently diagnosed Melanoma  HISTORY OF PRESENTING ILLNESS:   Thomas Zavala is a wonderful 87 y.o. male who has been referred to [place] by Bradly Bienenstock, NP for evaluation and management of his Chronic Lymphocytic Leukemia. The patient has been seen by Dr. Murriel Hopper in the past. He is accompanied today by his daughter. The pt reports that he is doing well overall.   The pt reports that he had ease of bruising in 2017 at which time he sought out a physical and was observed to have lymphocytosis and was diagnosed with CLL in November 2017. The pt denies having any other symptoms at that time. The pt has not had treatment for his CLL.  The pt notes that he has had some sweating at night between his legs, and feelings of flushness, which he notes started "in the last week or so." The pt denies fevers, chills, and noticing any new lumps or bumps. He has had recurrent squamous cell carcinomas for "several years," in sun-exposed areas. He follows with his dermatologist regularly for skin screenings.  The pt notes that his energy levels have diminished somewhat over the last 3 years, but denies fatigue being a limiting factor. He recently went on a 2 mile hike with his daughter. He has a one level living arrangement. The pt denies frequent or recurrent infections.   The pt notes that he follows with Dr. Gloriann Loan in urology for occasional hematuria. He notes that his DM and blood pressure have been well controlled.   The pt notes that he has had both of his pneumonia vaccines in the last 5 years and received his flu shot this season.  Most recent lab results (10/21/18) of CBC w/diff is as follows: all values are WNL except for WBC at WBC at 51.5k, PLT at 73k, Lymphs abs at 46.5k, Monocytes abs at  1.9k. 10/21/18 CMP is pending 10/21/18 LDH is pending  On review of systems, pt reports staying active, eating well, mildly lower energy levels, and denies drenching night sweats, fevers, new fatigue, chills, unexpected weight loss, frequent infections, recurrent infections, leg swelling, abdominal pains, and any other symptoms.  On PMHx the pt reports CLL, DM type II.   INTERVAL HISTORY:   Thomas Zavala returns today for management and evaluation of his CLL. The patient's last visit with [place] was on 08/31/2020. The pt reports that he is doing well overall. We are joined today by his daughter, Lanelle Bal.  The pt's daughter reports that they are here today because they are unsure of what impressions and recommendations Dr. Irene Limbo had last visit last week for Mr. Thomas Zavala. The pt did not remember much of the previous discussion and could not explain the plan to his daughter who help him make decisions.  The pt has not experienced any new symptoms related to his CLL.[name]  He did have his leg lesion biopsied by dermatology-- biopsy results not available. He shall be following with dermatology for that.  On review of systems, pt denies fevers, chills, night sweats, fatigue, unexpected weight loss, decreased appetite and any other symptoms.  MEDICAL HISTORY:  Past Medical History:  Diagnosis Date  . Abdominal hernia   . BPH (benign prostatic hyperplasia) 07/25/2016  . CLL (chronic lymphocytic leukemia) ([place])   . Hypertension   . Melanoma of forehead ([place])   .  Melanoma of scalp ([place])   . PVC's (premature ventricular contractions)   . Type 2 diabetes mellitus without complication, without long-term current use of insulin ([place]) 07/25/2016    SURGICAL HISTORY: Past Surgical History:  Procedure Laterality Date  . EYE SURGERY     eye lids  . IRRIGATION AND DEBRIDEMENT OF WOUND WITH SPLIT THICKNESS SKIN GRAFT Left 01/05/2020   Procedure: SPLIT THICKNESS SKIN GRAFT Left shoulder;  Surgeon: Wallace Going, DO;  Location: [facility];  Service: Plastics;  Laterality: Left;  [name] MASS EXCISION N/A 01/05/2020   Procedure: EXCISION OF FOREHEAD  MELANOMA;  Surgeon: Wallace Going, DO;  Location: [facility];  Service: Plastics;  Laterality: N/A;  . PAROTIDECTOMY Left 01/05/2020   Procedure: PAROTIDECTOMY;  Surgeon: Izora Gala, MD;  Location: [facility];  Service: ENT;  Laterality: Left;  . PROSTATE SURGERY    . SENTINEL NODE BIOPSY Left 01/05/2020   Procedure: Sentinel Node Biopsy;  Surgeon: Izora Gala, MD;  Location: [facility];  Service: ENT;  Laterality: Left;    SOCIAL HISTORY: Social History   Socioeconomic History  . Marital status: Widowed    Spouse name: Not on file  . Number of children: 3  . Years of education: Not on file  . Highest education level: Not on file  Occupational History  . Not on file  Tobacco Use  . Smoking status: Former Smoker    Packs/day: 1.00    Years: 25.00    Pack years: 25.00    Types: Cigarettes    Quit date: 1970    Years since quitting: 52.1  . Smokeless tobacco: Never Used  . Tobacco comment: quit in the 70's  Vaping Use  . Vaping Use: Never used  Substance and Sexual Activity  . Alcohol use: Yes    Comment: socially.  . Drug use: No  . Sexual activity: Not on file  Other Topics Concern  . Not on file  Social History Narrative  . Not on file   Social Determinants of Health   Financial Resource Strain: Not on file  Food Insecurity: Not on file  Transportation Needs: Not on file  Physical Activity: Not on file  Stress: Not on file  Social Connections: Not on file  Intimate Partner Violence: Not on file    FAMILY HISTORY: Family History  Problem Relation Age of Onset  . Hypertension Father     ALLERGIES:  is allergic to fluorouracil.  MEDICATIONS:  Current Outpatient Medications  Medication Sig Dispense Refill  . Carboxymethylcellul-Glycerin (LUBRICATING EYE DROPS OP) Place 1 drop into both eyes daily as needed (dry eyes).    .  carboxymethylcellul-glycerin (REFRESH OPTIVE) 0.5-0.9 % ophthalmic solution Apply to eye.    . finasteride (PROSCAR) 5 MG tablet Take 5 mg by mouth daily.    [name] FLUZONE HIGH-DOSE QUADRIVALENT 0.7 ML SUSY     . glucose blood (ONETOUCH ULTRA) test strip OneTouch Ultra Blue Test Strip  USE TO TEST BLOOD SUGAR DAILY AS INSTRUCTED    . losartan-hydrochlorothiazide (HYZAAR) 100-25 MG tablet Take 1 tablet by mouth daily.     . metFORMIN (GLUCOPHAGE-XR) 500 MG 24 hr tablet Take 500 mg by mouth daily.     . metoprolol tartrate (LOPRESSOR) 50 MG tablet Take 1 tablet (50 mg total) by mouth 3 (three) times daily. 270 tablet 3  . mupirocin ointment (BACTROBAN) 2 % Apply topically 2 (two) times daily.    [name] neomycin-polymyxin b-dexamethasone (MAXITROL) 3.5-10000-0.1 OINT SMARTSIG:Sparingly In Eye(s) Twice Daily    .  pravastatin (PRAVACHOL) 40 MG tablet Take 40 mg by mouth daily.     . tamsulosin (FLOMAX) 0.4 MG CAPS capsule Take 0.4 mg by mouth daily.     No current facility-administered medications for this visit.    REVIEW OF SYSTEMS:   10 Point review of Systems was done is negative except as noted above.  PHYSICAL EXAMINATION: ECOG PERFORMANCE STATUS: 2 - Symptomatic, <50% confined to bed  . Vitals:   09/14/20 1509  BP: (!) 177/86  Pulse: 68  Resp: 18  Temp: 97.8 F (36.6 C)  SpO2: 98%   Filed Weights   09/14/20 1509  Weight: 185 lb 4.8 oz (84.1 kg)   Body mass index is 25.84 kg/m.   Exam was given in a chair.  GENERAL:alert, in no acute distress and comfortable SKIN: no acute rashes, no significant lesions EYES: conjunctiva are pink and non-injected, sclera anicteric OROPHARYNX: MMM, no exudates, no oropharyngeal erythema or ulceration NECK: supple, no JVD LYMPH:  no palpable lymphadenopathy in the cervical, axillary or inguinal regions LUNGS: clear to auscultation b/l with normal respiratory effort HEART: regular rate & rhythm ABDOMEN:  normoactive bowel sounds , non tender,  not distended. Extremity: no pedal edema PSYCH: alert & oriented x 3 with fluent speech NEURO: no focal motor/sensory deficits  LABORATORY DATA:  I have reviewed the data as listed  . CBC Latest Ref Rng & Units 08/31/2020 04/14/2020 02/11/2020  WBC 4.0 - 10.5 K/uL 92.5(HH) 64.5(HH) 49.4(H)  Hemoglobin 13.0 - 17.0 g/dL 13.6 13.8 14.0  Hematocrit 39.0 - 52.0 % 40.7 41.4 41.4  Platelets 150 - 400 K/uL 69(L) 76(L) 64(L)    . CMP Latest Ref Rng & Units 08/31/2020 04/14/2020 02/11/2020  Glucose 70 - 99 mg/dL 139(H) 152(H) 197(H)  BUN 8 - 23 mg/dL 11 12 9   Creatinine 0.61 - 1.24 mg/dL 1.13 1.12 1.12  Sodium 135 - 145 mmol/L 133(L) 131(L) 129(L)  Potassium 3.5 - 5.1 mmol/L 3.3(L) 3.6 3.5  Chloride 98 - 111 mmol/L 92(L) 94(L) 91(L)  CO2 22 - 32 mmol/L 31 28 28   Calcium 8.9 - 10.3 mg/dL 9.1 9.6 9.0  Total Protein 6.5 - 8.1 g/dL 6.2(L) 6.1(L) 5.7(L)  Total Bilirubin 0.3 - 1.2 mg/dL 1.0 1.0 1.3(H)  Alkaline Phos 38 - 126 U/L 107 89 98  AST 15 - 41 U/L 18 18 17   ALT 0 - 44 U/L 11 8 11     07/15/16 Cytogenetics and Flow Cytometry:   11/12/19 of Molecular Pathology   01/05/20 of Surgical Pathology Report [id number])  RADIOGRAPHIC STUDIES: I have personally reviewed the radiological images as listed and agreed with the findings in the report. No results found.   ASSESSMENT & PLAN:  87 y.o. male with  1. Chronic Lymphocytic Leukemia Diagnosed in November 2017 with flow cytometry 07/15/16 Cytogenetics revealed Trisomy 12  09/03/16 CT A/P revealed Borderline splenomegaly with 1.3 cm low-attenuation lesion in the anterior aspect of spleen. Mild upper abdominal and right iliac lymphadenopathy. 4 mm indeterminate pulmonary nodule in right lung base. Recommend continued attention on follow-up CT. Incidentally noted benign right hepatic lobe hemangioma, mildly and enlarged prostate, and colonic diverticulosis  10/10/17 CT Chest revealed Scattered small bilateral pulmonary nodules, including  two dominant right middle lobe nodules measuring 5-6 mm, unchanged, likely benign. If clinically warranted, consider a single follow-up CT chest in 1 year. Mild mediastinal and bilateral hilar lymphadenopathy, likely related to the patient's known CLL. Mild splenomegaly, incompletely visualized. Aortic Atherosclerosis and Emphysema  2. 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  patient declined adjuvant immunotheraphy  PLAN: -Advised pt of his stage 3 CLL. The pt meets criteria for treatment based on his thrombocytopenia but given after weighing risks (infection risk, treatment burden, immunosuppression causing melanoma progression) vs benefit of CLL would like to  Monitor CLL closely but hold off on initiating CLL treatment at this time. -Advised pt that his Plt are low and he meets the soft criteria for treatment. The treatment would be to improve his blood counts, but not necessarily improve his QOL. -Advised pt that treatment can lead to worsened symptoms just due to the burden of treatment and complications, not due to the medication. Most clinical trials on these medications are 75 and under. We would choose a targeted therapy option as opposed to chemotherapy. -Advised pt that his Melanoma is a secondary condition regarding treatment for CLL, especially due to his new lesion on his right leg. The pt received a cutaneous Bx on this yesterday. -Discussed the potential for remission following treatment of CLL. Advised pt that we could use ongoing Ibrutinib pill (This may help shrink lymph nodes and improve counts, but only works while the pt is taking it, but increases risk of bleeding and elevated Plt and increased fluid overload) or use combination of targeted antibody Gazyva (Specifically targets CLL's CD20 lymphocytes that is an IV infusion once weekly for first month followed by once monthly for 6 months) and addition of Venetoclax for  one year after first cycle (Fixed duration treatment with possibility of remission IF tolerated and works). We would be as conservative an approach as possible due to age and other medical co-morbidities. -The treatments would require multiple visits, checkups, and may decrease counts initially prior to improvement. The treatments would suppress his immune system even further, which affects his skin cancers (Squamous, Basal Cell, and Melanoma) further. -Recommend pt receive PET scan if possible to observe progression of CLL and his melanoma -Advised pt we will try to avoid complications as much as possible. -Advised pt to consider goals of care and current point in life-- heavy medical burden and risk of complications versus monitoring and potential for it to progress. Discussed priorities given current condition and health considerations. The pt wishes to continue to monitor without treatment right now and live life as fully as possible at this time. -Advised pt that the burden of Squamous cells and Melanoma spread could lead to treatment needs for those conditions as well. -Dicussed bottom-line chain of command and treatment for Squamous Cell carcinomas. Dermatologist tries to freeze or cut, followed by surgeon incision, then localized radiation, and finally treated with immunotherapies here at clinic as final treatment option if other options are not possible. -Will keep PET/CT in 10 weeks. -Will continue to monitor and will see back in 12 weeks with labs.   FOLLOW UP: PET/CT in 10 weeks RTC with Dr Irene Limbo with labs in 12 weeks  The total time spent in the appointment was 30 minutes and more than 50% was on counseling and direct patient cares.  All of the patient's questions were answered with apparent satisfaction. The patient knows to call the clinic with any problems, questions or concerns.   Sullivan Lone MD [place] AAHIVMS [facility] Hematology/Oncology Physician [facility]  (Office):       [phone] (Work cell):  [phone] (Fax):           [phone]  09/13/2020  12:00 PM  I, Reinaldo Raddle, am acting as scribe for Dr. Sullivan Lone, MD.   .I have reviewed the above documentation for accuracy and completeness, and I agree with the above. Brunetta Genera MD

## 2020-09-14 ENCOUNTER — Inpatient Hospital Stay (HOSPITAL_BASED_OUTPATIENT_CLINIC_OR_DEPARTMENT_OTHER): Payer: PPO | Admitting: Hematology

## 2020-09-14 ENCOUNTER — Other Ambulatory Visit: Payer: Self-pay

## 2020-09-14 VITALS — BP 177/86 | HR 68 | Temp 97.8°F | Resp 18 | Ht 71.0 in | Wt 185.3 lb

## 2020-09-14 DIAGNOSIS — C4330 Malignant melanoma of unspecified part of face: Secondary | ICD-10-CM | POA: Diagnosis not present

## 2020-09-14 DIAGNOSIS — C9110 Chronic lymphocytic leukemia of B-cell type not having achieved remission: Secondary | ICD-10-CM | POA: Diagnosis not present

## 2020-10-30 ENCOUNTER — Other Ambulatory Visit: Payer: Self-pay | Admitting: Hematology

## 2020-10-30 ENCOUNTER — Telehealth: Payer: Self-pay | Admitting: *Deleted

## 2020-10-30 MED ORDER — LORAZEPAM 0.5 MG PO TABS: 0.5000 mg | ORAL_TABLET | Freq: Once | ORAL | 0 refills | Status: DC | PRN

## 2020-10-30 NOTE — Progress Notes (Signed)
Lorazepam ordered for claustrophobia

## 2020-10-30 NOTE — Telephone Encounter (Signed)
Patient has upcoming PET scan 3/24. Thomas Zavala he was very uncomfortable during last one as he does not like confined spaces. Wants to know if Dr. Irene Limbo will prescribe something to help him stay relaxed and get through it. Dr. Irene Limbo informed. Per Dr. Irene Limbo - lorazepam prescription sent to pharmacy. Please advise patient he is at high risk for confusion and falls and should not drive after taking this. He would need to ensure he has somebody else driving him. Contacted patient - LVM with information as stated by Dr. Irene Limbo above. Encouraged patient to contact office for questions.

## 2020-11-07 ENCOUNTER — Other Ambulatory Visit: Payer: Self-pay

## 2020-11-07 ENCOUNTER — Ambulatory Visit: Payer: PPO | Admitting: Podiatry

## 2020-11-07 DIAGNOSIS — M79674 Pain in right toe(s): Secondary | ICD-10-CM

## 2020-11-07 DIAGNOSIS — M79675 Pain in left toe(s): Secondary | ICD-10-CM | POA: Diagnosis not present

## 2020-11-07 DIAGNOSIS — R600 Localized edema: Secondary | ICD-10-CM

## 2020-11-07 DIAGNOSIS — E119 Type 2 diabetes mellitus without complications: Secondary | ICD-10-CM | POA: Diagnosis not present

## 2020-11-07 DIAGNOSIS — B351 Tinea unguium: Secondary | ICD-10-CM | POA: Diagnosis not present

## 2020-11-09 ENCOUNTER — Other Ambulatory Visit: Payer: Self-pay

## 2020-11-09 ENCOUNTER — Ambulatory Visit (HOSPITAL_COMMUNITY)
Admission: RE | Admit: 2020-11-09 | Discharge: 2020-11-09 | Disposition: A | Discharge status: Home / Self Care | Payer: PPO | Source: Ambulatory Visit | Attending: Hematology | Admitting: Hematology

## 2020-11-09 DIAGNOSIS — C9110 Chronic lymphocytic leukemia of B-cell type not having achieved remission: Secondary | ICD-10-CM | POA: Insufficient documentation

## 2020-11-09 DIAGNOSIS — C4330 Malignant melanoma of unspecified part of face: Secondary | ICD-10-CM | POA: Diagnosis not present

## 2020-11-09 DIAGNOSIS — R161 Splenomegaly, not elsewhere classified: Secondary | ICD-10-CM | POA: Insufficient documentation

## 2020-11-09 DIAGNOSIS — C434 Malignant melanoma of scalp and neck: Secondary | ICD-10-CM | POA: Diagnosis not present

## 2020-11-09 LAB — GLUCOSE, CAPILLARY: Glucose-Capillary: 150 mg/dL — ABNORMAL HIGH (ref 70–99)

## 2020-11-09 IMAGING — PT NM PET IMAGE RESTAGE (PS) WHOLE BODY
7 series · 25 of 25 positions shown · non-contrast
Comparison: PET-CT dated [DATE]

CLINICAL DATA: Subsequent treatment strategy for scalp melanoma.
History of CLL.

EXAM:
NUCLEAR MEDICINE PET WHOLE BODY
TECHNIQUE: 9.3 mCi F-18 FDG was injected intravenously. Full-ring PET imaging
was performed from the head to foot after the radiotracer. CT data
was obtained and used for attenuation correction and anatomic
localization.
Fasting blood glucose: 150 mg/dl

[Series 3: ac ct wb 5.0 hd_fov · axial · 5.0mm · 1.52mm/px · z∈[-412,+1492]mm · 5 of 473 slices shown]
[im 1/473  full-range]
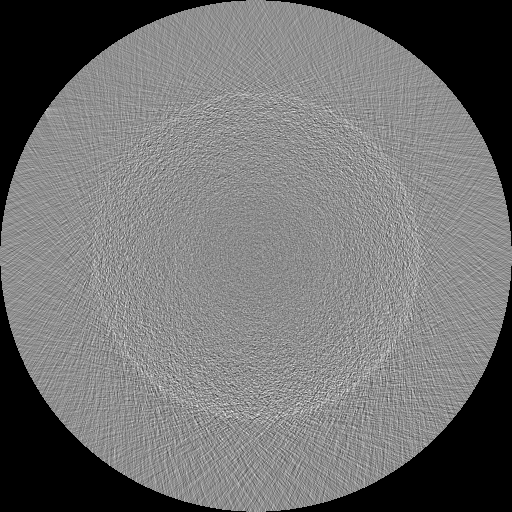
[im 119/473  soft-tissue]
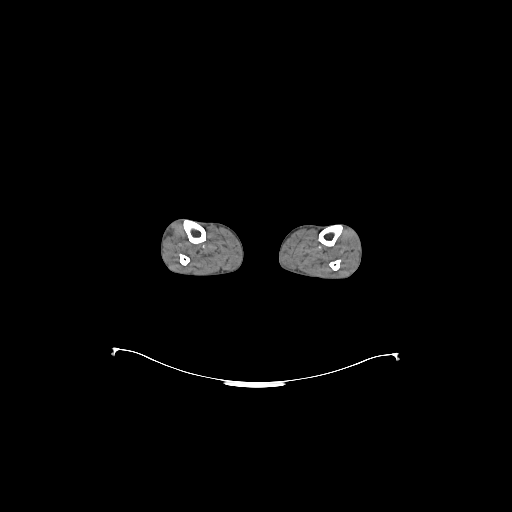
[im 237/473  soft-tissue]
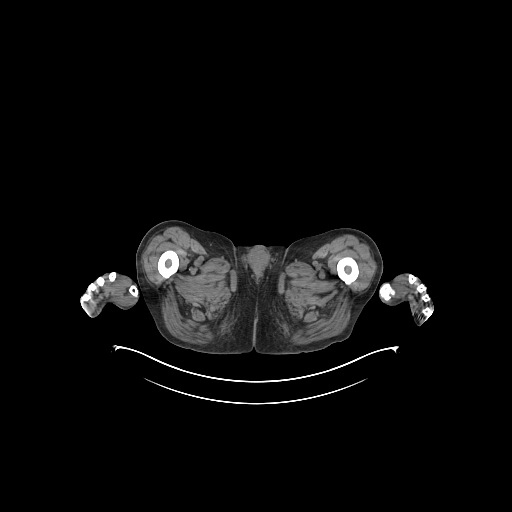
[im 355/473  soft-tissue]
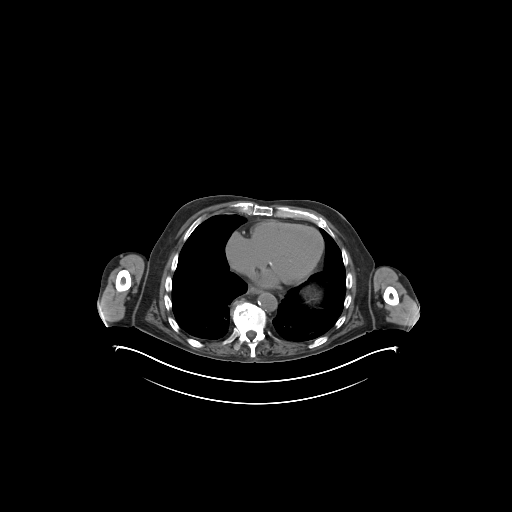
[im 473/473  soft-tissue]
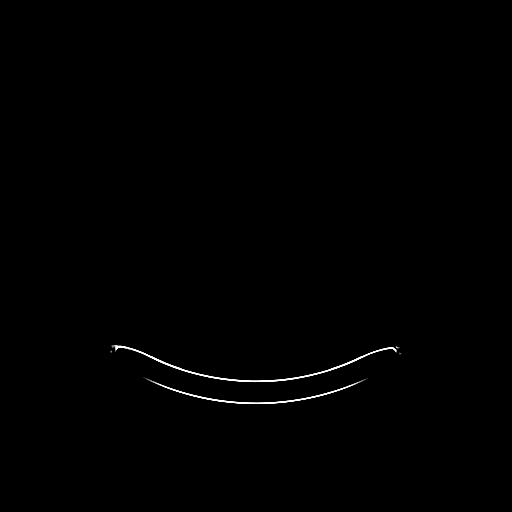

[Series 4: pet wb ac · axial · 5.0mm · 4.07mm/px · z∈[-408,+1492]mm · 5 of 476 slices shown]
[im 1/476]
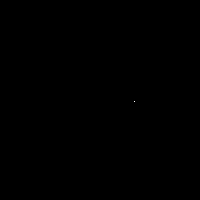
[im 119/476]
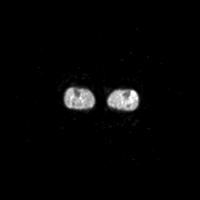
[im 238/476]
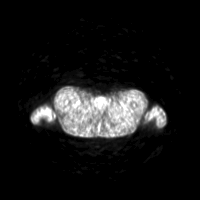
[im 357/476]
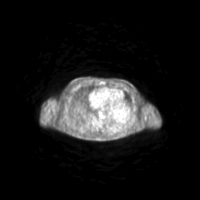
[im 476/476]
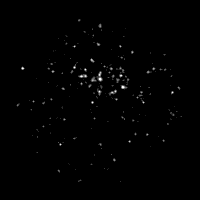

[Series 6: pet wb nac · axial · 5.0mm · 4.07mm/px · z∈[-408,+1492]mm · 6 of 476 slices shown]
[im 1/476  full-range]
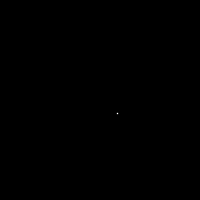
[im 96/476]
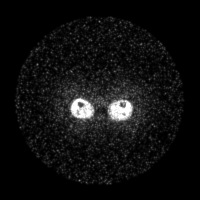
[im 191/476]
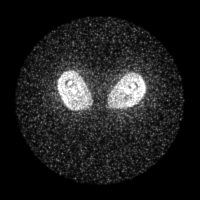
[im 286/476]
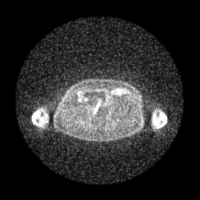
[im 381/476]
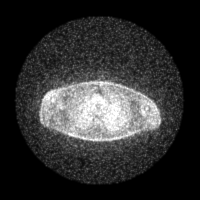
[im 476/476]
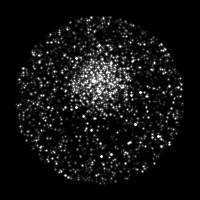

[Series 9: ct wb 5.0 br59 lung_bone · axial · 5.0mm · 0.69mm/px · 1 of 69 slices shown]
[im 1/69  lung]
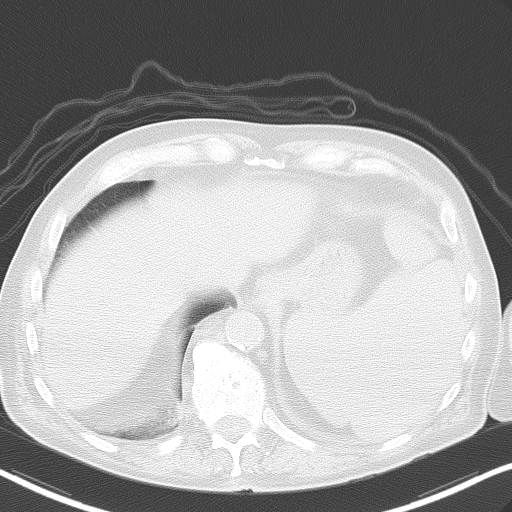

[Series 603: fused cor · 1 of 40 slices shown]
[im 1/40]
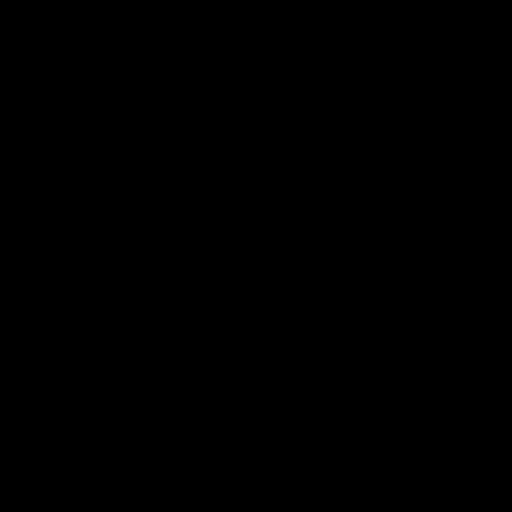

[Series 604: <mip collection> · coronal · 3.94mm/px · 1 of 32 slices shown]
[im 1/32]
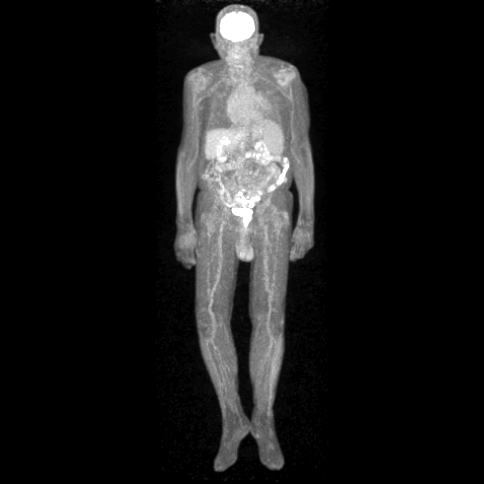

[Series 605: range-ac ct wb 5.0 hd_fov-tra-<alpha range> · 6 of 453 slices shown]
[im 1/453]
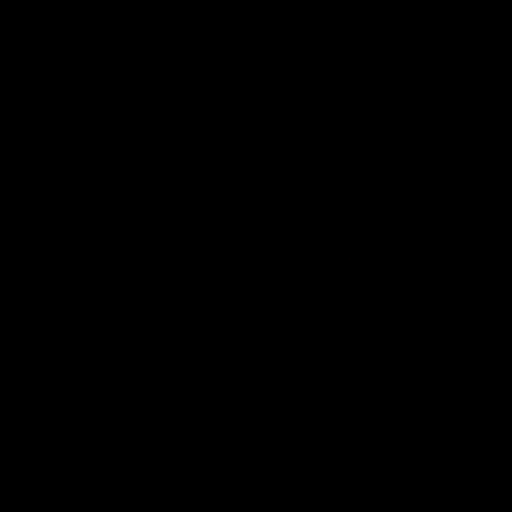
[im 91/453]
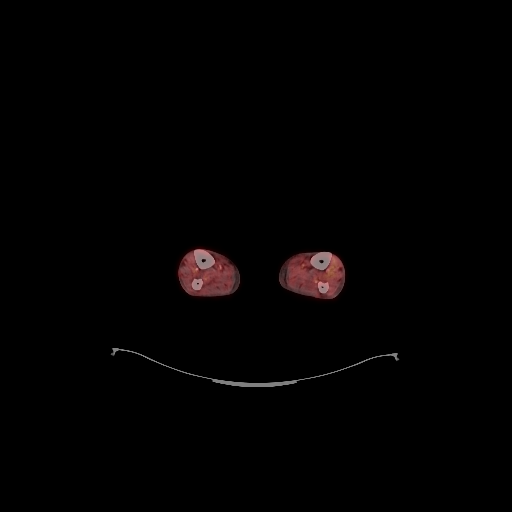
[im 181/453]
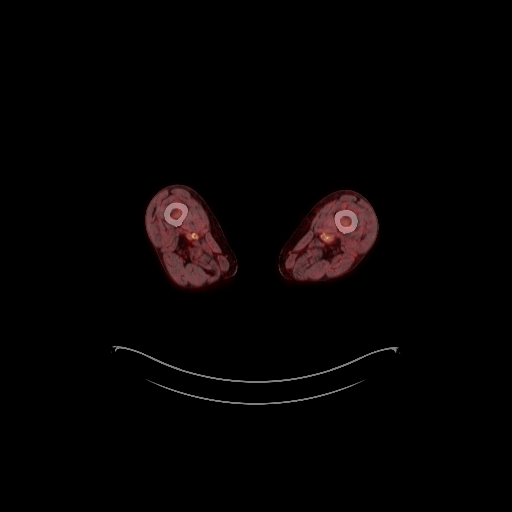
[im 272/453]
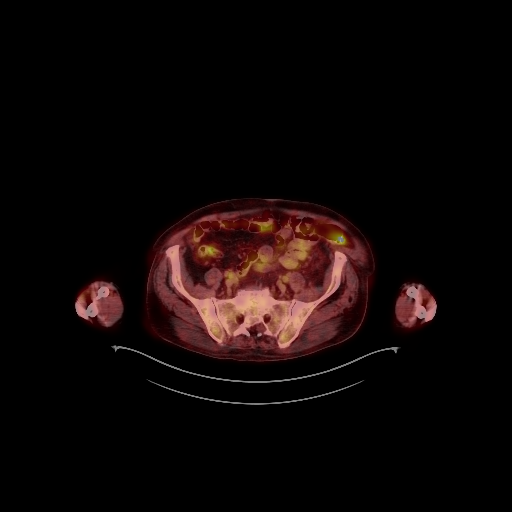
[im 362/453]
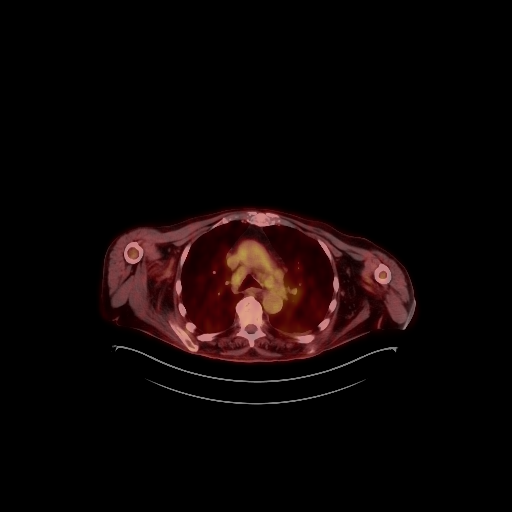
[im 453/453]
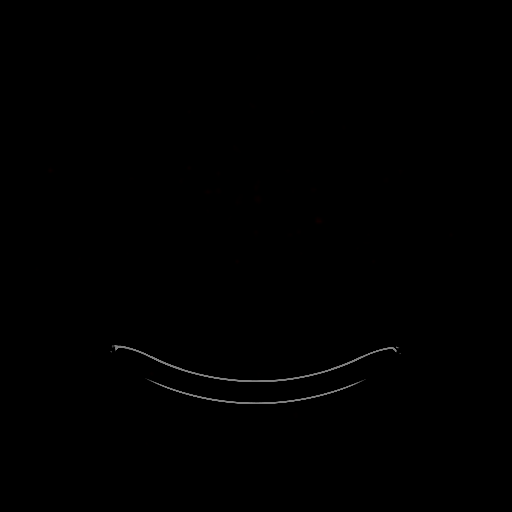

[25 of 25 positions shown; findings below may reference images not displayed]

FINDINGS: Mediastinal blood pool activity: SUV max

HEAD/NECK: Equivocal focal cutaneous thickening along the right
medial forehead (series 3/image 31) with associated mild
hypermetabolism, max SUV 2.9. Correlate with visual inspection.

Small bilateral cervical nodes, measuring up to 8 mm short axis on
the left (series 3/image 61), max SUV 2.1.

Incidental CT findings: none

CHEST: No suspicious pulmonary nodules. Small perifissural nodules
in the right middle lobe (series 3/image 118), unchanged, benign.

Small mediastinal lymph nodes, including a 16 mm short axis right
paratracheal node (series 3/image 95) with max SUV 3.1 and a 14 mm
short axis subcarinal node (series 3/image 101) with max SUV 3.0,
previously max SUV 3.0 and 2.7 respectively, unchanged.

Incidental CT findings: Atherosclerotic calcifications of the aortic
arch. Mild three-vessel coronary atherosclerosis. Trace bilateral
pleural effusions, left greater than right, new.

ABDOMEN/PELVIS: Mild splenomegaly, similar to the prior, but without
focal hypermetabolism or lesion.

No abnormal metabolism in the liver, pancreas, or adrenal glands.

Small retroperitoneal and pelvic nodes, including an 8 mm short axis
left para-aortic node (series 3/image 152) and a 6 mm short axis
right common iliac node (series 3/image 180), grossly unchanged from
the prior, without appreciable hypermetabolism.

Incidental CT findings: Atherosclerotic calcifications of the
abdominal aorta and branch vessels. Left colonic diverticulosis,
without evidence of diverticulitis. Prostatomegaly with postsurgical
changes related to prior TURP.

SKELETON: No focal hypermetabolic activity to suggest skeletal
metastasis.

Incidental CT findings: Degenerative changes of the visualized
thoracolumbar spine.

EXTREMITIES: No abnormal hypermetabolic activity in the lower
extremities.

Incidental CT findings: none
IMPRESSION: Mild hypermetabolism with equivocal focal cutaneous thickening along
the right medial forehead. Correlate with visual inspection given
the clinical history of scalp melanoma. No findings suspicious for
metastatic disease.

Small left cervical and mediastinal nodes with mild hypermetabolism,
likely related to the patient's history of CLL, unchanged.
Additional small retroperitoneal/right pelvic nodes, without
hypermetabolism. Mild splenomegaly, unchanged.

## 2020-11-09 MED ORDER — FLUDEOXYGLUCOSE F - 18 (FDG) INJECTION
9.6000 | Freq: Once | INTRAVENOUS | Status: AC
  Administered 2020-11-09: 9.27 via INTRAVENOUS

## 2020-11-12 ENCOUNTER — Encounter: Payer: Self-pay | Admitting: Podiatry

## 2020-11-12 NOTE — Progress Notes (Signed)
  Subjective:  [name], male    DOB: Dec 24, 1927,  MRN: [id number]  87 y.o. male presents with preventative diabetic foot care and painful thick toenails that are difficult to trim. Pain interferes with ambulation. Aggravating factors include wearing enclosed shoe gear. Pain is relieved with periodic professional debridement.[name]    He voices no new pedal complaints on today's visit. He states he will be starting chemotherapy for CLL soon.  PCP: Corrington, Kip A, MD and last visit was: 07/11/2020.  Review of Systems: Negative except as noted in the HPI.   Allergies  Allergen Reactions  . Fluorouracil Other (See Comments)    Burns over affected area (SJS) Other reaction(s): Facial Swelling     Objective:  There were no vitals filed for this visit. Constitutional Patient is a pleasant 87 y.o. Caucasian male in NAD. AAO x 3.  Vascular Capillary refill time to digits immediate b/l. Palpable pedal pulses b/l LE. Pedal hair sparse. Lower extremity skin temperature gradient within normal limits. No pain with calf compression b/l. Trace edema noted b/l lower extremities. No cyanosis or clubbing noted.  Neurologic Normal speech. Protective sensation intact 5/5 intact bilaterally with 10g monofilament b/l. Vibratory sensation intact b/l.  Dermatologic Pedal skin is thin shiny, atrophic b/l lower extremities. No open wounds bilaterally. No interdigital macerations bilaterally. Toenails 1-5 b/l elongated, discolored, dystrophic, thickened, crumbly with subungual debris and tenderness to dorsal palpation.  Orthopedic: Normal muscle strength 5/5 to all lower extremity muscle groups bilaterally. No pain crepitus or joint limitation noted with ROM b/l. No gross bony deformities bilaterally.    Assessment:   1. Pain due to onychomycosis of toenails of both feet   2. Localized edema   3. Type 2 diabetes mellitus without complication, without long-term current use of insulin ([place])     Plan:  Patient was evaluated and treated and all questions answered.  Onychomycosis with pain -Nails palliatively debridement as below. -Educated on self-care  Procedure: Nail Debridement Rationale: Pain Type of Debridement: manual, sharp debridement. Instrumentation: Nail nipper, rotary burr. Number of Nails: 10  -Examined patient. -Continue diabetic foot care principles. -Patient to continue soft, supportive shoe gear daily. -Toenails 1-5 b/l were debrided in length and girth with sterile nail nippers and dremel without iatrogenic bleeding.  -Patient to report any pedal injuries to medical professional immediately. -Patient/POA to call should there be question/concern in the interim.  Return in about 3 months (around 02/07/2021).  Marzetta Board, DPM

## 2020-11-22 ENCOUNTER — Other Ambulatory Visit: Payer: Self-pay | Admitting: *Deleted

## 2020-11-22 DIAGNOSIS — C9110 Chronic lymphocytic leukemia of B-cell type not having achieved remission: Secondary | ICD-10-CM

## 2020-11-22 DIAGNOSIS — Z85828 Personal history of other malignant neoplasm of skin: Secondary | ICD-10-CM | POA: Diagnosis not present

## 2020-11-22 DIAGNOSIS — X32XXXD Exposure to sunlight, subsequent encounter: Secondary | ICD-10-CM | POA: Diagnosis not present

## 2020-11-22 DIAGNOSIS — Z08 Encounter for follow-up examination after completed treatment for malignant neoplasm: Secondary | ICD-10-CM | POA: Diagnosis not present

## 2020-11-22 DIAGNOSIS — L570 Actinic keratosis: Secondary | ICD-10-CM | POA: Diagnosis not present

## 2020-11-22 DIAGNOSIS — C4442 Squamous cell carcinoma of skin of scalp and neck: Secondary | ICD-10-CM | POA: Diagnosis not present

## 2020-11-23 NOTE — Progress Notes (Signed)
HEMATOLOGY/ONCOLOGY CLINIC  Date of Service: 11/24/2020  Patient Care Team: Corrington, Delsa Grana, MD as PCP - General (Family Medicine)  CHIEF COMPLAINTS/PURPOSE OF CONSULTATION:  Chronic Lymphocytic Leukemia Recently diagnosed Melanoma  HISTORY OF PRESENTING ILLNESS:   Thomas Zavala is a wonderful 87 y.o. male who has been referred to [place] by Bradly Bienenstock, NP for evaluation and management of his Chronic Lymphocytic Leukemia. The patient has been seen by Dr. Murriel Hopper in the past. He is accompanied today by his daughter. The pt reports that he is doing well overall.   The pt reports that he had ease of bruising in 2017 at which time he sought out a physical and was observed to have lymphocytosis and was diagnosed with CLL in November 2017. The pt denies having any other symptoms at that time. The pt has not had treatment for his CLL.  The pt notes that he has had some sweating at night between his legs, and feelings of flushness, which he notes started "in the last week or so." The pt denies fevers, chills, and noticing any new lumps or bumps. He has had recurrent squamous cell carcinomas for "several years," in sun-exposed areas. He follows with his dermatologist regularly for skin screenings.  The pt notes that his energy levels have diminished somewhat over the last 3 years, but denies fatigue being a limiting factor. He recently went on a 2 mile hike with his daughter. He has a one level living arrangement. The pt denies frequent or recurrent infections.   The pt notes that he follows with Dr. Gloriann Loan in urology for occasional hematuria. He notes that his DM and blood pressure have been well controlled.   The pt notes that he has had both of his pneumonia vaccines in the last 5 years and received his flu shot this season.  Most recent lab results (10/21/18) of CBC w/diff is as follows: all values are WNL except for WBC at WBC at 51.5k, PLT at 73k, Lymphs abs at 46.5k, Monocytes abs at  1.9k. 10/21/18 CMP is pending 10/21/18 LDH is pending  On review of systems, pt reports staying active, eating well, mildly lower energy levels, and denies drenching night sweats, fevers, new fatigue, chills, unexpected weight loss, frequent infections, recurrent infections, leg swelling, abdominal pains, and any other symptoms.  On PMHx the pt reports CLL, DM type II.   INTERVAL HISTORY:   Thomas Zavala returns today for management and evaluation of his CLL. The patient's last visit with [place] was on 10/30/2020. The pt reports that he is doing well overall. We are joined today by his daughter, Lanelle Bal.  The pt reports no new concerns or symptoms. The pt notes his dermatologist is interested in giving him a Retinoid medication for his secondary cancers he keeps having. The pt went yesterday and got more spots on his head scraped.  The pt has had NM PET Whole Body ([id number]) on 11/09/2020, which revealed "Mild hypermetabolism with equivocal focal cutaneous thickening along the right medial forehead. Correlate with visual inspection given the clinical history of scalp melanoma. No findings suspicious for metastatic disease. Small left cervical and mediastinal nodes with mild hypermetabolism, likely related to the patient's history of CLL, unchanged. Additional small retroperitoneal/right pelvic nodes, without hypermetabolism. Mild splenomegaly, unchanged."  Lab results today 11/24/2020 of CBC w/diff and CMP is as follows: all values are WNL except for WBC of 74.8K, RBC of 4.12, Plt of 90K, Lymphs Abs of 68.7K, Monocytes Abs of 2.5K, abs  Immature ranulocytes of 0.10K, Sodium of 131, Chloride of 93, Glucose of 129, GFR est of 59. 11/24/2020 LDH pending.  On review of systems, pt denies acute skin lesions, fevers, chills, drenching night sweats, fatigue, abdominal pain, leg swelling, changes in bowel habits, and any other symptoms.  MEDICAL HISTORY:  Past Medical History:  Diagnosis Date  . Abdominal  hernia   . BPH (benign prostatic hyperplasia) 07/25/2016  . CLL (chronic lymphocytic leukemia) ([place])   . Hypertension   . Melanoma of forehead ([place])   . Melanoma of scalp ([place])   . PVC's (premature ventricular contractions)   . Type 2 diabetes mellitus without complication, without long-term current use of insulin ([place]) 07/25/2016    SURGICAL HISTORY: Past Surgical History:  Procedure Laterality Date  . EYE SURGERY     eye lids  . IRRIGATION AND DEBRIDEMENT OF WOUND WITH SPLIT THICKNESS SKIN GRAFT Left 01/05/2020   Procedure: SPLIT THICKNESS SKIN GRAFT Left shoulder;  Surgeon: Wallace Going, DO;  Location: [facility];  Service: Plastics;  Laterality: Left;  [name] MASS EXCISION N/A 01/05/2020   Procedure: EXCISION OF FOREHEAD  MELANOMA;  Surgeon: Wallace Going, DO;  Location: [facility];  Service: Plastics;  Laterality: N/A;  . PAROTIDECTOMY Left 01/05/2020   Procedure: PAROTIDECTOMY;  Surgeon: Izora Gala, MD;  Location: [facility];  Service: ENT;  Laterality: Left;  . PROSTATE SURGERY    . SENTINEL NODE BIOPSY Left 01/05/2020   Procedure: Sentinel Node Biopsy;  Surgeon: Izora Gala, MD;  Location: [facility];  Service: ENT;  Laterality: Left;    SOCIAL HISTORY: Social History   Socioeconomic History  . Marital status: Widowed    Spouse name: Not on file  . Number of children: 3  . Years of education: Not on file  . Highest education level: Not on file  Occupational History  . Not on file  Tobacco Use  . Smoking status: Former Smoker    Packs/day: 1.00    Years: 25.00    Pack years: 25.00    Types: Cigarettes    Quit date: 1970    Years since quitting: 52.3  . Smokeless tobacco: Never Used  . Tobacco comment: quit in the 70's  Vaping Use  . Vaping Use: Never used  Substance and Sexual Activity  . Alcohol use: Yes    Comment: socially.  . Drug use: No  . Sexual activity: Not on file  Other Topics Concern  . Not on file  Social History Narrative  . Not on file   Social  Determinants of Health   Financial Resource Strain: Not on file  Food Insecurity: Not on file  Transportation Needs: Not on file  Physical Activity: Not on file  Stress: Not on file  Social Connections: Not on file  Intimate Partner Violence: Not on file    FAMILY HISTORY: Family History  Problem Relation Age of Onset  . Hypertension Father     ALLERGIES:  is allergic to fluorouracil.  MEDICATIONS:  Current Outpatient Medications  Medication Sig Dispense Refill  . Carboxymethylcellul-Glycerin (LUBRICATING EYE DROPS OP) Place 1 drop into both eyes daily as needed (dry eyes).    . carboxymethylcellul-glycerin (REFRESH OPTIVE) 0.5-0.9 % ophthalmic solution Apply to eye.    . finasteride (PROSCAR) 5 MG tablet Take 5 mg by mouth daily.    [name] glucose blood (ONETOUCH ULTRA) test strip OneTouch Ultra Blue Test Strip  USE TO TEST BLOOD SUGAR DAILY AS INSTRUCTED    . glucose blood  test strip OneTouch Ultra Blue Test Strip  USE TO TEST BLOOD SUGAR DAILY AS INSTRUCTED    . losartan-hydrochlorothiazide (HYZAAR) 100-25 MG tablet Take 1 tablet by mouth daily.     . metFORMIN (GLUCOPHAGE-XR) 500 MG 24 hr tablet Take 500 mg by mouth daily.     . metoprolol tartrate (LOPRESSOR) 50 MG tablet Take 1 tablet (50 mg total) by mouth 3 (three) times daily. 270 tablet 3  . pravastatin (PRAVACHOL) 40 MG tablet Take 40 mg by mouth daily.     . cephALEXin (KEFLEX) 500 MG capsule cephalexin 500 mg capsule (Patient not taking: Reported on 11/24/2020)    . FLUZONE HIGH-DOSE QUADRIVALENT 0.7 ML SUSY  (Patient not taking: Reported on 11/24/2020)    . LORazepam (ATIVAN) 0.5 MG tablet Take 1-2 tablets (0.5-1 mg total) by mouth once as needed for up to 1 dose for anxiety. 30 -45 mins prior to MRI or CT scan for claustrophobia (Patient not taking: Reported on 11/24/2020) 2 tablet 0  . mupirocin ointment (BACTROBAN) 2 % Apply topically 2 (two) times daily. (Patient not taking: Reported on 11/24/2020)    . neomycin-polymyxin  b-dexamethasone (MAXITROL) 3.5-10000-0.1 OINT SMARTSIG:Sparingly In Eye(s) Twice Daily (Patient not taking: Reported on 11/24/2020)    . tamsulosin (FLOMAX) 0.4 MG CAPS capsule Take 0.4 mg by mouth daily. (Patient not taking: Reported on 11/24/2020)     No current facility-administered medications for this visit.    REVIEW OF SYSTEMS:   10 Point review of Systems was done is negative except as noted above.  PHYSICAL EXAMINATION: ECOG PERFORMANCE STATUS: 2 - Symptomatic, <50% confined to bed  . Vitals:   11/24/20 1203  BP: (!) 165/63  Pulse: 60  Resp: 16  Temp: 97.7 F (36.5 C)  SpO2: 100%   Filed Weights   11/24/20 1203  Weight: 186 lb 6.4 oz (84.6 kg)   Body mass index is 26 kg/m.   Exam was given in a chair.   GENERAL:alert, in no acute distress and comfortable SKIN: no acute rashes, no significant lesions EYES: conjunctiva are pink and non-injected, sclera anicteric OROPHARYNX: MMM, no exudates, no oropharyngeal erythema or ulceration NECK: supple, no JVD LYMPH:  no palpable lymphadenopathy in the cervical, axillary or inguinal regions LUNGS: clear to auscultation b/l with normal respiratory effort HEART: regular rate & rhythm ABDOMEN:  normoactive bowel sounds , non tender, not distended. Extremity: no pedal edema PSYCH: alert & oriented x 3 with fluent speech NEURO: no focal motor/sensory deficits  LABORATORY DATA:  I have reviewed the data as listed  . CBC Latest Ref Rng & Units 11/24/2020 08/31/2020 04/14/2020  WBC 4.0 - 10.5 K/uL 74.8(HH) 92.5(HH) 64.5(HH)  Hemoglobin 13.0 - 17.0 g/dL 13.5 13.6 13.8  Hematocrit 39.0 - 52.0 % 40.7 40.7 41.4  Platelets 150 - 400 K/uL 90(L) 69(L) 76(L)    . CMP Latest Ref Rng & Units 11/24/2020 08/31/2020 04/14/2020  Glucose 70 - 99 mg/dL 129(H) 139(H) 152(H)  BUN 8 - 23 mg/dL 11 11 12   Creatinine 0.61 - 1.24 mg/dL 1.18 1.13 1.12  Sodium 135 - 145 mmol/L 131(L) 133(L) 131(L)  Potassium 3.5 - 5.1 mmol/L 4.3 3.3(L) 3.6  Chloride  98 - 111 mmol/L 93(L) 92(L) 94(L)  CO2 22 - 32 mmol/L 25 31 28   Calcium 8.9 - 10.3 mg/dL 8.9 9.1 9.6  Total Protein 6.5 - 8.1 g/dL 6.5 6.2(L) 6.1(L)  Total Bilirubin 0.3 - 1.2 mg/dL 1.2 1.0 1.0  Alkaline Phos 38 - 126 U/L 118  107 89  AST 15 - 41 U/L 18 18 18   ALT 0 - 44 U/L 11 11 8     07/15/16 Cytogenetics and Flow Cytometry:   11/12/19 of Molecular Pathology   01/05/20 of Surgical Pathology Report [phone])  RADIOGRAPHIC STUDIES: I have personally reviewed the radiological images as listed and agreed with the findings in the report. NM PET Image Restage (PS) Whole Body  Result Date: 11/10/2020 CLINICAL DATA:  Subsequent treatment strategy for scalp melanoma. History of CLL. EXAM: NUCLEAR MEDICINE PET WHOLE BODY TECHNIQUE: 9.3 mCi F-18 FDG was injected intravenously. Full-ring PET imaging was performed from the head to foot after the radiotracer. CT data was obtained and used for attenuation correction and anatomic localization. Fasting blood glucose: 150 mg/dl COMPARISON:  PET-CT dated 02/08/2020 FINDINGS: Mediastinal blood pool activity: SUV max 3.0 HEAD/NECK: Equivocal focal cutaneous thickening along the right medial forehead (series 3/image 31) with associated mild hypermetabolism, max SUV 2.9. Correlate with visual inspection. Small bilateral cervical nodes, measuring up to 8 mm short axis on the left (series 3/image 61), max SUV 2.1. Incidental CT findings: none CHEST: No suspicious pulmonary nodules. Small perifissural nodules in the right middle lobe (series 3/image 118), unchanged, benign. Small mediastinal lymph nodes, including a 16 mm short axis right paratracheal node (series 3/image 95) with max SUV 3.1 and a 14 mm short axis subcarinal node (series 3/image 101) with max SUV 3.0, previously max SUV 3.0 and 2.7 respectively, unchanged. Incidental CT findings: Atherosclerotic calcifications of the aortic arch. Mild three-vessel coronary atherosclerosis. Trace bilateral pleural  effusions, left greater than right, new. ABDOMEN/PELVIS: Mild splenomegaly, similar to the prior, but without focal hypermetabolism or lesion. No abnormal metabolism in the liver, pancreas, or adrenal glands. Small retroperitoneal and pelvic nodes, including an 8 mm short axis left para-aortic node (series 3/image 152) and a 6 mm short axis right common iliac node (series 3/image 180), grossly unchanged from the prior, without appreciable hypermetabolism. Incidental CT findings: Atherosclerotic calcifications of the abdominal aorta and branch vessels. Left colonic diverticulosis, without evidence of diverticulitis. Prostatomegaly with postsurgical changes related to prior TURP. SKELETON: No focal hypermetabolic activity to suggest skeletal metastasis. Incidental CT findings: Degenerative changes of the visualized thoracolumbar spine. EXTREMITIES: No abnormal hypermetabolic activity in the lower extremities. Incidental CT findings: none IMPRESSION: Mild hypermetabolism with equivocal focal cutaneous thickening along the right medial forehead. Correlate with visual inspection given the clinical history of scalp melanoma. No findings suspicious for metastatic disease. Small left cervical and mediastinal nodes with mild hypermetabolism, likely related to the patient's history of CLL, unchanged. Additional small retroperitoneal/right pelvic nodes, without hypermetabolism. Mild splenomegaly, unchanged. Electronically Signed   By: Julian Hy M.D.   On: 11/10/2020 07:20     ASSESSMENT & PLAN:  87 y.o. male with  1. Chronic Lymphocytic Leukemia Diagnosed in November 2017 with flow cytometry 07/15/16 Cytogenetics revealed Trisomy 12  09/03/16 CT A/P revealed Borderline splenomegaly with 1.3 cm low-attenuation lesion in the anterior aspect of spleen. Mild upper abdominal and right iliac lymphadenopathy. 4 mm indeterminate pulmonary nodule in right lung base. Recommend continued attention on follow-up CT.  Incidentally noted benign right hepatic lobe hemangioma, mildly and enlarged prostate, and colonic diverticulosis  10/10/17 CT Chest revealed Scattered small bilateral pulmonary nodules, including two dominant right middle lobe nodules measuring 5-6 mm, unchanged, likely benign. If clinically warranted, consider a single follow-up CT chest in 1 year. Mild mediastinal and bilateral hilar lymphadenopathy, likely related to the patient's known CLL. Mild splenomegaly,  incompletely visualized. Aortic Atherosclerosis and Emphysema  2. 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  patient declined adjuvant immunotheraphy  PLAN: -Discussed pt labwork today, 11/24/2020; blood counts stable, WBC improved slightly, Plt improved, chemistries stable. -Discussed pt NM PET Whole Body ([id number]) on 11/09/2020; stable small lymph nodes not lighting up that much, consistent with known CLL. Spleen size stable. -Advised pt that the medication need monitoring and we are unsure how it would affect his CLL.  -Advised pt to not be worried about costs of potential CLL oral treatments, as most copays are very low after insurance and grants etc. -Recommended pt receive his second COVID booster. -Discussed Evusheld and pt's eligibility. Will send referral. -No overt lab or clinical evidence to transition into starting to treatment CLL at this time. No evidence of progression at this time. -Continue to f/u w Dermatology regarding secondary skin cancer spots. -Discussed burden of care and monitoring versus potential benefit of retinoid medications. Warned against starting this due to burden of care but advised it is pt personal choice. -Will see back in 4 months with labs.   FOLLOW UP: Referral for Evusheld RTC with Dr Irene Limbo with labs in 4 months   The total time spent in the appointment was 30 minutes and more than 50% was on counseling and direct patient  cares.  All of the patient's questions were answered with apparent satisfaction. The patient knows to call the clinic with any problems, questions or concerns.   Sullivan Lone MD [place] AAHIVMS [facility] Hematology/Oncology Physician [facility]  (Office):       [phone] (Work cell):  [phone] (Fax):           [phone]  11/24/2020 12:51 PM  I, Reinaldo Raddle, am acting as scribe for Dr. Sullivan Lone, MD.    .I have reviewed the above documentation for accuracy and completeness, and I agree with the above. Brunetta Genera MD

## 2020-11-24 ENCOUNTER — Inpatient Hospital Stay: Payer: PPO | Attending: Family Medicine

## 2020-11-24 ENCOUNTER — Inpatient Hospital Stay: Payer: PPO | Admitting: Hematology

## 2020-11-24 ENCOUNTER — Other Ambulatory Visit: Payer: Self-pay

## 2020-11-24 VITALS — BP 165/63 | HR 60 | Temp 97.7°F | Resp 16 | Ht 71.0 in | Wt 186.4 lb

## 2020-11-24 DIAGNOSIS — Z87891 Personal history of nicotine dependence: Secondary | ICD-10-CM | POA: Insufficient documentation

## 2020-11-24 DIAGNOSIS — C9110 Chronic lymphocytic leukemia of B-cell type not having achieved remission: Secondary | ICD-10-CM | POA: Insufficient documentation

## 2020-11-24 DIAGNOSIS — C4330 Malignant melanoma of unspecified part of face: Secondary | ICD-10-CM | POA: Diagnosis not present

## 2020-11-24 DIAGNOSIS — C434 Malignant melanoma of scalp and neck: Secondary | ICD-10-CM | POA: Insufficient documentation

## 2020-11-24 DIAGNOSIS — Z298 Encounter for other specified prophylactic measures: Secondary | ICD-10-CM | POA: Insufficient documentation

## 2020-11-24 LAB — SAMPLE TO BLOOD BANK

## 2020-11-24 LAB — CMP (CANCER CENTER ONLY)
ALT: 11 U/L (ref 0–44)
AST: 18 U/L (ref 15–41)
Albumin: 4 g/dL (ref 3.5–5.0)
Alkaline Phosphatase: 118 U/L (ref 38–126)
Anion gap: 13 (ref 5–15)
BUN: 11 mg/dL (ref 8–23)
CO2: 25 mmol/L (ref 22–32)
Calcium: 8.9 mg/dL (ref 8.9–10.3)
Chloride: 93 mmol/L — ABNORMAL LOW (ref 98–111)
Creatinine: 1.18 mg/dL (ref 0.61–1.24)
GFR, Estimated: 59 mL/min — ABNORMAL LOW (ref >60)
Glucose, Bld: 129 mg/dL — ABNORMAL HIGH (ref 70–99)
Potassium: 4.3 mmol/L (ref 3.5–5.1)
Sodium: 131 mmol/L — ABNORMAL LOW (ref 135–145)
Total Bilirubin: 1.2 mg/dL (ref 0.3–1.2)
Total Protein: 6.5 g/dL (ref 6.5–8.1)

## 2020-11-24 LAB — CBC WITH DIFFERENTIAL (CANCER CENTER ONLY)
Abs Immature Granulocytes: 0.1 10*3/uL — ABNORMAL HIGH (ref 0.00–0.07)
Basophils Absolute: 0.1 10*3/uL (ref 0.0–0.1)
Basophils Relative: 0 %
Eosinophils Absolute: 0.1 10*3/uL (ref 0.0–0.5)
Eosinophils Relative: 0 %
HCT: 40.7 % (ref 39.0–52.0)
Hemoglobin: 13.5 g/dL (ref 13.0–17.0)
Immature Granulocytes: 0 %
Lymphocytes Relative: 92 %
Lymphs Abs: 68.7 10*3/uL — ABNORMAL HIGH (ref 0.7–4.0)
MCH: 32.8 pg (ref 26.0–34.0)
MCHC: 33.2 g/dL (ref 30.0–36.0)
MCV: 98.8 fL (ref 80.0–100.0)
Monocytes Absolute: 2.5 10*3/uL — ABNORMAL HIGH (ref 0.1–1.0)
Monocytes Relative: 3 %
Neutro Abs: 3.4 10*3/uL (ref 1.7–7.7)
Neutrophils Relative %: 5 %
Platelet Count: 90 10*3/uL — ABNORMAL LOW (ref 150–400)
RBC: 4.12 MIL/uL — ABNORMAL LOW (ref 4.22–5.81)
RDW: 14.1 % (ref 11.5–15.5)
WBC Count: 74.8 10*3/uL (ref 4.0–10.5)
nRBC: 0 % (ref 0.0–0.2)

## 2020-11-24 LAB — LACTATE DEHYDROGENASE: LDH: 172 U/L (ref 98–192)

## 2020-11-24 NOTE — Progress Notes (Unsigned)
CRITICAL VALUE STICKER  CRITICAL VALUE:WBC 74.8  DATE & TIME NOTIFIED: 11/24/20 12:00  MD NOTIFIED: Dr Irene Limbo  TIME OF NOTIFICATION:12:06

## 2020-11-26 ENCOUNTER — Other Ambulatory Visit: Payer: Self-pay | Admitting: Physician Assistant

## 2020-11-26 ENCOUNTER — Telehealth: Payer: Self-pay | Admitting: Physician Assistant

## 2020-11-26 DIAGNOSIS — C9110 Chronic lymphocytic leukemia of B-cell type not having achieved remission: Secondary | ICD-10-CM

## 2020-11-26 NOTE — Telephone Encounter (Signed)
Called pt about Evusheld (tixagevimab co-packaged with cilgavimab) for pre-exposure prophylaxis for prevention of coronavirus disease 2019 (COVID-19) caused by the SARS-CoV-2 virus. The patient is a candidate for this therapy given increased risk for severe disease caused by immunosuppression.    Unable to reach pt- left Vm and mychart   Angelena Form PA-C  [facility]

## 2020-11-26 NOTE — Progress Notes (Signed)
I connected by phone with Thomas Zavala on 11/26/2020, 6:08 PM to discuss the potential use of a new treatment, tixagevimab/cilgavimab, for pre-exposure prophylaxis for prevention of coronavirus disease 2019 (COVID-19) caused by the SARS-CoV-2 virus.  This patient is a 87 y.o. male that meets the FDA criteria for Emergency Use Authorization of tixagevimab/cilgavimab for pre-exposure prophylaxis of COVID-19 disease. Pt meets following criteria:  Age >12 yr and weight > 40kg  Not currently infected with SARS-CoV-2 and has no known recent exposure to an individual infected with SARS-CoV-2 AND o Who has moderate to severe immune compromise due to a medical condition or receipt of immunosuppressive medications or treatments and may not mount an adequate immune response to COVID-19 vaccination or  o Vaccination with any available COVID-19 vaccine, according to the approved or authorized schedule, is not recommended due to a history of severe adverse reaction (e.g., severe allergic reaction) to a COVID-19 vaccine(s) and/or COVID-19 vaccine component(s).  o Patient meets the following definition of mod-severe immune compromised status: 6. Other actively treated hematologic malignancies or severe congenital immunodeficiency syndromes  I have spoken and communicated the following to the patient or parent/caregiver regarding COVID monoclonal antibody treatment:  1. FDA has authorized the emergency use of tixagevimab/cilgavimab for the pre-exposure prophylaxis of COVID-19 in patients with moderate-severe immunocompromised status, who meet above EUA criteria.  2. The significant known and potential risks and benefits of COVID monoclonal antibody, and the extent to which such potential risks and benefits are unknown.  3. Information on available alternative treatments and the risks and benefits of those alternatives, including clinical trials.  4. The patient or parent/caregiver has the option to accept or refuse  COVID monoclonal antibody treatment.  After reviewing this information with the patient, agree to receive tixagevimab/cilgavimab  Angelena Form, PA-C, 11/26/2020, 6:08 PM

## 2020-11-27 ENCOUNTER — Telehealth: Payer: Self-pay | Admitting: Hematology

## 2020-11-27 NOTE — Telephone Encounter (Signed)
Left message with upcoming injection appointment per 4/10 schedule message and rescheduled upcoming appointment due to provider's PAL. Gave option to call back to reschedule if needed.

## 2020-11-28 ENCOUNTER — Other Ambulatory Visit: Payer: Self-pay

## 2020-11-28 ENCOUNTER — Inpatient Hospital Stay: Payer: PPO

## 2020-11-28 DIAGNOSIS — C9110 Chronic lymphocytic leukemia of B-cell type not having achieved remission: Secondary | ICD-10-CM | POA: Diagnosis not present

## 2020-11-28 DIAGNOSIS — Z87891 Personal history of nicotine dependence: Secondary | ICD-10-CM | POA: Diagnosis not present

## 2020-11-28 DIAGNOSIS — C434 Malignant melanoma of scalp and neck: Secondary | ICD-10-CM | POA: Diagnosis not present

## 2020-11-28 DIAGNOSIS — Z298 Encounter for other specified prophylactic measures: Secondary | ICD-10-CM | POA: Diagnosis not present

## 2020-11-28 MED ORDER — TIXAGEVIMAB (PART OF EVUSHELD) INJECTION
300.0000 mg | Freq: Once | INTRAMUSCULAR | Status: AC
  Administered 2020-11-28: 300 mg via INTRAMUSCULAR
  Filled 2020-11-28: qty 3

## 2020-11-28 MED ORDER — CILGAVIMAB (PART OF EVUSHELD) INJECTION
300.0000 mg | Freq: Once | INTRAMUSCULAR | Status: AC
  Administered 2020-11-28: 300 mg via INTRAMUSCULAR
  Filled 2020-11-28: qty 3

## 2020-12-07 ENCOUNTER — Ambulatory Visit: Payer: PPO | Admitting: Hematology

## 2020-12-07 ENCOUNTER — Other Ambulatory Visit: Payer: PPO

## 2020-12-08 DIAGNOSIS — R310 Gross hematuria: Secondary | ICD-10-CM | POA: Diagnosis not present

## 2020-12-08 DIAGNOSIS — N400 Enlarged prostate without lower urinary tract symptoms: Secondary | ICD-10-CM | POA: Diagnosis not present

## 2020-12-21 DIAGNOSIS — Z23 Encounter for immunization: Secondary | ICD-10-CM | POA: Diagnosis not present

## 2020-12-22 ENCOUNTER — Other Ambulatory Visit: Payer: PPO

## 2020-12-22 ENCOUNTER — Ambulatory Visit: Payer: PPO | Admitting: Hematology

## 2021-01-01 DIAGNOSIS — D0462 Carcinoma in situ of skin of left upper limb, including shoulder: Secondary | ICD-10-CM | POA: Diagnosis not present

## 2021-01-01 DIAGNOSIS — C434 Malignant melanoma of scalp and neck: Secondary | ICD-10-CM | POA: Diagnosis not present

## 2021-01-01 DIAGNOSIS — X32XXXD Exposure to sunlight, subsequent encounter: Secondary | ICD-10-CM | POA: Diagnosis not present

## 2021-01-01 DIAGNOSIS — L570 Actinic keratosis: Secondary | ICD-10-CM | POA: Diagnosis not present

## 2021-01-09 DIAGNOSIS — H52223 Regular astigmatism, bilateral: Secondary | ICD-10-CM | POA: Diagnosis not present

## 2021-01-09 DIAGNOSIS — H524 Presbyopia: Secondary | ICD-10-CM | POA: Diagnosis not present

## 2021-01-09 DIAGNOSIS — H2513 Age-related nuclear cataract, bilateral: Secondary | ICD-10-CM | POA: Diagnosis not present

## 2021-01-09 DIAGNOSIS — E119 Type 2 diabetes mellitus without complications: Secondary | ICD-10-CM | POA: Diagnosis not present

## 2021-01-09 DIAGNOSIS — Z85820 Personal history of malignant melanoma of skin: Secondary | ICD-10-CM | POA: Diagnosis not present

## 2021-01-09 DIAGNOSIS — H35033 Hypertensive retinopathy, bilateral: Secondary | ICD-10-CM | POA: Diagnosis not present

## 2021-01-11 ENCOUNTER — Telehealth: Payer: Self-pay | Admitting: Hematology

## 2021-01-11 NOTE — Telephone Encounter (Signed)
Pt's daughter called and requested to r/s appt to 5/31 earliest possible because she will be leaving town that week and she said she needs to be present for the appt. I r/s appt accordingly. I did let her know they may be a bit of wait that morning because of overbooks, she verbalized her understanding.

## 2021-01-11 NOTE — Telephone Encounter (Signed)
Scheduled appt per 5/25 sch msg. Pt aware.  

## 2021-01-15 NOTE — Progress Notes (Signed)
HEMATOLOGY/ONCOLOGY CLINIC  Date of Service: 01/16/2021  Patient Care Team: Corrington, Delsa Grana, MD as PCP - General (Family Medicine)  CHIEF COMPLAINTS/PURPOSE OF CONSULTATION:  Chronic Lymphocytic Leukemia Recently diagnosed Melanoma  HISTORY OF PRESENTING ILLNESS:   Thomas Zavala is a wonderful 87 y.o. male who has been referred to [place] by Bradly Bienenstock, NP for evaluation and management of his Chronic Lymphocytic Leukemia. The patient has been seen by Dr. Murriel Hopper in the past. He is accompanied today by his daughter. The pt reports that he is doing well overall.   The pt reports that he had ease of bruising in 2017 at which time he sought out a physical and was observed to have lymphocytosis and was diagnosed with CLL in November 2017. The pt denies having any other symptoms at that time. The pt has not had treatment for his CLL.  The pt notes that he has had some sweating at night between his legs, and feelings of flushness, which he notes started "in the last week or so." The pt denies fevers, chills, and noticing any new lumps or bumps. He has had recurrent squamous cell carcinomas for "several years," in sun-exposed areas. He follows with his dermatologist regularly for skin screenings.  The pt notes that his energy levels have diminished somewhat over the last 3 years, but denies fatigue being a limiting factor. He recently went on a 2 mile hike with his daughter. He has a one level living arrangement. The pt denies frequent or recurrent infections.   The pt notes that he follows with Dr. Gloriann Loan in urology for occasional hematuria. He notes that his DM and blood pressure have been well controlled.   The pt notes that he has had both of his pneumonia vaccines in the last 5 years and received his flu shot this season.  Most recent lab results (10/21/18) of CBC w/diff is as follows: all values are WNL except for WBC at WBC at 51.5k, PLT at 73k, Lymphs abs at 46.5k, Monocytes abs at  1.9k. 10/21/18 CMP is pending 10/21/18 LDH is pending  On review of systems, pt reports staying active, eating well, mildly lower energy levels, and denies drenching night sweats, fevers, new fatigue, chills, unexpected weight loss, frequent infections, recurrent infections, leg swelling, abdominal pains, and any other symptoms.  On PMHx the pt reports CLL, DM type II.   INTERVAL HISTORY:   Thomas Zavala returns today for management and evaluation of his CLL. The patient's last visit with [place] was on 11/24/2020. The pt reports that he is doing well overall. We are joined today by his daughter.  The pt reports that he saw his dermatologist around last week and they found a spot of recurrent melanoma on his scalp that appeared metastatic. He has not been experiencing any new symptoms. He does several skin lesions in addition to the biopsied dominant lesion that could represent satellite melanoma lesions vs non melanoma skin cancers.  On review of systems, pt reports recurrent melanoma spots on head, leg swelling and denies fevers, chills, abdominal pain, and any other symptoms.  MEDICAL HISTORY:  Past Medical History:  Diagnosis Date  . Abdominal hernia   . BPH (benign prostatic hyperplasia) 07/25/2016  . CLL (chronic lymphocytic leukemia) ([place])   . Hypertension   . Melanoma of forehead ([place])   . Melanoma of scalp ([place])   . PVC's (premature ventricular contractions)   . Type 2 diabetes mellitus without complication, without long-term current use of insulin (  [place]) 07/25/2016    SURGICAL HISTORY: Past Surgical History:  Procedure Laterality Date  . EYE SURGERY     eye lids  . IRRIGATION AND DEBRIDEMENT OF WOUND WITH SPLIT THICKNESS SKIN GRAFT Left 01/05/2020   Procedure: SPLIT THICKNESS SKIN GRAFT Left shoulder;  Surgeon: Wallace Going, DO;  Location: [facility];  Service: Plastics;  Laterality: Left;  [name] MASS EXCISION N/A 01/05/2020   Procedure: EXCISION OF FOREHEAD  MELANOMA;  Surgeon:  Wallace Going, DO;  Location: [facility];  Service: Plastics;  Laterality: N/A;  . PAROTIDECTOMY Left 01/05/2020   Procedure: PAROTIDECTOMY;  Surgeon: Izora Gala, MD;  Location: [facility];  Service: ENT;  Laterality: Left;  . PROSTATE SURGERY    . SENTINEL NODE BIOPSY Left 01/05/2020   Procedure: Sentinel Node Biopsy;  Surgeon: Izora Gala, MD;  Location: [facility];  Service: ENT;  Laterality: Left;    SOCIAL HISTORY: Social History   Socioeconomic History  . Marital status: Widowed    Spouse name: Not on file  . Number of children: 3  . Years of education: Not on file  . Highest education level: Not on file  Occupational History  . Not on file  Tobacco Use  . Smoking status: Former Smoker    Packs/day: 1.00    Years: 25.00    Pack years: 25.00    Types: Cigarettes    Quit date: 1970    Years since quitting: 52.4  . Smokeless tobacco: Never Used  . Tobacco comment: quit in the 70's  Vaping Use  . Vaping Use: Never used  Substance and Sexual Activity  . Alcohol use: Yes    Comment: socially.  . Drug use: No  . Sexual activity: Not on file  Other Topics Concern  . Not on file  Social History Narrative  . Not on file   Social Determinants of Health   Financial Resource Strain: Not on file  Food Insecurity: Not on file  Transportation Needs: Not on file  Physical Activity: Not on file  Stress: Not on file  Social Connections: Not on file  Intimate Partner Violence: Not on file    FAMILY HISTORY: Family History  Problem Relation Age of Onset  . Hypertension Father     ALLERGIES:  is allergic to fluorouracil.  MEDICATIONS:  Current Outpatient Medications  Medication Sig Dispense Refill  . finasteride (PROSCAR) 5 MG tablet Take 5 mg by mouth daily.    [name] losartan-hydrochlorothiazide (HYZAAR) 100-25 MG tablet Take 1 tablet by mouth daily.     . metFORMIN (GLUCOPHAGE-XR) 500 MG 24 hr tablet Take 500 mg by mouth daily.     . metoprolol tartrate (LOPRESSOR) 50 MG  tablet Take 1 tablet (50 mg total) by mouth 3 (three) times daily. 270 tablet 3  . pravastatin (PRAVACHOL) 40 MG tablet Take 40 mg by mouth daily.     . Carboxymethylcellul-Glycerin (LUBRICATING EYE DROPS OP) Place 1 drop into both eyes daily as needed (dry eyes).    . carboxymethylcellul-glycerin (REFRESH OPTIVE) 0.5-0.9 % ophthalmic solution Apply to eye.    . cephALEXin (KEFLEX) 500 MG capsule     . FLUZONE HIGH-DOSE QUADRIVALENT 0.7 ML SUSY     . glucose blood (ONETOUCH ULTRA) test strip OneTouch Ultra Blue Test Strip  USE TO TEST BLOOD SUGAR DAILY AS INSTRUCTED    . glucose blood test strip OneTouch Ultra Blue Test Strip  USE TO TEST BLOOD SUGAR DAILY AS INSTRUCTED    . LORazepam (ATIVAN) 0.5  MG tablet Take 1-2 tablets (0.5-1 mg total) by mouth once as needed for up to 1 dose for anxiety. 30 -45 mins prior to MRI or CT scan for claustrophobia 2 tablet 0  . mupirocin ointment (BACTROBAN) 2 % Apply topically 2 (two) times daily.    [name] neomycin-polymyxin b-dexamethasone (MAXITROL) 3.5-10000-0.1 OINT     . tamsulosin (FLOMAX) 0.4 MG CAPS capsule Take 0.4 mg by mouth daily.     No current facility-administered medications for this visit.    REVIEW OF SYSTEMS:   10 Point review of Systems was done is negative except as noted above.  PHYSICAL EXAMINATION: ECOG PERFORMANCE STATUS: 2 - Symptomatic, <50% confined to bed  . Vitals:   01/16/21 0908  BP: (!) 170/57  Pulse: (!) 58  Resp: 18  Temp: 97.6 F (36.4 C)  SpO2: 99%   Filed Weights   01/16/21 0908  Weight: 185 lb 8 oz (84.1 kg)   Body mass index is 25.87 kg/m.   Exam was given in a chair.   GENERAL:alert, in no acute distress and comfortable SKIN: no acute rashes, melanomas on head region and forehead. EYES: conjunctiva are pink and non-injected, sclera anicteric OROPHARYNX: MMM, no exudates, no oropharyngeal erythema or ulceration NECK: supple, no JVD LYMPH:  no palpable lymphadenopathy in the cervical, axillary or  inguinal regions LUNGS: clear to auscultation b/l with normal respiratory effort HEART: regular rate & rhythm ABDOMEN:  normoactive bowel sounds , non tender, not distended. Extremity: trace pedal edema PSYCH: alert & oriented x 3 with fluent speech NEURO: no focal motor/sensory deficits  LABORATORY DATA:  I have reviewed the data as listed  . CBC Latest Ref Rng & Units 11/24/2020 08/31/2020 04/14/2020  WBC 4.0 - 10.5 K/uL 74.8(HH) 92.5(HH) 64.5(HH)  Hemoglobin 13.0 - 17.0 g/dL 13.5 13.6 13.8  Hematocrit 39.0 - 52.0 % 40.7 40.7 41.4  Platelets 150 - 400 K/uL 90(L) 69(L) 76(L)    . CMP Latest Ref Rng & Units 11/24/2020 08/31/2020 04/14/2020  Glucose 70 - 99 mg/dL 129(H) 139(H) 152(H)  BUN 8 - 23 mg/dL 11 11 12   Creatinine 0.61 - 1.24 mg/dL 1.18 1.13 1.12  Sodium 135 - 145 mmol/L 131(L) 133(L) 131(L)  Potassium 3.5 - 5.1 mmol/L 4.3 3.3(L) 3.6  Chloride 98 - 111 mmol/L 93(L) 92(L) 94(L)  CO2 22 - 32 mmol/L 25 31 28   Calcium 8.9 - 10.3 mg/dL 8.9 9.1 9.6  Total Protein 6.5 - 8.1 g/dL 6.5 6.2(L) 6.1(L)  Total Bilirubin 0.3 - 1.2 mg/dL 1.2 1.0 1.0  Alkaline Phos 38 - 126 U/L 118 107 89  AST 15 - 41 U/L 18 18 18   ALT 0 - 44 U/L 11 11 8     07/15/16 Cytogenetics and Flow Cytometry:   11/12/19 of Molecular Pathology   01/05/20 of Surgical Pathology Report [id number])  RADIOGRAPHIC STUDIES: I have personally reviewed the radiological images as listed and agreed with the findings in the report. No results found.   ASSESSMENT & PLAN:  87 y.o. male with  1. Chronic Lymphocytic Leukemia Diagnosed in November 2017 with flow cytometry 07/15/16 Cytogenetics revealed Trisomy 12  09/03/16 CT A/P revealed Borderline splenomegaly with 1.3 cm low-attenuation lesion in the anterior aspect of spleen. Mild upper abdominal and right iliac lymphadenopathy. 4 mm indeterminate pulmonary nodule in right lung base. Recommend continued attention on follow-up CT. Incidentally noted benign right  hepatic lobe hemangioma, mildly and enlarged prostate, and colonic diverticulosis  10/10/17 CT Chest revealed Scattered small bilateral pulmonary nodules,  including two dominant right middle lobe nodules measuring 5-6 mm, unchanged, likely benign. If clinically warranted, consider a single follow-up CT chest in 1 year. Mild mediastinal and bilateral hilar lymphadenopathy, likely related to the patient's known CLL. Mild splenomegaly, incompletely visualized. Aortic Atherosclerosis and Emphysema  2. 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  patient declined adjuvant immunotheraphy  PLAN: -Advised pt that these spots were metastatic or satellite lesions that is an acute finding. -Discussed typical options for treatment and next steps-- repeat PET scan. Can have pt just go to the surgeon for removal if desired. -Advised pt that melanomas can behave very fast and may not show up on the scan from March. This would show if the spots are elsewhere in the body. -Advised pt that even if they remove it, it will show up somewhere else. We just do not know how quickly. -Discussed treatment options and pt's goals of care for how aggressive to treat this. Discussed surgeon removing as many as possible cosmetically versus considering systemic therapies. -Given pt's known Stage 3 diease, we knew the recurrence risk was high. These spots on newest dermatological evaluation are new spots from the forehead and not life threatening. This can change to threaten his organs, but we do not know this at this time. -Advised pt if no other disease, we treat satellite spots with local surgeries. -Advised pt that unlike other skin cancers, melanomas can erode underlying skin tissue. -Discussed Pembrolizumab immunotherapy. Advised pt this would be once every 3 weeks and would be to keep the disease controlled. This is not curative.  -Discussed systemic therapies and  reliance on immune system. Advised pt that the CLL suppresses his immune system and there is not much data on response rates with CLL and given pt's age. -Discussed second opinion. Advised pt we would recommend he see Dr. Arville Lime at [facility] due to his specialty in melanomas. -Discussed burden of care as related to quality of life and length of life. Advised pt we want to maintain this disease without compromising or decomposing his health further. -Will connect pt w Dr. Marla Roe for surgical removal. -Will see back in 2 months. Will try to get repeat PET scan prior to this visit if insurance approves.   FOLLOW UP: Referral to Dr. Marla Roe for recurrent melanoma on the scalp PET scan in 7 weeks Return to clinic with Dr. Irene Limbo with labs in 2 months    The total time spent in the appointment was 40 minutes and more than 50% was on counseling and direct patient cares.  All of the patient's questions were answered with apparent satisfaction. The patient knows to call the clinic with any problems, questions or concerns.   Sullivan Lone MD [place] AAHIVMS [facility] Hematology/Oncology Physician [facility]  (Office):       [phone] (Work cell):  [phone] (Fax):           [phone]  01/16/2021 10:20 AM  I, Reinaldo Raddle, am acting as scribe for Dr. Sullivan Lone, MD.

## 2021-01-16 ENCOUNTER — Inpatient Hospital Stay: Payer: PPO | Attending: Family Medicine | Admitting: Hematology

## 2021-01-16 ENCOUNTER — Other Ambulatory Visit: Payer: Self-pay

## 2021-01-16 VITALS — BP 170/57 | HR 58 | Temp 97.6°F | Resp 18 | Wt 185.5 lb

## 2021-01-16 DIAGNOSIS — C9110 Chronic lymphocytic leukemia of B-cell type not having achieved remission: Secondary | ICD-10-CM | POA: Insufficient documentation

## 2021-01-16 DIAGNOSIS — Z87891 Personal history of nicotine dependence: Secondary | ICD-10-CM | POA: Insufficient documentation

## 2021-01-16 DIAGNOSIS — M7989 Other specified soft tissue disorders: Secondary | ICD-10-CM | POA: Insufficient documentation

## 2021-01-16 DIAGNOSIS — C439 Malignant melanoma of skin, unspecified: Secondary | ICD-10-CM | POA: Diagnosis not present

## 2021-01-16 DIAGNOSIS — C434 Malignant melanoma of scalp and neck: Secondary | ICD-10-CM | POA: Diagnosis not present

## 2021-01-17 ENCOUNTER — Ambulatory Visit: Payer: PPO | Admitting: Hematology

## 2021-01-26 ENCOUNTER — Encounter: Payer: Self-pay | Admitting: Plastic Surgery

## 2021-01-26 ENCOUNTER — Other Ambulatory Visit: Payer: Self-pay

## 2021-01-26 ENCOUNTER — Ambulatory Visit: Payer: PPO | Admitting: Plastic Surgery

## 2021-01-26 VITALS — BP 175/84 | HR 68 | Temp 98.0°F | Ht 69.0 in | Wt 185.0 lb

## 2021-01-26 DIAGNOSIS — C439 Malignant melanoma of skin, unspecified: Secondary | ICD-10-CM

## 2021-01-26 NOTE — Progress Notes (Signed)
[name], male    DOB: 02/27/1928, 87 y.o.   MRN: [id number]   Chief Complaint  Patient presents with   Skin Problem    The patient is a 87 year old male here with his daughter for evaluation of a melanoma of his forehead.  He is in close communication with dermatology.  He has had melanomas in the past and he is known to have metastatic melanoma.  After a long discussion with the family they want to be conservative.  He already underwent a left-sided neck dissection with Dr. Constance Holster and myself for a previous forehead melanoma.  They do not want to go through that again.  The area is about 1 x 2 cm on the scalp just to the right of midline.  There does not appear to be any sign of infection.  He also has a changing skin lesion on his left cheek.  He had a biopsy of the scalp which showed breast low thickness 3.0 mm with a Clark level IV.  It was a shave biopsy.  Lymphovascular invasion is present.   Review of Systems  Constitutional: Negative.   HENT: Negative.    Eyes: Negative.   Respiratory: Negative.  Negative for chest tightness and shortness of breath.   Gastrointestinal: Negative.   Genitourinary: Negative.   Musculoskeletal: Negative.   Skin:  Positive for wound.  Neurological: Negative.   Hematological: Negative.   Psychiatric/Behavioral: Negative.     Past Medical History:  Diagnosis Date   Abdominal hernia    BPH (benign prostatic hyperplasia) 07/25/2016   CLL (chronic lymphocytic leukemia) ([place])    Hypertension    Melanoma of forehead ([place])    Melanoma of scalp ([place])    PVC's (premature ventricular contractions)    Type 2 diabetes mellitus without complication, without long-term current use of insulin ([place]) 07/25/2016    Past Surgical History:  Procedure Laterality Date   EYE SURGERY     eye lids   IRRIGATION AND DEBRIDEMENT OF WOUND WITH SPLIT THICKNESS SKIN GRAFT Left 01/05/2020   Procedure: SPLIT THICKNESS SKIN GRAFT Left shoulder;  Surgeon:  Wallace Going, DO;  Location: [facility];  Service: Plastics;  Laterality: Left;   MASS EXCISION N/A 01/05/2020   Procedure: EXCISION OF FOREHEAD  MELANOMA;  Surgeon: Wallace Going, DO;  Location: [facility];  Service: Plastics;  Laterality: N/A;   PAROTIDECTOMY Left 01/05/2020   Procedure: PAROTIDECTOMY;  Surgeon: Izora Gala, MD;  Location: [facility];  Service: ENT;  Laterality: Left;   PROSTATE SURGERY     SENTINEL NODE BIOPSY Left 01/05/2020   Procedure: Sentinel Node Biopsy;  Surgeon: Izora Gala, MD;  Location: [facility];  Service: ENT;  Laterality: Left;      Current Outpatient Medications:    Carboxymethylcellul-Glycerin (LUBRICATING EYE DROPS OP), Place 1 drop into both eyes daily as needed (dry eyes)., Disp: , Rfl:    carboxymethylcellul-glycerin (REFRESH OPTIVE) 0.5-0.9 % ophthalmic solution, Apply to eye., Disp: , Rfl:    finasteride (PROSCAR) 5 MG tablet, Take 5 mg by mouth daily., Disp: , Rfl:    FLUZONE HIGH-DOSE QUADRIVALENT 0.7 ML SUSY, , Disp: , Rfl:    glucose blood (ONETOUCH ULTRA) test strip, OneTouch Ultra Blue Test Strip  USE TO TEST BLOOD SUGAR DAILY AS INSTRUCTED, Disp: , Rfl:    glucose blood test strip, OneTouch Ultra Blue Test Strip  USE TO TEST BLOOD SUGAR DAILY AS INSTRUCTED, Disp: , Rfl:    LORazepam (ATIVAN) 0.5  MG tablet, Take 1-2 tablets (0.5-1 mg total) by mouth once as needed for up to 1 dose for anxiety. 30 -45 mins prior to MRI or CT scan for claustrophobia, Disp: 2 tablet, Rfl: 0   losartan-hydrochlorothiazide (HYZAAR) 100-25 MG tablet, Take 1 tablet by mouth daily. , Disp: , Rfl:    metFORMIN (GLUCOPHAGE-XR) 500 MG 24 hr tablet, Take 500 mg by mouth daily. , Disp: , Rfl:    metoprolol tartrate (LOPRESSOR) 50 MG tablet, Take 1 tablet (50 mg total) by mouth 3 (three) times daily., Disp: 270 tablet, Rfl: 3   mupirocin ointment (BACTROBAN) 2 %, Apply topically 2 (two) times daily., Disp: , Rfl:    neomycin-polymyxin b-dexamethasone (MAXITROL) 3.5-10000-0.1 OINT,  , Disp: , Rfl:    pravastatin (PRAVACHOL) 40 MG tablet, Take 40 mg by mouth daily. , Disp: , Rfl:    tamsulosin (FLOMAX) 0.4 MG CAPS capsule, Take 0.4 mg by mouth daily., Disp: , Rfl:    Objective:   Vitals:   01/26/21 0858  BP: (!) 175/84  Pulse: 68  Temp: 98 F (36.7 C)  SpO2: 97%    Physical Exam Vitals and nursing note reviewed.  Constitutional:      Appearance: Normal appearance.  HENT:     Head: Normocephalic.     Nose: Nose normal.  Eyes:     Pupils: Pupils are equal, round, and reactive to light.  Cardiovascular:     Rate and Rhythm: Normal rate.     Pulses: Normal pulses.  Pulmonary:     Effort: Pulmonary effort is normal.  Abdominal:     General: Abdomen is flat.  Skin:    General: Skin is warm.     Capillary Refill: Capillary refill takes less than 2 seconds.     Findings: Lesion present. No bruising.  Neurological:     Mental Status: He is alert. Mental status is at baseline.  Psychiatric:        Mood and Affect: Mood normal.    Assessment & Plan:  Melanoma of skin ([place])  Recommend excision with possible ACell or Maitri Derm placement and possible skin graft.  The family would like as much done at one time as possible but also to be conservative without any lymph node involvement.  I think this is reasonable given his overall condition and the extent of the disease.  They know its an option to do nothing but they do not want him to have lesions that become problematic in the future from a skin standpoint.  [place], DO

## 2021-01-29 DIAGNOSIS — L570 Actinic keratosis: Secondary | ICD-10-CM | POA: Diagnosis not present

## 2021-01-29 DIAGNOSIS — X32XXXD Exposure to sunlight, subsequent encounter: Secondary | ICD-10-CM | POA: Diagnosis not present

## 2021-01-29 DIAGNOSIS — Z08 Encounter for follow-up examination after completed treatment for malignant neoplasm: Secondary | ICD-10-CM | POA: Diagnosis not present

## 2021-01-29 DIAGNOSIS — Z85820 Personal history of malignant melanoma of skin: Secondary | ICD-10-CM | POA: Diagnosis not present

## 2021-02-08 ENCOUNTER — Other Ambulatory Visit: Payer: Self-pay

## 2021-02-08 ENCOUNTER — Encounter: Payer: Self-pay | Admitting: Surgical

## 2021-02-08 ENCOUNTER — Ambulatory Visit (INDEPENDENT_AMBULATORY_CARE_PROVIDER_SITE_OTHER): Payer: PPO | Admitting: Surgical

## 2021-02-08 VITALS — BP 176/81 | HR 65 | Ht 71.0 in | Wt 183.2 lb

## 2021-02-08 DIAGNOSIS — E119 Type 2 diabetes mellitus without complications: Secondary | ICD-10-CM

## 2021-02-08 DIAGNOSIS — C9110 Chronic lymphocytic leukemia of B-cell type not having achieved remission: Secondary | ICD-10-CM

## 2021-02-08 DIAGNOSIS — C439 Malignant melanoma of skin, unspecified: Secondary | ICD-10-CM

## 2021-02-08 MED ORDER — TRAMADOL HCL 50 MG PO TABS: 50.0000 mg | ORAL_TABLET | Freq: Four times a day (QID) | ORAL | 0 refills | Status: AC | PRN

## 2021-02-08 MED ORDER — CEPHALEXIN 500 MG PO CAPS: 500.0000 mg | ORAL_CAPSULE | Freq: Four times a day (QID) | ORAL | 0 refills | Status: AC

## 2021-02-08 MED ORDER — ONDANSETRON HCL 4 MG PO TABS: 4.0000 mg | ORAL_TABLET | Freq: Three times a day (TID) | ORAL | 0 refills | Status: DC | PRN
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
## 2021-02-13 ENCOUNTER — Other Ambulatory Visit: Payer: Self-pay

## 2021-02-13 ENCOUNTER — Encounter (HOSPITAL_COMMUNITY): Payer: Self-pay | Admitting: Plastic Surgery

## 2021-02-13 NOTE — Progress Notes (Signed)
I spoke to Thomas Zavala with his daughter, Thomas Zavala on the phone and taking notes.  Thomas Zavala denies chest pain or shortness of breath.  Thomas Zavala denies palpations. Thomas Zavala denies any further episode of  'loss of consciousness', or syncope since the episode he had on 07/09/20.  Thomas Zavala informed his PCP at [facility] [place] on 07/11/20.    Thomas Zavala denies any s/s of Covid in his home and is unaware of any exposures.   Thomas Zavala has type II diabetes.  Patient does not check CBGs, ( he has a monitor), Hemoglobin A1C at well check up 07/11/20 was 6.0, in 2020 it was 5.5.  I instructed patient to hold Metformin in am.  I encouraged Thomas Zavala to check CBG when he wakes up in am and every 2 hours until he leaves to come to the hospital. I Instructed patient if CBG is less than 70 to take 4 Glucose Tablets or 1 tube of Glucose Gel or 1/2 cup of a clear juice. Recheck CBG in 15 minutes if CBG is not over 70 call, pre- op desk at [phone] for further instructions.

## 2021-02-13 NOTE — Progress Notes (Addendum)
Anesthesia Chart Review:  Pt is a same day work up   Case: [id number] Date/Time: 02/14/21 1545   Procedures:      Excision of left cheek changing lesion (Left: Face) - 90 min     Excision of forehead melanoma (Face)     Possible placement of ACell and Maitri Derm (Face)     Possible skin graft (Face)   Anesthesia type: General   Pre-op diagnosis: Melanoma of skin   Location: [facility] OR ROOM 09 / [place] OR   Surgeons: Wallace Going, DO       DISCUSSION: Pt is 87 years old with hx HTN, PVCs, DM, CLL, metastatic melanoma  - New 2/6 midsystolic heart murmur identified at last cardiology office visit 06/15/20; pt offered echo to evaluate but declined due to his age and feeling echo wouldn't change his management.   - Pt had LOC episode 07/09/20 - from notes by PCP office in care everywhere at office visit 07/11/20: "He had an episode of loss of consciousness/unresponsiveness on Sunday. He had 2 alcoholic beverages prior to eating dinner for his birthday. He had been standing for a while prior to the episode.He sat down to eat at 7pm. Had a banana earlier in the day. He felt nauseated prior to episode. He did not feel his heart race. He was sitting across from daughter. He was slumping down at table. He almost put his head on table. His eyes were open, mouth was loose. Daughter felt he was having a stroke. He didn't appear to be in pain. Daughter felt stroke because of his posture. He was mumbled when he tried to talk. He could hear them talking to him but could not speak. They straightened him up. They asked him to repeat a sentence. First time he couldn't. Second time he said the sentence garbled. He was pale. He appeared to gag like he was going to throw up. Vomited a small amount of partially digested food. EMTs came and checked vitals. Talking to him and asking questions. He did not want to go to hospital. Within 5 mins he was completely lucid. Did not go in for evaluation.Has not reported episode to  his cardiologist." - Pt reports to pre-admission testing nurse today that he has not experienced any further syncopal episodes since this event   PROVIDERS: - PCP is Corrington, Delsa Grana, MD -Oncologist is Sullivan Lone, MD - [facility] cardiology care at [facility]. Last office visit 06/15/20 with Lawerance Cruel, PA  LABS: Will be obtained day of surgery  - Pt typically has leukocytosis (WBC 49-92 over last year) - Pt typically has thrombocytopenia (plts 64-90 over last year)  EKG 06/15/20: Sinus bradycardia at a rate of 54 bpm with first-degree AV block and blocked PAC.  Normal axis.  No evidence of ischemia.    CV: N/A  Past Medical History:  Diagnosis Date   Abdominal hernia    BPH (benign prostatic hyperplasia) 07/25/2016   CLL (chronic lymphocytic leukemia) (HCC)    Heart murmur    Hypertension    Melanoma of forehead ([place])    Melanoma of scalp (HCC)    PVC's (premature ventricular contractions)    Type 2 diabetes mellitus without complication, without long-term current use of insulin ([place]) 07/25/2016    Past Surgical History:  Procedure Laterality Date   EYE SURGERY     eye lids   IRRIGATION AND DEBRIDEMENT OF WOUND WITH SPLIT THICKNESS SKIN GRAFT Left 01/05/2020   Procedure: SPLIT THICKNESS SKIN GRAFT  Left shoulder;  Surgeon: Wallace Going, DO;  Location: [place];  Service: Plastics;  Laterality: Left;   MASS EXCISION N/A 01/05/2020   Procedure: EXCISION OF FOREHEAD  MELANOMA;  Surgeon: Wallace Going, DO;  Location: [place];  Service: Plastics;  Laterality: N/A;   PAROTIDECTOMY Left 01/05/2020   Procedure: PAROTIDECTOMY;  Surgeon: Izora Gala, MD;  Location: [place];  Service: ENT;  Laterality: Left;   PROSTATE SURGERY     SENTINEL NODE BIOPSY Left 01/05/2020   Procedure: Sentinel Node Biopsy;  Surgeon: Izora Gala, MD;  Location: [place];  Service: ENT;  Laterality: Left;    MEDICATIONS: No current facility-administered medications for this  encounter.    carboxymethylcellul-glycerin (REFRESH OPTIVE) 0.5-0.9 % ophthalmic solution   losartan-hydrochlorothiazide (HYZAAR) 100-25 MG tablet   metFORMIN (GLUCOPHAGE-XR) 500 MG 24 hr tablet   metoprolol tartrate (LOPRESSOR) 50 MG tablet   pravastatin (PRAVACHOL) 40 MG tablet   FLUZONE HIGH-DOSE QUADRIVALENT 0.7 ML SUSY   glucose blood (ONETOUCH ULTRA) test strip   glucose blood test strip   ondansetron (ZOFRAN) 4 MG tablet   traMADol (ULTRAM) 50 MG tablet    Pt will need further evaluation by assigned anesthesiologist day of surgery.   Willeen Cass, PhD, FNP-BC [facility] Stay Surgical [facility]/Anesthesiology Phone: [phone] 02/13/2021 9:56 AM

## 2021-02-13 NOTE — Anesthesia Preprocedure Evaluation (Addendum)
Anesthesia Evaluation  Patient identified by MRN, date of birth, ID band Patient awake    Reviewed: Allergy & Precautions, NPO status , Patient's Chart, lab work & pertinent test results  Airway Mallampati: II  TM Distance: >3 FB Neck ROM: Full    Dental  (+) Teeth Intact, Dental Advisory Given   Pulmonary former smoker,    breath sounds clear to auscultation       Cardiovascular hypertension,  Rhythm:Regular Rate:Normal     Neuro/Psych    GI/Hepatic   Endo/Other  diabetes  Renal/GU      Musculoskeletal   Abdominal   Peds  Hematology   Anesthesia Other Findings   Reproductive/Obstetrics                            Anesthesia Physical Anesthesia Plan  ASA: 3  Anesthesia Plan: General   Post-op Pain Management:    Induction: Intravenous  PONV Risk Score and Plan: Ondansetron and Dexamethasone  Airway Management Planned: LMA  Additional Equipment:   Intra-op Plan:   Post-operative Plan: Extubation in OR  Informed Consent: I have reviewed the patients History and Physical, chart, labs and discussed the procedure including the risks, benefits and alternatives for the proposed anesthesia with the patient or authorized representative who has indicated his/her understanding and acceptance.     Dental advisory given  Plan Discussed with: CRNA and Anesthesiologist  Anesthesia Plan Comments: (See APP note by Durel Salts, FNP )      Anesthesia Quick Evaluation

## 2021-02-14 ENCOUNTER — Ambulatory Visit (HOSPITAL_COMMUNITY)
Admission: RE | Admit: 2021-02-14 | Discharge: 2021-02-14 | Disposition: A | Discharge status: Home / Self Care | Payer: PPO | Source: Ambulatory Visit | Attending: Plastic Surgery | Admitting: Plastic Surgery

## 2021-02-14 ENCOUNTER — Ambulatory Visit (HOSPITAL_COMMUNITY): Payer: PPO | Admitting: Emergency Medicine

## 2021-02-14 ENCOUNTER — Other Ambulatory Visit: Payer: Self-pay

## 2021-02-14 ENCOUNTER — Encounter (HOSPITAL_COMMUNITY): Payer: Self-pay | Admitting: Plastic Surgery

## 2021-02-14 ENCOUNTER — Encounter (HOSPITAL_COMMUNITY)
Admission: RE | Disposition: A | Discharge status: Home / Self Care | Payer: Self-pay | Source: Ambulatory Visit | Attending: Plastic Surgery

## 2021-02-14 DIAGNOSIS — E119 Type 2 diabetes mellitus without complications: Secondary | ICD-10-CM | POA: Diagnosis not present

## 2021-02-14 DIAGNOSIS — C9110 Chronic lymphocytic leukemia of B-cell type not having achieved remission: Secondary | ICD-10-CM | POA: Insufficient documentation

## 2021-02-14 DIAGNOSIS — Z888 Allergy status to other drugs, medicaments and biological substances status: Secondary | ICD-10-CM | POA: Insufficient documentation

## 2021-02-14 DIAGNOSIS — D696 Thrombocytopenia, unspecified: Secondary | ICD-10-CM | POA: Diagnosis not present

## 2021-02-14 DIAGNOSIS — C434 Malignant melanoma of scalp and neck: Secondary | ICD-10-CM | POA: Diagnosis not present

## 2021-02-14 DIAGNOSIS — Z87891 Personal history of nicotine dependence: Secondary | ICD-10-CM | POA: Diagnosis not present

## 2021-02-14 DIAGNOSIS — I10 Essential (primary) hypertension: Secondary | ICD-10-CM | POA: Diagnosis not present

## 2021-02-14 DIAGNOSIS — Z7984 Long term (current) use of oral hypoglycemic drugs: Secondary | ICD-10-CM | POA: Insufficient documentation

## 2021-02-14 DIAGNOSIS — E785 Hyperlipidemia, unspecified: Secondary | ICD-10-CM | POA: Diagnosis not present

## 2021-02-14 DIAGNOSIS — Z85820 Personal history of malignant melanoma of skin: Secondary | ICD-10-CM | POA: Insufficient documentation

## 2021-02-14 DIAGNOSIS — Z79899 Other long term (current) drug therapy: Secondary | ICD-10-CM | POA: Diagnosis not present

## 2021-02-14 HISTORY — DX: Cardiac murmur, unspecified: R01.1

## 2021-02-14 HISTORY — PX: MELANOMA EXCISION: SHX5266

## 2021-02-14 LAB — GLUCOSE, CAPILLARY
Glucose-Capillary: 144 mg/dL — ABNORMAL HIGH (ref 70–99)
Glucose-Capillary: 135 mg/dL — ABNORMAL HIGH (ref 70–99)
Glucose-Capillary: 155 mg/dL — ABNORMAL HIGH (ref 70–99)

## 2021-02-14 LAB — BASIC METABOLIC PANEL
Anion gap: 9 (ref 5–15)
BUN: 6 mg/dL — ABNORMAL LOW (ref 8–23)
CO2: 25 mmol/L (ref 22–32)
Calcium: 8.8 mg/dL — ABNORMAL LOW (ref 8.9–10.3)
Chloride: 88 mmol/L — ABNORMAL LOW (ref 98–111)
Creatinine, Ser: 1 mg/dL (ref 0.61–1.24)
GFR, Estimated: >60 mL/min (ref >60)
Glucose, Bld: 147 mg/dL — ABNORMAL HIGH (ref 70–99)
Potassium: 4.6 mmol/L (ref 3.5–5.1)
Sodium: 122 mmol/L — ABNORMAL LOW (ref 135–145)

## 2021-02-14 SURGERY — EXCISION, MELANOMA
Anesthesia: General | Site: Face

## 2021-02-14 MED ORDER — FENTANYL CITRATE (PF) 250 MCG/5ML IJ SOLN
INTRAMUSCULAR | Status: AC
  Filled 2021-02-14: qty 5

## 2021-02-14 MED ORDER — ORAL CARE MOUTH RINSE: 15.0000 mL | Freq: Once | OROMUCOSAL | Status: AC

## 2021-02-14 MED ORDER — BUPIVACAINE HCL (PF) 0.25 % IJ SOLN
INTRAMUSCULAR | Status: AC
  Filled 2021-02-14: qty 30

## 2021-02-14 MED ORDER — OXYCODONE HCL 5 MG/5ML PO SOLN: 5.0000 mg | Freq: Once | ORAL | Status: DC | PRN

## 2021-02-14 MED ORDER — DEXAMETHASONE SODIUM PHOSPHATE 10 MG/ML IJ SOLN
INTRAMUSCULAR | Status: DC | PRN
  Administered 2021-02-14: 5 mg via INTRAVENOUS

## 2021-02-14 MED ORDER — SODIUM CHLORIDE 0.9% FLUSH: 3.0000 mL | INTRAVENOUS | Status: DC | PRN

## 2021-02-14 MED ORDER — OXYCODONE HCL 5 MG PO TABS: 5.0000 mg | ORAL_TABLET | Freq: Once | ORAL | Status: DC | PRN

## 2021-02-14 MED ORDER — FENTANYL CITRATE (PF) 100 MCG/2ML IJ SOLN: 25.0000 ug | INTRAMUSCULAR | Status: DC | PRN

## 2021-02-14 MED ORDER — ONDANSETRON HCL 4 MG/2ML IJ SOLN
INTRAMUSCULAR | Status: DC | PRN
  Administered 2021-02-14: 4 mg via INTRAVENOUS

## 2021-02-14 MED ORDER — SODIUM CHLORIDE 0.9% FLUSH: 3.0000 mL | Freq: Two times a day (BID) | INTRAVENOUS | Status: DC

## 2021-02-14 MED ORDER — ACETAMINOPHEN 650 MG RE SUPP: 650.0000 mg | RECTAL | Status: DC | PRN

## 2021-02-14 MED ORDER — LIDOCAINE-EPINEPHRINE 1 %-1:100000 IJ SOLN
INTRAMUSCULAR | Status: AC
  Filled 2021-02-14: qty 1

## 2021-02-14 MED ORDER — 0.9 % SODIUM CHLORIDE (POUR BTL) OPTIME
TOPICAL | Status: DC | PRN
  Administered 2021-02-14: 1000 mL

## 2021-02-14 MED ORDER — ACETAMINOPHEN 325 MG PO TABS: 650.0000 mg | ORAL_TABLET | ORAL | Status: DC | PRN

## 2021-02-14 MED ORDER — LACTATED RINGERS IV SOLN: INTRAVENOUS | Status: DC

## 2021-02-14 MED ORDER — CEFAZOLIN SODIUM-DEXTROSE 2-4 GM/100ML-% IV SOLN
2.0000 g | INTRAVENOUS | Status: AC
  Administered 2021-02-14: 2 g via INTRAVENOUS

## 2021-02-14 MED ORDER — FENTANYL CITRATE (PF) 100 MCG/2ML IJ SOLN
25.0000 ug | INTRAMUSCULAR | Status: DC | PRN
Start: 2021-02-14 — End: 2021-02-15

## 2021-02-14 MED ORDER — SODIUM CHLORIDE 0.9 % IV SOLN: 250.0000 mL | INTRAVENOUS | Status: DC | PRN

## 2021-02-14 MED ORDER — EPINEPHRINE PF 1 MG/ML IJ SOLN
INTRAMUSCULAR | Status: AC
  Filled 2021-02-14: qty 1

## 2021-02-14 MED ORDER — CEFAZOLIN SODIUM-DEXTROSE 2-4 GM/100ML-% IV SOLN
INTRAVENOUS | Status: AC
  Filled 2021-02-14: qty 100

## 2021-02-14 MED ORDER — CHLORHEXIDINE GLUCONATE CLOTH 2 % EX PADS: 6.0000 | MEDICATED_PAD | Freq: Once | CUTANEOUS | Status: DC

## 2021-02-14 MED ORDER — EPHEDRINE SULFATE-NACL 50-0.9 MG/10ML-% IV SOSY
PREFILLED_SYRINGE | INTRAVENOUS | Status: DC | PRN
  Administered 2021-02-14: 5 mg via INTRAVENOUS

## 2021-02-14 MED ORDER — BUPIVACAINE-EPINEPHRINE 0.25% -1:200000 IJ SOLN
INTRAMUSCULAR | Status: DC | PRN
  Administered 2021-02-14: 6 mL

## 2021-02-14 MED ORDER — LIDOCAINE 2% (20 MG/ML) 5 ML SYRINGE
INTRAMUSCULAR | Status: DC | PRN
  Administered 2021-02-14: 60 mg via INTRAVENOUS

## 2021-02-14 MED ORDER — PROPOFOL 10 MG/ML IV BOLUS
INTRAVENOUS | Status: DC | PRN
  Administered 2021-02-14: 30 mg via INTRAVENOUS
  Administered 2021-02-14: 150 mg via INTRAVENOUS

## 2021-02-14 MED ORDER — ONDANSETRON HCL 4 MG/2ML IJ SOLN: 4.0000 mg | Freq: Once | INTRAMUSCULAR | Status: DC | PRN

## 2021-02-14 MED ORDER — CHLORHEXIDINE GLUCONATE 0.12 % MT SOLN: 15.0000 mL | Freq: Once | OROMUCOSAL | Status: AC

## 2021-02-14 MED ORDER — OXYCODONE HCL 5 MG PO TABS: 5.0000 mg | ORAL_TABLET | ORAL | Status: DC | PRN

## 2021-02-14 MED ORDER — CHLORHEXIDINE GLUCONATE 0.12 % MT SOLN
OROMUCOSAL | Status: AC
  Administered 2021-02-14: 15 mL via OROMUCOSAL
  Filled 2021-02-14: qty 15

## 2021-02-14 MED ORDER — FENTANYL CITRATE (PF) 250 MCG/5ML IJ SOLN
INTRAMUSCULAR | Status: DC | PRN
  Administered 2021-02-14 (×2): 25 ug via INTRAVENOUS

## 2021-02-14 SURGICAL SUPPLY — 24 items
BAG COUNTER SPONGE SURGICOUNT (BAG) ×4 IMPLANT
BAG SURGICOUNT SPONGE COUNTING (BAG) ×1
CNTNR URN SCR LID CUP LEK RST (MISCELLANEOUS) ×3 IMPLANT
CONT SPEC 4OZ STRL OR WHT (MISCELLANEOUS) ×2
COVER SURGICAL LIGHT HANDLE (MISCELLANEOUS) ×5 IMPLANT
DRAPE HALF SHEET 40X57 (DRAPES) ×5 IMPLANT
DRSG CUTIMED SORBACT 7X9 (GAUZE/BANDAGES/DRESSINGS) ×5 IMPLANT
ELECT REM PT RETURN 9FT ADLT (ELECTROSURGICAL) ×5
ELECTRODE REM PT RTRN 9FT ADLT (ELECTROSURGICAL) ×3 IMPLANT
GAUZE 4X4 16PLY ~~LOC~~+RFID DBL (SPONGE) ×5 IMPLANT
GLOVE SURG ENC MOIS LTX SZ6.5 (GLOVE) ×10 IMPLANT
GOWN STRL REUS W/ TWL LRG LVL3 (GOWN DISPOSABLE) ×6 IMPLANT
GOWN STRL REUS W/TWL LRG LVL3 (GOWN DISPOSABLE) ×4
KIT BASIN OR (CUSTOM PROCEDURE TRAY) ×5 IMPLANT
KIT TURNOVER KIT B (KITS) ×5 IMPLANT
NEEDLE HYPO 25GX1X1/2 BEV (NEEDLE) ×5 IMPLANT
NS IRRIG 1000ML POUR BTL (IV SOLUTION) ×5 IMPLANT
PACK GENERAL/GYN (CUSTOM PROCEDURE TRAY) ×5 IMPLANT
PACK SURGICAL SETUP 50X90 (CUSTOM PROCEDURE TRAY) ×5 IMPLANT
PAD ARMBOARD 7.5X6 YLW CONV (MISCELLANEOUS) ×10 IMPLANT
SYR CONTROL 10ML LL (SYRINGE) ×5 IMPLANT
TOWEL GREEN STERILE (TOWEL DISPOSABLE) ×5 IMPLANT
TOWEL GREEN STERILE FF (TOWEL DISPOSABLE) ×5 IMPLANT
UNDERPAD 30X36 HEAVY ABSORB (UNDERPADS AND DIAPERS) ×5 IMPLANT

## 2021-02-14 NOTE — Transfer of Care (Signed)
Immediate Anesthesia Transfer of Care Note  Patient: Thomas Zavala  Procedure(s) Performed: MELANOMA EXCISION of scalp  Patient Location: PACU  Anesthesia Type:General  Level of Consciousness: awake, alert  and oriented  Airway & Oxygen Therapy: Patient Spontanous Breathing  Post-op Assessment: Report given to RN and Post -op Vital signs reviewed and stable  Post vital signs: Reviewed and stable  Last Vitals:  Vitals Value Taken Time  BP 131/67 02/14/21 1813  Temp    Pulse 69 02/14/21 1814  Resp 12 02/14/21 1814  SpO2 95 % 02/14/21 1814  Vitals shown include unvalidated device data.  Last Pain:  Vitals:   02/14/21 1338  TempSrc: Oral         Complications: No notable events documented.

## 2021-02-14 NOTE — Discharge Instructions (Signed)
Keep head elevated as able. Ice to head today as tolerated.

## 2021-02-14 NOTE — Anesthesia Postprocedure Evaluation (Signed)
Anesthesia Post Note  Patient: Thomas Zavala  Procedure(s) Performed: MELANOMA EXCISION of scalp     Patient location during evaluation: PACU Anesthesia Type: General Level of consciousness: awake and alert Pain management: pain level controlled Vital Signs Assessment: post-procedure vital signs reviewed and stable Respiratory status: spontaneous breathing, nonlabored ventilation, respiratory function stable and patient connected to nasal cannula oxygen Cardiovascular status: blood pressure returned to baseline and stable Postop Assessment: no apparent nausea or vomiting Anesthetic complications: no   No notable events documented.  Last Vitals:  Vitals:   02/14/21 1828 02/14/21 1843  BP: (!) 151/71 (!) 150/70  Pulse: 61 65  Resp: 17 15  Temp:  (!) 36.2 C  SpO2: 95% 96%    Last Pain:  Vitals:   02/14/21 1828  TempSrc:   PainSc: 0-No pain                 Divine Imber COKER

## 2021-02-14 NOTE — Interval H&P Note (Signed)
History and Physical Interval Note:  02/14/2021 4:04 PM  Thomas Zavala  has presented today for surgery, with the diagnosis of Melanoma of skin.  The various methods of treatment have been discussed with the patient and family. After consideration of risks, benefits and other options for treatment, the patient has consented to  Procedure(s) with comments: Excision of left cheek changing lesion (Left) - 90 min Excision of forehead melanoma (N/A) Possible placement of ACell and Maitri Derm (N/A) Possible skin graft (N/A) as a surgical intervention.  The patient's history has been reviewed, patient examined, no change in status, stable for surgery.  I have reviewed the patient's chart and labs.  Questions were answered to the patient's satisfaction.     Loel Lofty Dhani Dannemiller

## 2021-02-14 NOTE — Op Note (Signed)
DATE OF OPERATION: 02/14/2021  LOCATION: [facility] Operating Room  PREOPERATIVE DIAGNOSIS: scalp melanoma  POSTOPERATIVE DIAGNOSIS: Same  PROCEDURE: Excision of scalp melanoma 2.5 cm  SURGEON: Xavyer Steenson Sanger Katherine Tout, DO  EBL: 1 cc  CONDITION: Stable  COMPLICATIONS: None  INDICATION: The patient, Thomas Zavala, is a 87 y.o. 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   PROCEDURE DETAILS:  The patient was seen prior to surgery and marked.  The IV antibiotics were given. The patient was taken to the operating room and given a general anesthetic. A standard time out was performed and all information was confirmed by those in the room. SCDs were placed.   The scalp was prepped and draped.  Local with epinephrine was injected for intraoperative hemostasis and postoperative pain control.  The bovie was used to excise the lesion with 1 cm margins.  Short stitch placed anterior and long stitch placed right side.  An additional deep margin was sent with stitch at anterior position.  Undermining was done for 5 mm to improve closure. Hemostasis achieved with electrocautery. The 3-0 and 4-0 Monocryl was used to close the incision with vertical mattress sutures.  The patient was allowed to wake up and taken to recovery room in stable condition at the end of the case. The family was notified at the end of the case.   The advanced practice practitioner (APP) assisted throughout the case.  The APP was essential in retraction and counter traction when needed to make the case progress smoothly.  This retraction and assistance made it possible to see the tissue plans for the procedure.  The assistance was needed for blood control, tissue re-approximation and assisted with closure of the incision site.

## 2021-02-14 NOTE — Anesthesia Procedure Notes (Signed)
Procedure Name: LMA Insertion Date/Time: 02/14/2021 5:39 PM Performed by: Dorthea Cove, CRNA Pre-anesthesia Checklist: Patient identified, Emergency Drugs available, Suction available and Patient being monitored Patient Re-evaluated:Patient Re-evaluated prior to induction Oxygen Delivery Method: Circle System Utilized Preoxygenation: Pre-oxygenation with 100% oxygen Induction Type: IV induction Ventilation: Mask ventilation without difficulty LMA: LMA inserted LMA Size: 4.0 Number of attempts: 1 Airway Equipment and Method: Bite block Placement Confirmation: positive ETCO2 Tube secured with: Tape Dental Injury: Teeth and Oropharynx as per pre-operative assessment

## 2021-02-15 ENCOUNTER — Encounter (HOSPITAL_COMMUNITY): Payer: Self-pay | Admitting: Plastic Surgery

## 2021-02-20 ENCOUNTER — Other Ambulatory Visit: Payer: Self-pay

## 2021-02-20 ENCOUNTER — Encounter: Payer: Self-pay | Admitting: Podiatry

## 2021-02-20 ENCOUNTER — Ambulatory Visit: Payer: PPO | Admitting: Podiatry

## 2021-02-20 DIAGNOSIS — B351 Tinea unguium: Secondary | ICD-10-CM

## 2021-02-20 DIAGNOSIS — M79674 Pain in right toe(s): Secondary | ICD-10-CM | POA: Diagnosis not present

## 2021-02-20 DIAGNOSIS — E119 Type 2 diabetes mellitus without complications: Secondary | ICD-10-CM

## 2021-02-20 DIAGNOSIS — M79675 Pain in left toe(s): Secondary | ICD-10-CM | POA: Diagnosis not present

## 2021-02-20 DIAGNOSIS — S91209A Unspecified open wound of unspecified toe(s) with damage to nail, initial encounter: Secondary | ICD-10-CM

## 2021-02-20 NOTE — Progress Notes (Signed)
Subjective: Thomas Zavala is a pleasant 87 y.o. male patient seen today painful thick toenails that are difficult to trim. Pain interferes with ambulation. Aggravating factors include wearing enclosed shoe gear. Pain is relieved with periodic professional debridement.  He is seen for preventative diabetic foot care today. Patient states he noticed blood on his right great toe on yesterday. He cannot recall any episodes of trauma. He states he cleaned the toe and applied a band-aid. He relates digit is not painful.  He did not check his blood glucose on today.  PCP is Corrington, Kip A, MD. Last visit was: 12/21/2020.  Allergies  Allergen Reactions   Fluorouracil Other (See Comments)    Burns over affected area (SJS) Other reaction(s): Facial Swelling    Objective: Physical Exam  General: Thomas Zavala is a pleasant 87 y.o. Caucasian male, WD, WN in NAD. AAO x 3.   Vascular:  Capillary refill time to digits immediate b/l. Palpable DP pulse(s) b/l lower extremities Palpable PT pulse(s) b/l lower extremities Pedal hair sparse. Lower extremity skin temperature gradient within normal limits. Trace edema noted b/l lower extremities.  Dermatological:  Pedal skin is thin shiny, atrophic b/l lower extremities. No interdigital macerations b/l lower extremities Toenails 2-5 bilaterally and L hallux elongated, discolored, dystrophic, thickened, and crumbly with subungual debris and tenderness to dorsal palpation.   Right hallux: There is noted onchyolysis of entire nailplate of R hallux Lateral 50% is vertical and medial 50% is loose.  The nailbed does show evidence of superficial laceration with dried heme. There is no erythema, no edema, no drainage, no underlying fluctuance.  Musculoskeletal:  Normal muscle strength 5/5 to all lower extremity muscle groups bilaterally. No pain crepitus or joint limitation noted with ROM b/l. No gross bony deformities bilaterally.  Neurological:  Protective  sensation intact 5/5 intact bilaterally with 10g monofilament b/l. Vibratory sensation intact b/l.  Assessment and Plan:  1. Pain due to onychomycosis of toenails of both feet   2. Nail avulsion of toe, initial encounter   3. Type 2 diabetes mellitus without complication, without long-term current use of insulin (HCC)     -Examined patient. -Loose nailplate detached from its remaining attachment to the proximal nailfold. Nailbed cleansed with hydrogen peroxide followed by isopropyl alcohol. Triple antibiotic ointment and fabric band-aid applied. He is to apply antibiotic ointment to nailbed once daily until healed. Call office if he experiences any problems. -Patient to continue soft, supportive shoe gear daily. -Toenails 2-5 bilaterally and L hallux debrided in length and girth without iatrogenic bleeding with sterile nail nipper and dremel.  -Patient to report any pedal injuries to medical professional immediately. -Patient/POA to call should there be question/concern in the interim.  Return in about 3 months (around 05/23/2021).  Marzetta Board, DPM

## 2021-02-21 LAB — SURGICAL PATHOLOGY

## 2021-02-23 ENCOUNTER — Other Ambulatory Visit: Payer: Self-pay

## 2021-02-23 ENCOUNTER — Ambulatory Visit (INDEPENDENT_AMBULATORY_CARE_PROVIDER_SITE_OTHER): Payer: PPO | Admitting: Plastic Surgery

## 2021-02-23 DIAGNOSIS — C434 Malignant melanoma of scalp and neck: Secondary | ICD-10-CM

## 2021-02-23 NOTE — Progress Notes (Signed)
The patient is a 87 year old gentleman here on his own.  He is here for follow-up after undergoing excision of the melanoma.  The path is negative but it will be presented at the [place].  I let the patient know about this.  The area appears to be healing well.  There is a lot of scab that I tried to soften up and cleared up a little bit.  I did want to pull too hard and open the incision.  He is fine to go ahead and continue showering.  I would like to see him back in 2 weeks.  He is in agreement.  I also let him know that he is welcome to have his daughter call if she has any questions.

## 2021-02-26 DIAGNOSIS — X32XXXD Exposure to sunlight, subsequent encounter: Secondary | ICD-10-CM | POA: Diagnosis not present

## 2021-02-26 DIAGNOSIS — L570 Actinic keratosis: Secondary | ICD-10-CM | POA: Diagnosis not present

## 2021-02-28 ENCOUNTER — Other Ambulatory Visit: Payer: Self-pay

## 2021-02-28 ENCOUNTER — Encounter (HOSPITAL_COMMUNITY)
Admission: RE | Admit: 2021-02-28 | Discharge: 2021-02-28 | Disposition: A | Discharge status: Home / Self Care | Payer: PPO | Source: Ambulatory Visit | Attending: Hematology | Admitting: Hematology

## 2021-02-28 DIAGNOSIS — C439 Malignant melanoma of skin, unspecified: Secondary | ICD-10-CM

## 2021-02-28 DIAGNOSIS — R590 Localized enlarged lymph nodes: Secondary | ICD-10-CM | POA: Diagnosis not present

## 2021-02-28 DIAGNOSIS — C9110 Chronic lymphocytic leukemia of B-cell type not having achieved remission: Secondary | ICD-10-CM | POA: Diagnosis not present

## 2021-02-28 LAB — GLUCOSE, CAPILLARY: Glucose-Capillary: 137 mg/dL — ABNORMAL HIGH (ref 70–99)

## 2021-02-28 IMAGING — PT NM PET IMAGE RESTAGE (PS) WHOLE BODY
8 series · 25 of 25 positions shown · non-contrast
Comparison: PET-CT scan [DATE])

CLINICAL DATA: Subsequent treatment strategy for malignant
melanoma.

EXAM:
NUCLEAR MEDICINE PET WHOLE BODY
TECHNIQUE: 9.1 mCi F-18 FDG was injected intravenously. Full-ring PET imaging
was performed from the head to foot after the radiotracer. CT data
was obtained and used for attenuation correction and anatomic
localization.
Fasting blood glucose: 136 mg/dl

[Series 3: pet wb ac · axial · 5.0mm · 4.07mm/px · z∈[-193,+1691]mm · 5 of 472 slices shown]
[im 1/472]
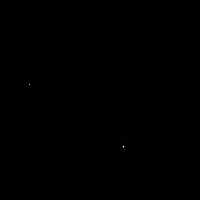
[im 118/472]
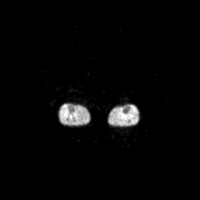
[im 236/472]
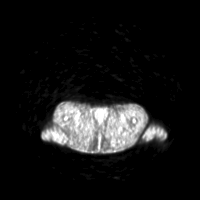
[im 354/472]
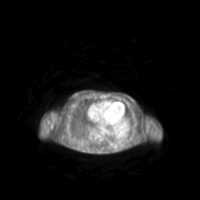
[im 472/472]
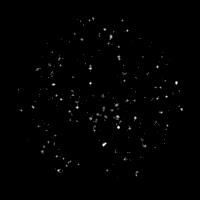

[Series 4: ct wb 5.0 hd_fov · axial · 5.0mm · 1.32mm/px · z∈[-201,+1691]mm · 5 of 474 slices shown]
[im 1/474  full-range]
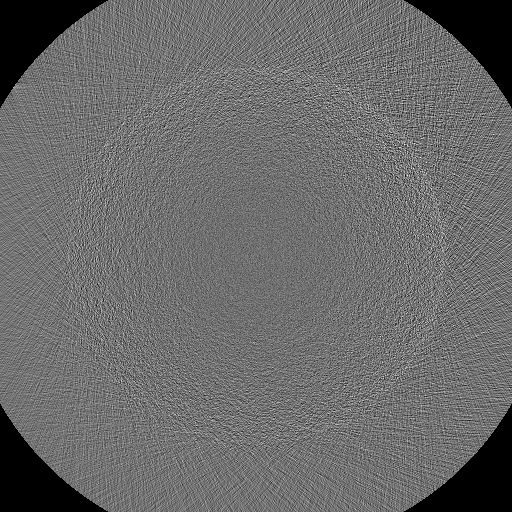
[im 119/474  soft-tissue]
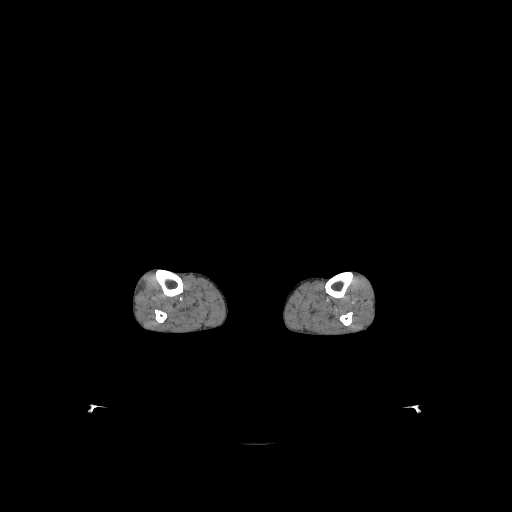
[im 237/474  soft-tissue]
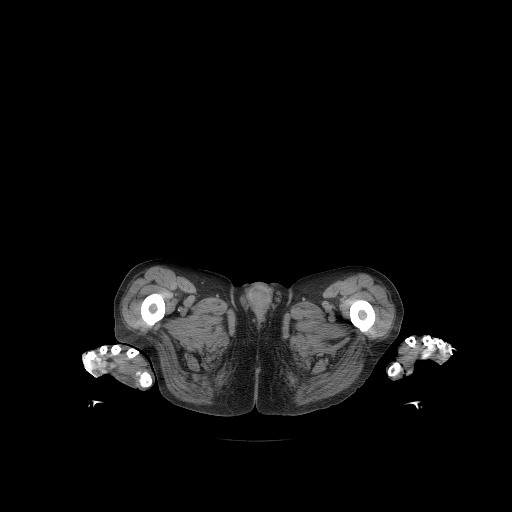
[im 355/474  soft-tissue]
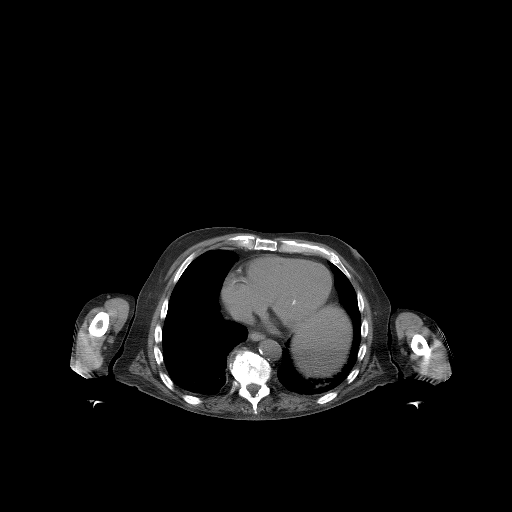
[im 474/474  soft-tissue]
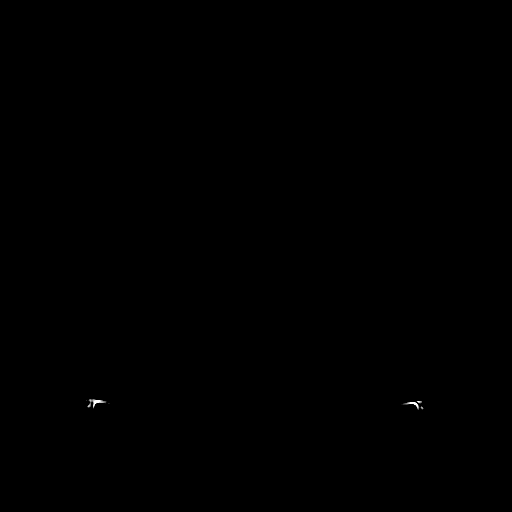

[Series 5: pet wb nac · axial · 5.0mm · 4.07mm/px · z∈[-193,+1691]mm · 5 of 472 slices shown]
[im 1/472  full-range]
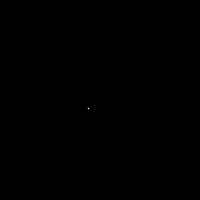
[im 118/472]
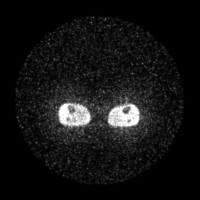
[im 236/472]
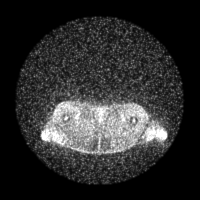
[im 354/472]
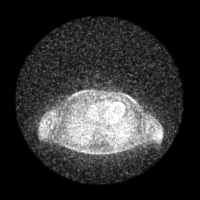
[im 472/472]
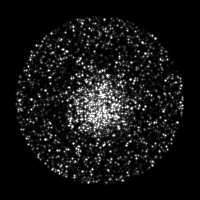

[Series 8: ct wb 5.0 br59 lung_bone · axial · 5.0mm · 0.68mm/px · 1 of 60 slices shown]
[im 1/60]
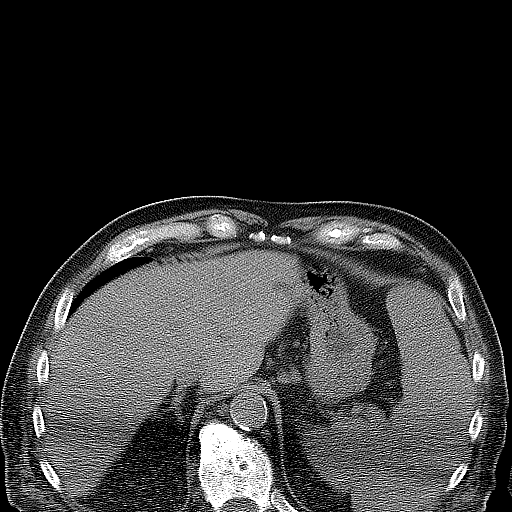

[Series 603: fused cor · 1 of 38 slices shown]
[im 1/38]
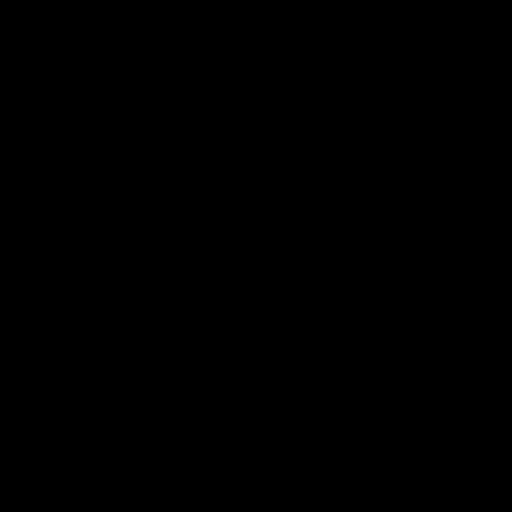

[Series 604: <mip collection> · coronal · 3.92mm/px · 1 of 32 slices shown]
[im 1/32]
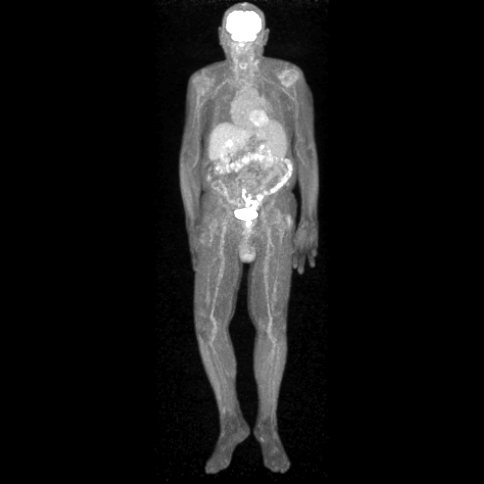

[Series 605: range-ct wb 5.0 hd_fov-tra-<alpha range> · 6 of 455 slices shown]
[im 1/455]
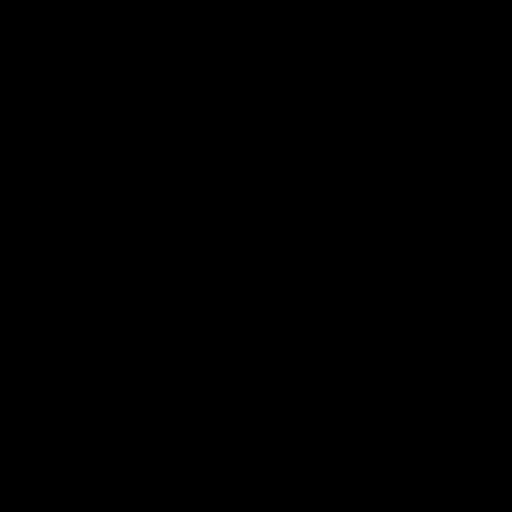
[im 91/455]
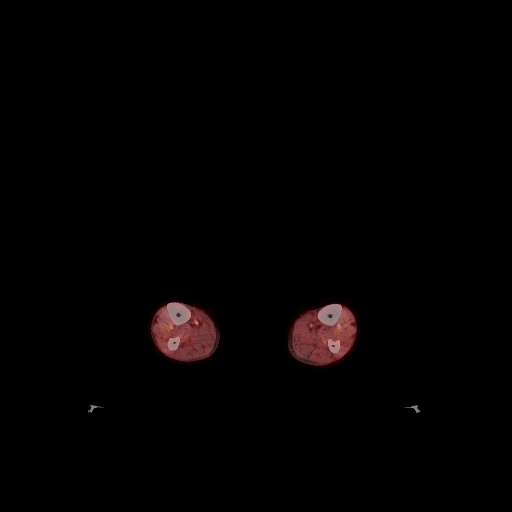
[im 182/455]
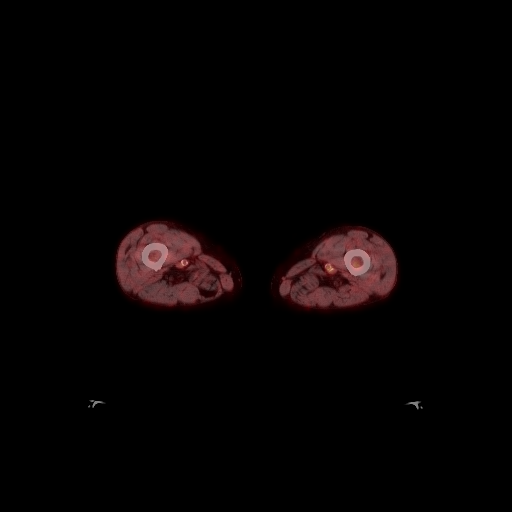
[im 273/455]
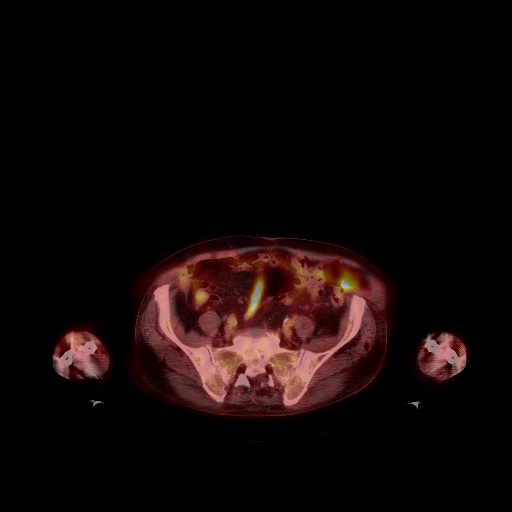
[im 364/455]
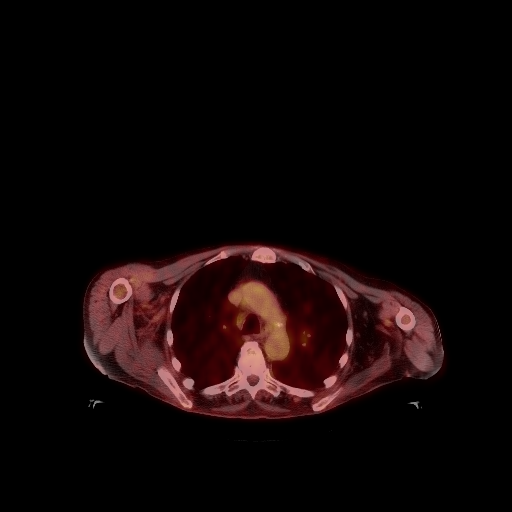
[im 455/455]
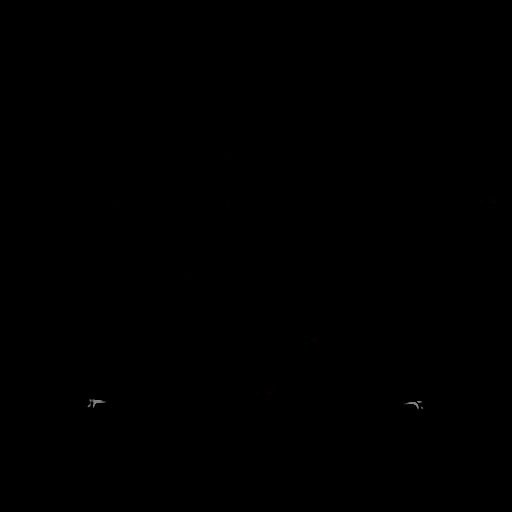

[Series 8320: results mm oncology reading · 1.3mm · 1.35mm/px · 1 of 5 slices shown]
[im 1/5]
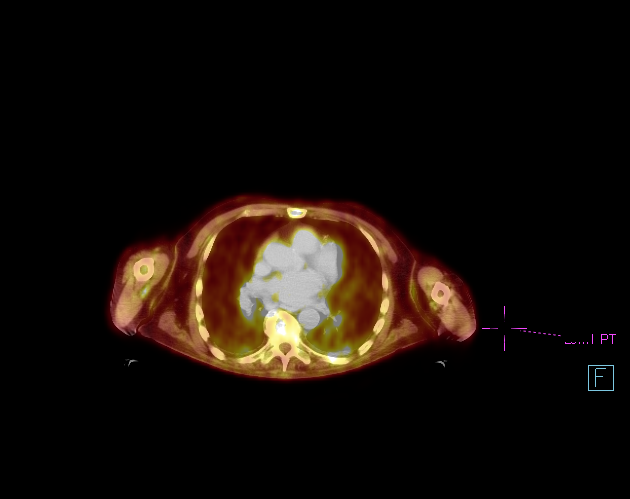

[25 of 25 positions shown; findings below may reference images not displayed]

FINDINGS: Mediastinal blood pool activity: SUV max

HEAD/NECK: mildly hypermetabolic LEFT level 2 lymph node beneath the
sternocleidomastoid muscle measures 11 mm with SUV max equal 3.7.
Activity and size mildly increased from comparison exam (SUV max
equal 2.1). A similar node on the RIGHT with SUV max equal
(image 59

Incidental CT findings: none

CHEST: RIGHT lower paratracheal node measures 13 mm compared to 16
mm with SUV max equal 2.5 compared SUV max equal 3.1.

No hypermetabolic lesions of thorax suggest melanoma

Incidental CT findings: No suspicious pulmonary nodules.

ABDOMEN/PELVIS: No abnormal hypermetabolic activity within the
liver, pancreas, adrenal glands, or spleen. No hypermetabolic lymph
nodes in the abdomen or pelvis.

Incidental CT findings: none

SKELETON: No focal hypermetabolic activity to suggest skeletal
metastasis.

Incidental CT findings: none

EXTREMITIES: No abnormal hypermetabolic activity in the lower
extremities.

Incidental CT findings: Severe osteoarthritis of the thoracolumbar
spine
IMPRESSION: 1. No evidence of malignant melanoma on whole-body FDG PET scan.
2. Mildly metabolic cervical lymph nodes and mediastinal lymph nodes
are favored related to patient's reported chronic lymphocytic
leukemia. The cervical lymph nodes are slightly larger.

## 2021-02-28 MED ORDER — FLUDEOXYGLUCOSE F - 18 (FDG) INJECTION
9.1000 | Freq: Once | INTRAVENOUS | Status: AC | PRN
  Administered 2021-02-28: 9.1 via INTRAVENOUS

## 2021-03-07 ENCOUNTER — Ambulatory Visit (INDEPENDENT_AMBULATORY_CARE_PROVIDER_SITE_OTHER): Payer: PPO

## 2021-03-07 ENCOUNTER — Other Ambulatory Visit: Payer: Self-pay

## 2021-03-07 VITALS — BP 160/77 | HR 60 | Ht 71.0 in | Wt 180.6 lb

## 2021-03-07 DIAGNOSIS — C434 Malignant melanoma of scalp and neck: Secondary | ICD-10-CM

## 2021-03-13 ENCOUNTER — Encounter: Payer: PPO | Admitting: Plastic Surgery

## 2021-03-18 NOTE — Progress Notes (Signed)
03/07/21 Thomas Zavala in office today for post-op f/u. Thomas Zavala had an excision of melanoma of the scalp by Dr, Marla Roe on 02/14/21. Thomas Zavala denies the following: chills/fever/drainage/redness or other complications.  Pain = only tender to touch. On exam the incision is intact & there is no marked swelling/bruising.  There is a layer of "scab" covering most of the incision & I did attempt to remove some of this, but unable to remove it all.  I instructed pt to allow warm water/soap to run over the site while showering- without direct/forceful water spray.  He understands that this may assist in the scab sloughing off. He will call for any concerns, otherwise f/u with Dr. Marla Roe on 04/17/21

## 2021-03-18 NOTE — Patient Instructions (Signed)
Patient will call for any concerns

## 2021-03-28 ENCOUNTER — Telehealth: Payer: Self-pay | Admitting: Hematology

## 2021-03-28 NOTE — Telephone Encounter (Signed)
Left message with rescheduled upcoming appointment due to provider's emergency. 

## 2021-03-29 ENCOUNTER — Other Ambulatory Visit: Payer: PPO

## 2021-03-29 ENCOUNTER — Ambulatory Visit: Payer: PPO | Admitting: Hematology

## 2021-04-12 ENCOUNTER — Inpatient Hospital Stay: Payer: PPO | Attending: Family Medicine

## 2021-04-12 ENCOUNTER — Other Ambulatory Visit: Payer: Self-pay

## 2021-04-12 ENCOUNTER — Inpatient Hospital Stay: Payer: PPO | Admitting: Hematology

## 2021-04-12 VITALS — BP 162/65 | HR 63 | Temp 97.9°F | Resp 18 | Wt 177.1 lb

## 2021-04-12 DIAGNOSIS — C439 Malignant melanoma of skin, unspecified: Secondary | ICD-10-CM | POA: Diagnosis not present

## 2021-04-12 DIAGNOSIS — R319 Hematuria, unspecified: Secondary | ICD-10-CM | POA: Diagnosis not present

## 2021-04-12 DIAGNOSIS — Z87891 Personal history of nicotine dependence: Secondary | ICD-10-CM | POA: Diagnosis not present

## 2021-04-12 DIAGNOSIS — M5442 Lumbago with sciatica, left side: Secondary | ICD-10-CM | POA: Diagnosis not present

## 2021-04-12 DIAGNOSIS — C4339 Malignant melanoma of other parts of face: Secondary | ICD-10-CM | POA: Insufficient documentation

## 2021-04-12 DIAGNOSIS — C9110 Chronic lymphocytic leukemia of B-cell type not having achieved remission: Secondary | ICD-10-CM | POA: Insufficient documentation

## 2021-04-12 LAB — CBC WITH DIFFERENTIAL/PLATELET
Abs Immature Granulocytes: 0.07 10*3/uL (ref 0.00–0.07)
Basophils Absolute: 0.1 10*3/uL (ref 0.0–0.1)
Basophils Relative: 0 %
Eosinophils Absolute: 0.1 10*3/uL (ref 0.0–0.5)
Eosinophils Relative: 0 %
HCT: 39.6 % (ref 39.0–52.0)
Hemoglobin: 13.5 g/dL (ref 13.0–17.0)
Immature Granulocytes: 0 %
Lymphocytes Relative: 85 %
Lymphs Abs: 45.7 10*3/uL — ABNORMAL HIGH (ref 0.7–4.0)
MCH: 34.7 pg — ABNORMAL HIGH (ref 26.0–34.0)
MCHC: 34.1 g/dL (ref 30.0–36.0)
MCV: 101.8 fL — ABNORMAL HIGH (ref 80.0–100.0)
Monocytes Absolute: 5 10*3/uL — ABNORMAL HIGH (ref 0.1–1.0)
Monocytes Relative: 9 %
Neutro Abs: 3.3 10*3/uL (ref 1.7–7.7)
Neutrophils Relative %: 6 %
Platelets: 68 10*3/uL — ABNORMAL LOW (ref 150–400)
RBC: 3.89 MIL/uL — ABNORMAL LOW (ref 4.22–5.81)
RDW: 14.4 % (ref 11.5–15.5)
WBC: 54.2 10*3/uL (ref 4.0–10.5)
nRBC: 0 % (ref 0.0–0.2)

## 2021-04-12 LAB — CMP (CANCER CENTER ONLY)
ALT: 10 U/L (ref 0–44)
AST: 17 U/L (ref 15–41)
Albumin: 3.7 g/dL (ref 3.5–5.0)
Alkaline Phosphatase: 98 U/L (ref 38–126)
Anion gap: 8 (ref 5–15)
BUN: 17 mg/dL (ref 8–23)
CO2: 29 mmol/L (ref 22–32)
Calcium: 9 mg/dL (ref 8.9–10.3)
Chloride: 95 mmol/L — ABNORMAL LOW (ref 98–111)
Creatinine: 1.15 mg/dL (ref 0.61–1.24)
GFR, Estimated: >60 mL/min (ref >60)
Glucose, Bld: 144 mg/dL — ABNORMAL HIGH (ref 70–99)
Potassium: 3.3 mmol/L — ABNORMAL LOW (ref 3.5–5.1)
Sodium: 132 mmol/L — ABNORMAL LOW (ref 135–145)
Total Bilirubin: 0.9 mg/dL (ref 0.3–1.2)
Total Protein: 6.1 g/dL — ABNORMAL LOW (ref 6.5–8.1)

## 2021-04-12 LAB — SAMPLE TO BLOOD BANK

## 2021-04-12 LAB — LACTATE DEHYDROGENASE: LDH: 159 U/L (ref 98–192)

## 2021-04-12 MED ORDER — TRAMADOL HCL 50 MG PO TABS: 50.0000 mg | ORAL_TABLET | Freq: Four times a day (QID) | ORAL | 0 refills | Status: DC | PRN

## 2021-04-12 MED ORDER — PREDNISONE 20 MG PO TABS: 20.0000 mg | ORAL_TABLET | Freq: Every day | ORAL | 0 refills | Status: DC

## 2021-04-13 ENCOUNTER — Telehealth: Payer: Self-pay | Admitting: Hematology

## 2021-04-13 NOTE — Telephone Encounter (Signed)
Scheduled follow-up appointment per 8/25 los. Patient is aware. 

## 2021-04-16 ENCOUNTER — Other Ambulatory Visit: Payer: Self-pay

## 2021-04-16 ENCOUNTER — Emergency Department (HOSPITAL_COMMUNITY)
Admission: EM | Admit: 2021-04-16 | Discharge: 2021-04-16 | Disposition: A | Discharge status: Home / Self Care | Payer: PPO | Attending: Emergency Medicine | Admitting: Emergency Medicine

## 2021-04-16 ENCOUNTER — Emergency Department (HOSPITAL_COMMUNITY): Payer: PPO

## 2021-04-16 ENCOUNTER — Encounter (HOSPITAL_COMMUNITY): Payer: Self-pay | Admitting: *Deleted

## 2021-04-16 DIAGNOSIS — Z87891 Personal history of nicotine dependence: Secondary | ICD-10-CM | POA: Insufficient documentation

## 2021-04-16 DIAGNOSIS — Z8673 Personal history of transient ischemic attack (TIA), and cerebral infarction without residual deficits: Secondary | ICD-10-CM | POA: Diagnosis not present

## 2021-04-16 DIAGNOSIS — I10 Essential (primary) hypertension: Secondary | ICD-10-CM | POA: Diagnosis not present

## 2021-04-16 DIAGNOSIS — Z7984 Long term (current) use of oral hypoglycemic drugs: Secondary | ICD-10-CM | POA: Insufficient documentation

## 2021-04-16 DIAGNOSIS — E119 Type 2 diabetes mellitus without complications: Secondary | ICD-10-CM | POA: Diagnosis not present

## 2021-04-16 DIAGNOSIS — Z79899 Other long term (current) drug therapy: Secondary | ICD-10-CM | POA: Insufficient documentation

## 2021-04-16 DIAGNOSIS — D329 Benign neoplasm of meninges, unspecified: Secondary | ICD-10-CM | POA: Insufficient documentation

## 2021-04-16 DIAGNOSIS — Z85828 Personal history of other malignant neoplasm of skin: Secondary | ICD-10-CM | POA: Insufficient documentation

## 2021-04-16 DIAGNOSIS — R531 Weakness: Secondary | ICD-10-CM | POA: Diagnosis not present

## 2021-04-16 LAB — BASIC METABOLIC PANEL
Anion gap: 10 (ref 5–15)
BUN: 19 mg/dL (ref 8–23)
CO2: 28 mmol/L (ref 22–32)
Calcium: 9.2 mg/dL (ref 8.9–10.3)
Chloride: 90 mmol/L — ABNORMAL LOW (ref 98–111)
Creatinine, Ser: 1.08 mg/dL (ref 0.61–1.24)
GFR, Estimated: >60 mL/min (ref >60)
Glucose, Bld: 241 mg/dL — ABNORMAL HIGH (ref 70–99)
Potassium: 5.4 mmol/L — ABNORMAL HIGH (ref 3.5–5.1)
Sodium: 128 mmol/L — ABNORMAL LOW (ref 135–145)

## 2021-04-16 LAB — CBC WITH DIFFERENTIAL/PLATELET
Abs Immature Granulocytes: 0 10*3/uL (ref 0.00–0.07)
Basophils Absolute: 0 10*3/uL (ref 0.0–0.1)
Basophils Relative: 0 %
Eosinophils Absolute: 0 10*3/uL (ref 0.0–0.5)
Eosinophils Relative: 0 %
HCT: 43.5 % (ref 39.0–52.0)
Hemoglobin: 14.2 g/dL (ref 13.0–17.0)
Lymphocytes Relative: 82 %
Lymphs Abs: 66.6 10*3/uL — ABNORMAL HIGH (ref 0.7–4.0)
MCH: 33.3 pg (ref 26.0–34.0)
MCHC: 32.6 g/dL (ref 30.0–36.0)
MCV: 101.9 fL — ABNORMAL HIGH (ref 80.0–100.0)
Monocytes Absolute: 1.6 10*3/uL — ABNORMAL HIGH (ref 0.1–1.0)
Monocytes Relative: 2 %
Neutro Abs: 13 10*3/uL — ABNORMAL HIGH (ref 1.7–7.7)
Neutrophils Relative %: 16 %
Platelets: 122 10*3/uL — ABNORMAL LOW (ref 150–400)
RBC: 4.27 MIL/uL (ref 4.22–5.81)
RDW: 13.7 % (ref 11.5–15.5)
WBC: 81.2 10*3/uL (ref 4.0–10.5)
nRBC: 0 % (ref 0.0–0.2)
nRBC: 0 /100 WBC

## 2021-04-16 LAB — URIC ACID: Uric Acid, Serum: 4.6 mg/dL (ref 3.7–8.6)

## 2021-04-16 LAB — TROPONIN I (HIGH SENSITIVITY): Troponin I (High Sensitivity): 10 ng/L (ref <18)

## 2021-04-16 LAB — LACTATE DEHYDROGENASE: LDH: 165 U/L (ref 98–192)

## 2021-04-16 IMAGING — CT CT HEAD W/O CM
4 series · 17 of 47 positions shown, 19 images · non-contrast
Comparison: Brain MRI dated [DATE].

CLINICAL DATA: Transient ischemic attack.  Weakness.

EXAM:
CT HEAD WITHOUT CONTRAST
TECHNIQUE: Contiguous axial images were obtained from the base of the skull
through the vertex without intravenous contrast.

[Series 3: head (person_name) · axial · 0.44mm/px · z∈[+1318,+1448]mm · 7 of 36 slices shown, 9 images]
[im 5/36  brain]
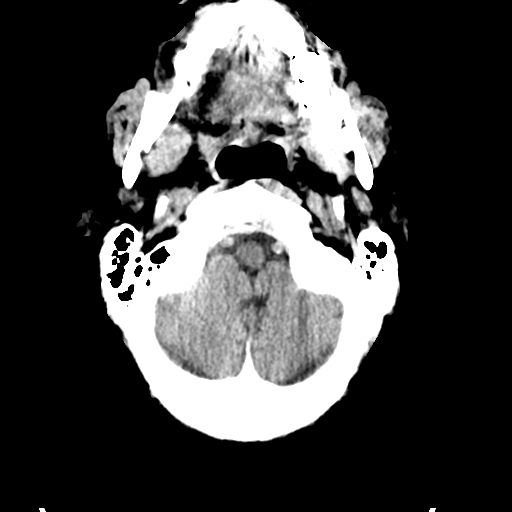
[im 5/36  bone]
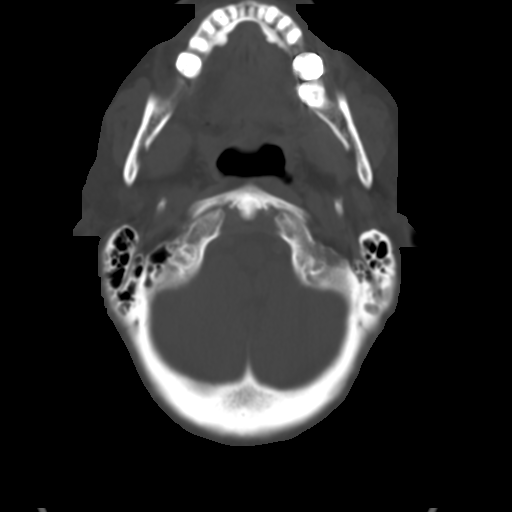
[im 9/36  brain]
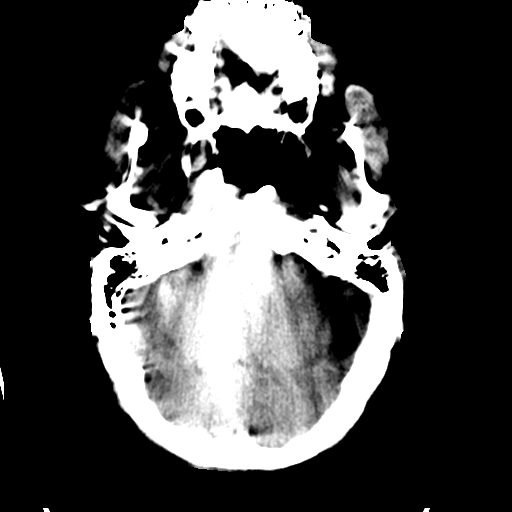
[im 14/36  brain]
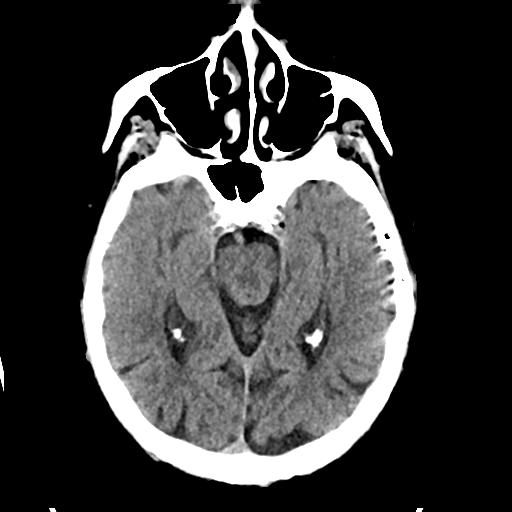
[im 18/36  brain]
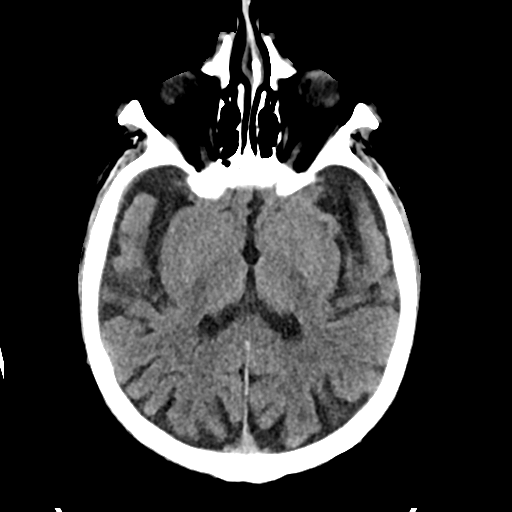
[im 22/36  brain]
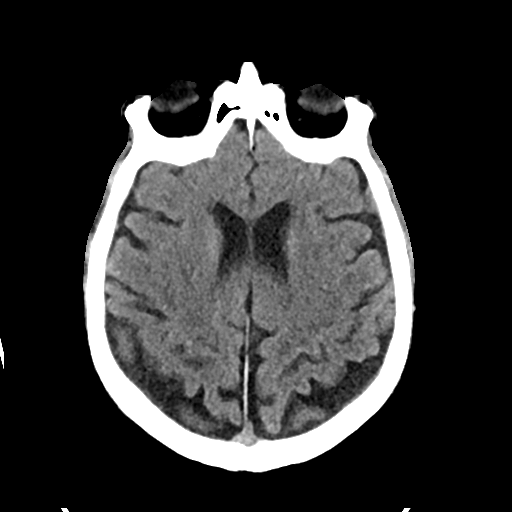
[im 22/36  bone]
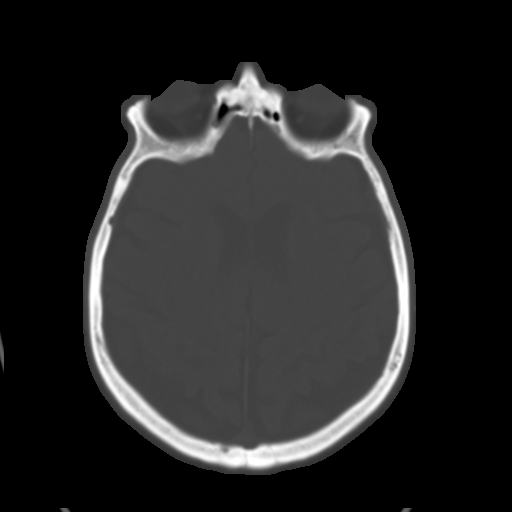
[im 27/36  brain]
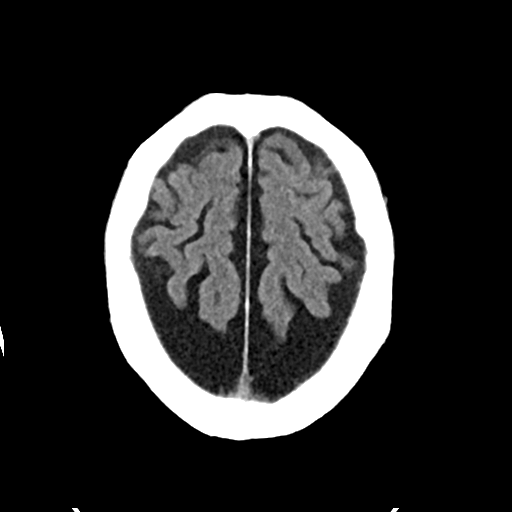
[im 31/36  brain]
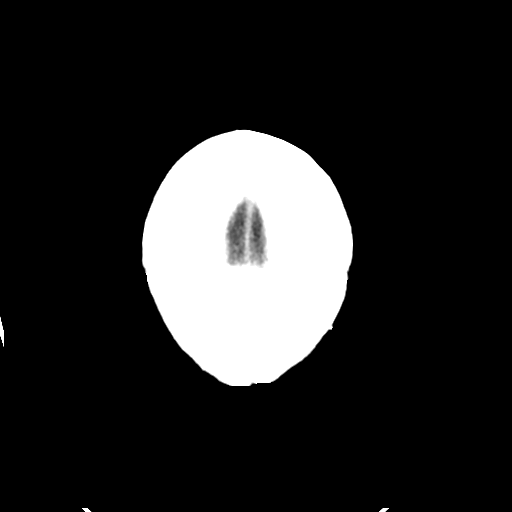

[Series 4: head bone (person_name) · axial · 0.44mm/px · z∈[+1314,+1376]mm · 4 of 88 slices shown]
[im 9/88  bone]
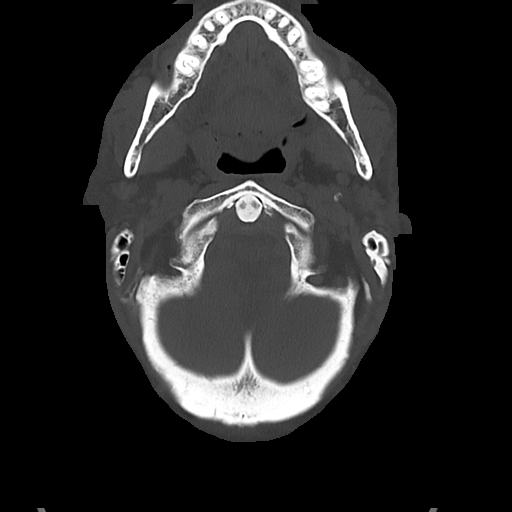
[im 18/88  bone]
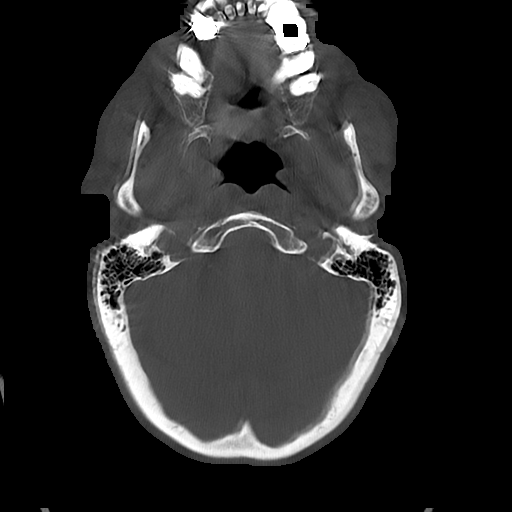
[im 27/88  bone]
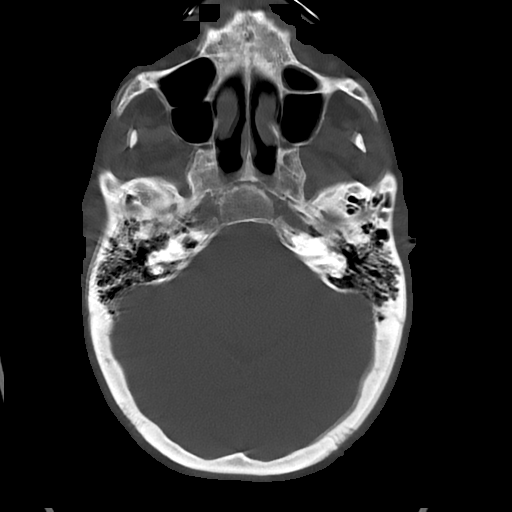
[im 40/88  bone]
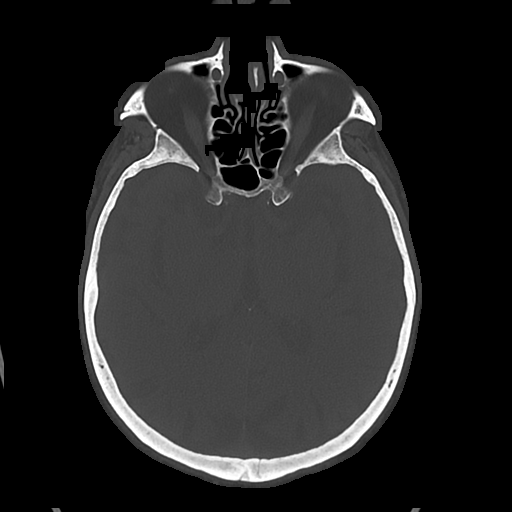

[Series 5: cor soft (person_name) · coronal · 0.37mm/px · 3 of 74 slices shown]
[im 28/74  brain]
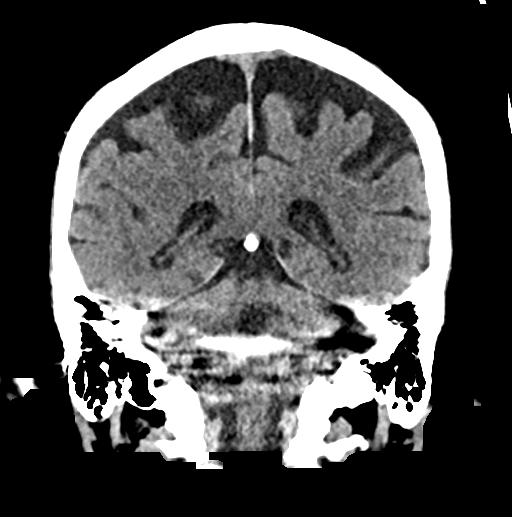
[im 34/74  brain]
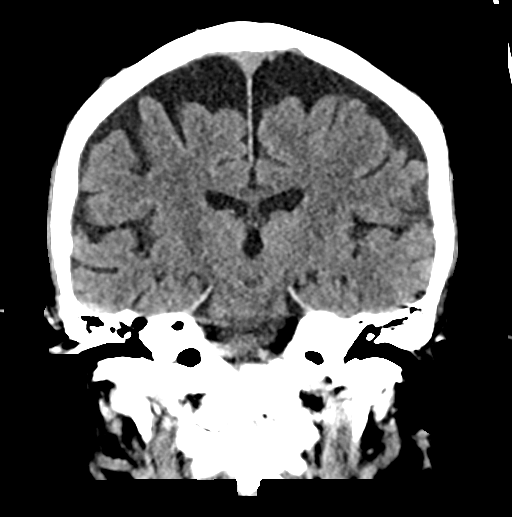
[im 40/74  brain]
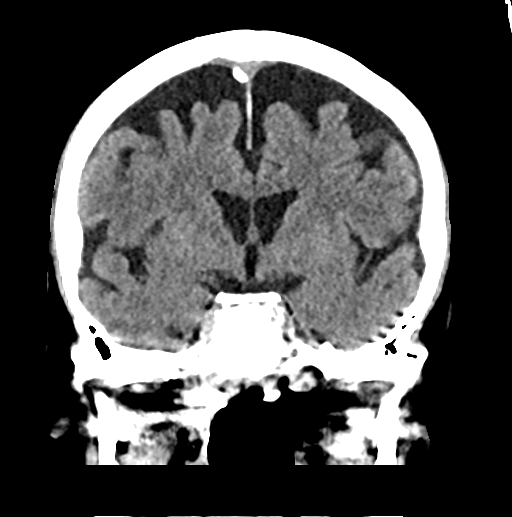

[Series 6: sag soft (person_name) · sagittal · 0.36mm/px · 3 of 62 slices shown]
[im 21/62  brain]
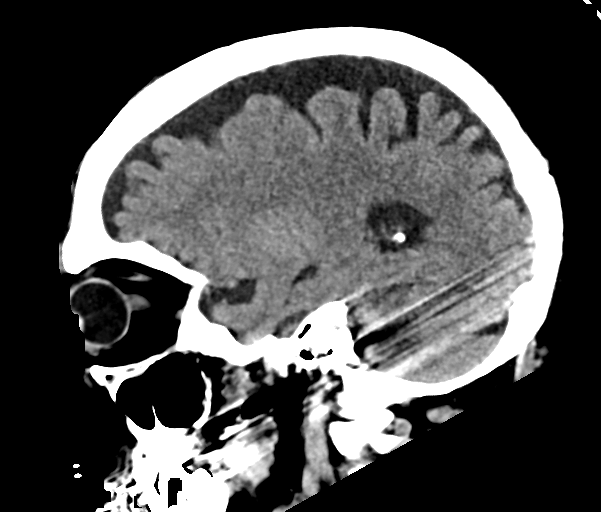
[im 31/62  brain]
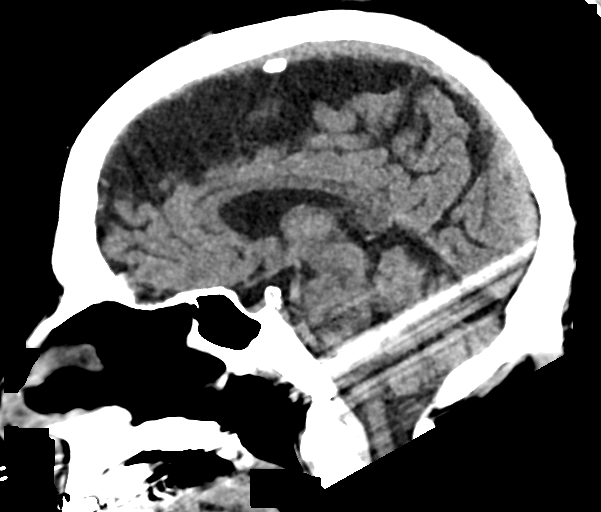
[im 41/62  brain]
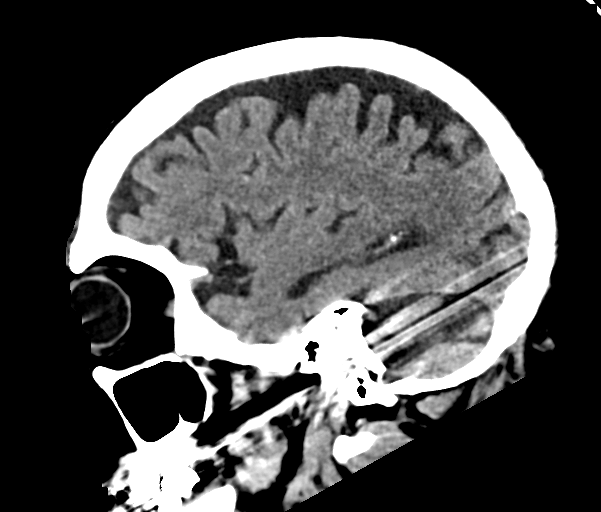

[17 of 47 positions shown; findings below may reference images not displayed]

FINDINGS: Evaluation of this exam is limited due to motion artifact.

Brain: Moderate age-related atrophy and chronic microvascular
ischemic changes. There is no acute intracranial hemorrhage. No mass
effect or midline shift. No extra-axial fluid collection. There is a
1.2 x 0.6 cm dural-based lesion along the left frontal lobe ([DATE])
compatible with a meningioma.

Vascular: No hyperdense vessel or unexpected calcification.

Skull: Normal. Negative for fracture or focal lesion.

Sinuses/Orbits: No acute finding.

Other: None
IMPRESSION: 1. No acute intracranial pathology.
2. Moderate age-related atrophy and chronic microvascular ischemic
changes.
3. Left frontal meningioma.

## 2021-04-16 MED ORDER — SODIUM ZIRCONIUM CYCLOSILICATE 10 G PO PACK
10.0000 g | PACK | Freq: Once | ORAL | Status: AC
  Administered 2021-04-16: 10 g via ORAL
  Filled 2021-04-16: qty 1

## 2021-04-16 MED ORDER — LABETALOL HCL 5 MG/ML IV SOLN
10.0000 mg | Freq: Once | INTRAVENOUS | Status: AC
  Administered 2021-04-16: 10 mg via INTRAVENOUS
  Filled 2021-04-16: qty 4

## 2021-04-16 NOTE — ED Provider Notes (Signed)
[facility] EMERGENCY DEPARTMENT Provider Note   CSN: [id number] Arrival date & time: 04/16/21  1241     History Chief Complaint  Patient presents with   Hypertension    Thomas Zavala is a 87 y.o. male.  The history is provided by the patient and medical records. No language interpreter was used.  Hypertension   87 year old male seen in history of CLL, hypertension, diabetes presenting with concerns of high blood pressure.  Patient reports several days ago he was having some pain to his left buttock region.  He discussed this with his oncologist and was prescribed some short course of prednisone.  He took the medication 2 days ago, none yesterday and took some this morning.  Patient then checked his blood pressure and noticed that it was elevated with systolic in the [id number].  He checked it a few more times and it was high.  He denies having any associated symptoms.  States no headache, vision changes, confusion, neck pain, chest pain, trouble breathing, abdominal pain or back pain.  He denies significant left leg pain at this time.  Daughter mention 4 days ago patient did complain of some transient bilateral leg weakness and was afraid to walk.  That lasted for about 30 minutes and did resolve.  No associated headache or pain at that time.  Although they attributed his high blood pressure to the recent prednisone use, patient brought here out of abundance of precaution.  Patient has been compliant with office medication.  He denies having any focal numbness or focal weakness.  Past Medical History:  Diagnosis Date   Abdominal hernia    BPH (benign prostatic hyperplasia) 07/25/2016   CLL (chronic lymphocytic leukemia) (HCC)    Heart murmur    Hypertension    Melanoma of forehead ([place])    Melanoma of scalp ([place])    PVC's (premature ventricular contractions)    Type 2 diabetes mellitus without complication, without long-term current use of insulin ([place]) 07/25/2016     Patient Active Problem List   Diagnosis Date Noted   Melanoma of scalp ([place]) 01/05/2020   Melanoma of skin ([place]) 12/10/2019   Corneal guttata 10/26/2018   Cortical age-related cataract of both eyes 10/26/2018   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/2018   Nonexudative age-related macular degeneration, bilateral, early dry stage 10/26/2018   Nuclear sclerotic cataract of both eyes 10/26/2018   Asymptomatic PVCs 01/29/2018   Gross hematuria 01/29/2018   CLL (chronic lymphocytic leukemia) ([place]) 07/25/2016   Thrombocytopenia ([place]) 07/25/2016   Splenomegaly 07/25/2016   HTN (hypertension), benign 07/25/2016   Type 2 diabetes mellitus without complication, without long-term current use of insulin ([place]) 07/25/2016   BPH (benign prostatic hyperplasia) 07/25/2016   Hyperlipidemia 07/25/2016   Lymphocytosis 07/15/2016    Past Surgical History:  Procedure Laterality Date   EYE SURGERY     eye lids   IRRIGATION AND DEBRIDEMENT OF WOUND WITH SPLIT THICKNESS SKIN GRAFT Left 01/05/2020   Procedure: SPLIT THICKNESS SKIN GRAFT Left shoulder;  Surgeon: Wallace Going, DO;  Location: [place];  Service: Plastics;  Laterality: Left;   MASS EXCISION N/A 01/05/2020   Procedure: EXCISION OF FOREHEAD  MELANOMA;  Surgeon: Wallace Going, DO;  Location: [place];  Service: Plastics;  Laterality: N/A;   MELANOMA EXCISION  02/14/2021   Procedure: MELANOMA EXCISION of scalp;  Surgeon: Wallace Going, DO;  Location: [place];  Service: Plastics;;   PAROTIDECTOMY Left 01/05/2020   Procedure:  PAROTIDECTOMY;  Surgeon: Izora Gala, MD;  Location: [place];  Service: ENT;  Laterality: Left;   PROSTATE SURGERY     SENTINEL NODE BIOPSY Left 01/05/2020   Procedure: Sentinel Node Biopsy;  Surgeon: Izora Gala, MD;  Location: [facility];  Service: ENT;  Laterality: Left;       Family History  Problem Relation Age of Onset   Hypertension Father     Social History   Tobacco Use   Smoking  status: Former    Packs/day: 1.00    Years: 25.00    Pack years: 25.00    Types: Cigarettes    Quit date: 1970    Years since quitting: 52.6   Smokeless tobacco: Never   Tobacco comments:    quit in the 70's  Vaping Use   Vaping Use: Never used  Substance Use Topics   Alcohol use: Yes    Alcohol/week: 7.0 standard drinks    Types: 7 Standard drinks or equivalent per week    Comment: socially.   Drug use: No    Home Medications Prior to Admission medications   Medication Sig Start Date End Date Taking? Authorizing Provider  carboxymethylcellul-glycerin (REFRESH OPTIVE) 0.5-0.9 % ophthalmic solution Apply to eye.    [provider]  FLUZONE HIGH-DOSE QUADRIVALENT 0.7 ML SUSY  05/25/20   [provider]  glucose blood (ONETOUCH ULTRA) test strip OneTouch Ultra Blue Test Strip  USE TO TEST BLOOD SUGAR DAILY AS INSTRUCTED    [provider]  glucose blood test strip OneTouch Ultra Blue Test Strip  USE TO TEST BLOOD SUGAR DAILY AS INSTRUCTED    [provider]  losartan-hydrochlorothiazide (HYZAAR) 100-25 MG tablet Take 1 tablet by mouth daily.  07/05/16   [provider]  metFORMIN (GLUCOPHAGE-XR) 500 MG 24 hr tablet Take 500 mg by mouth daily.  07/05/16   [provider]  metoprolol tartrate (LOPRESSOR) 50 MG tablet Take 1 tablet (50 mg total) by mouth 3 (three) times daily. 06/15/20   Cantwell, Celeste C, PA-C  pravastatin (PRAVACHOL) 40 MG tablet Take 40 mg by mouth daily.  07/05/16   [provider]  predniSONE (DELTASONE) 20 MG tablet Take 1 tablet (20 mg total) by mouth daily with breakfast. 04/12/21   Brunetta Genera, MD  traMADol (ULTRAM) 50 MG tablet Take 1 tablet (50 mg total) by mouth every 6 (six) hours as needed. 04/12/21   Brunetta Genera, MD    Allergies    Fluorouracil  Review of Systems   Review of Systems  All other systems reviewed and are negative.  Physical Exam Updated Vital Signs BP  (!) 195/91 (BP Location: Right Arm)   Pulse 75   Temp 98.2 F (36.8 C) (Oral)   Resp 18   SpO2 100%   Physical Exam Vitals and nursing note reviewed.  Constitutional:      General: He is not in acute distress.    Appearance: He is well-developed.  HENT:     Head: Atraumatic.  Eyes:     Extraocular Movements: Extraocular movements intact.     Conjunctiva/sclera: Conjunctivae normal.     Pupils: Pupils are equal, round, and reactive to light.  Cardiovascular:     Rate and Rhythm: Normal rate and regular rhythm.     Pulses: Normal pulses.     Heart sounds: Normal heart sounds.  Pulmonary:     Effort: Pulmonary effort is normal.     Breath sounds: Normal breath sounds.  Abdominal:  Palpations: Abdomen is soft.  Musculoskeletal:     Cervical back: Normal range of motion and neck supple.     Comments: 5 out of 5 strength all 4 extremities  Skin:    Findings: No rash.  Neurological:     Mental Status: He is alert and oriented to person, place, and time.     GCS: GCS eye subscore is 4. GCS verbal subscore is 5. GCS motor subscore is 6.     Cranial Nerves: Cranial nerves are intact.     Sensory: Sensation is intact.     Motor: Motor function is intact.    ED Results / Procedures / Treatments   Labs (all labs ordered are listed, but only abnormal results are displayed) Labs Reviewed  BASIC METABOLIC PANEL - Abnormal; Notable for the following components:      Result Value   Sodium 128 (*)    Potassium 5.4 (*)    Chloride 90 (*)    Glucose, Bld 241 (*)    All other components within normal limits  CBC WITH DIFFERENTIAL/PLATELET - Abnormal; Notable for the following components:   WBC 81.2 (*)    MCV 101.9 (*)    Platelets 122 (*)    Neutro Abs 13.0 (*)    Lymphs Abs 66.6 (*)    Monocytes Absolute 1.6 (*)    All other components within normal limits  URIC ACID  LACTATE DEHYDROGENASE  TROPONIN I (HIGH SENSITIVITY)    EKG None ED ECG REPORT   Date: 04/16/2021   Rate: 83  Rhythm: normal sinus rhythm and sinus arrhythmia  QRS Axis: normal  Intervals: normal  ST/T Wave abnormalities: nonspecific ST changes  Conduction Disutrbances:none  Narrative Interpretation:   Old EKG Reviewed: unchanged  I have personally reviewed the EKG tracing and agree with the computerized printout as noted.   Radiology No results found.  Procedures Procedures   Medications Ordered in ED Medications  sodium zirconium cyclosilicate (LOKELMA) packet 10 g (has no administration in time range)  labetalol (NORMODYNE) injection 10 mg (has no administration in time range)    ED Course  I have reviewed the triage vital signs and the nursing notes.  Pertinent labs & imaging results that were available during my care of the patient were reviewed by me and considered in my medical decision making (see chart for details).    MDM Rules/Calculators/A&P                           BP (!) 205/83   Pulse 73   Temp 98.2 F (36.8 C) (Oral)   Resp 19   SpO2 98%   Final Clinical Impression(s) / ED Diagnoses Final diagnoses:  None    Rx / DC Orders ED Discharge Orders     None      Patient here due to concerns of elevated blood pressure as he notices systolic blood pressure has been in the 200s throughout the day today after he took prednisone for his left sciatica.  Patient does have an elevated blood pressure of 195/91.  He is however without any focal neurodeficit no severe headache and no other complaint.  No chest pain or trouble breathing no obvious Focal neurodeficit.  Did have transient bilateral leg weakness several days prior but that has since resolved.  He has normal strength here  Labs remarkable for sodium of 128, potassium of 5.4, chloride of 98, and glucose of 241.  We will  give Lokelma to manage his hyperkalemic state.  He does have a white count 81 this is secondary to his CLL and recent steroid use.  Will obtain head CT scan, give 10 mg of  labetalol and will monitor.  6:47 PM Patient signed out to Dr. Matilde Sprang who will follow up on imaging and labs results and reassess patient for further management.   Domenic Moras, PA-C 04/16/21 1848    Teressa Lower, MD 04/17/21 [phone]

## 2021-04-16 NOTE — ED Triage Notes (Signed)
Pt reports checking bp at home and it was elevated. Has no specific complaints, reports taking his meds as prescribed today. BP 195/91 at triage.

## 2021-04-16 NOTE — ED Provider Notes (Signed)
Emergency Medicine Provider Triage Evaluation Note  Thomas Zavala , a 87 y.o. male  was evaluated in triage.  Pt complains of high blood pressure.  Got a reading of 209/98.  No headache, chest pain, difficulty breathing, visual changes, back pain.  Has been taking all of his medications.  Review of Systems  Positive: HTN Negative: headache, chest pain, difficulty breathing, visual changes, back pain.  Physical Exam  BP (!) 195/91 (BP Location: Right Arm)   Pulse 75   Temp 98.2 F (36.8 C) (Oral)   Resp 18   SpO2 100%  Gen:   Awake, no distress   Resp:  Normal effort  Cardio:  RRR MSK:   Moves extremities without difficulty  Other:    Medical Decision Making  Medically screening exam initiated at 1:54 PM.  Appropriate orders placed.  Thomas Zavala was informed that the remainder of the evaluation will be completed by another provider, this initial triage assessment does not replace that evaluation, and the importance of remaining in the ED until their evaluation is complete.  Elevated blood pressure here.  No hypertensive urgency or emergency.  No signs of organ dysfunction.   Rhae Hammock, PA-C 04/16/21 1411    Godfrey Pick, MD 04/18/21 [phone]

## 2021-04-16 NOTE — Discharge Instructions (Addendum)
You were seen in emergency department for evaluation of high blood pressure.  Your labs today show an elevated potassium which we treated with an oral medication and this is likely due to your recent prednisone use and known CLL.  The prednisone is also likely the cause of your high blood pressure at this time and you are safe for discharge.  Your labs show no evidence of organ damage.  Please follow-up with your oncologist or primary care physician and return to the emergency department if you have new or worsening chest pain, shortness of breath, vomiting, abdominal pain or any other concerning symptoms.

## 2021-04-17 ENCOUNTER — Encounter: Payer: Self-pay | Admitting: Plastic Surgery

## 2021-04-17 ENCOUNTER — Ambulatory Visit (INDEPENDENT_AMBULATORY_CARE_PROVIDER_SITE_OTHER): Payer: PPO | Admitting: Plastic Surgery

## 2021-04-17 DIAGNOSIS — L989 Disorder of the skin and subcutaneous tissue, unspecified: Secondary | ICD-10-CM | POA: Diagnosis not present

## 2021-04-17 NOTE — Progress Notes (Signed)
   Subjective:    [name], male    DOB: Jun 25, 1928, 87 y.o.   MRN: [id number]  The patient is a very nice 87 year old male here for evaluation of a changing skin lesion on his forehead.  He has had multiple skin cancers in the past which we have excised and covered.  This area has been present for couple months.  It might be getting a little bit bigger and he does not want to ignore it.  It is about a half a centimeter and looks more like a cyst but it could certainly be another skin cancer.  Its not ulcerating and it has a slight blue discoloration to it.  It does not appear infected or irritated.  He was in the emergency room yesterday with elevated blood pressure so he is a little bit tired today which is certainly understandable.   Review of Systems  Constitutional: Negative.   HENT: Negative.    Eyes: Negative.   Respiratory: Negative.    Cardiovascular: Negative.   Endocrine: Negative.   Genitourinary: Negative.   Musculoskeletal:  Positive for gait problem.  Hematological: Negative.       Objective:   Physical Exam Nursing note reviewed.  HENT:     Head:   Cardiovascular:     Rate and Rhythm: Normal rate.     Pulses: Normal pulses.  Pulmonary:     Effort: Pulmonary effort is normal. No respiratory distress.  Skin:    Capillary Refill: Capillary refill takes less than 2 seconds.     Coloration: Skin is not jaundiced.     Findings: Lesion present. No bruising.  Neurological:     Mental Status: He is alert. Mental status is at baseline.  Psychiatric:        Mood and Affect: Mood normal.        Behavior: Behavior normal.       Assessment & Plan:     ICD-10-CM   1. Changing skin lesion  L98.9     I gave the patient the option of giving it time and watching it.  He would like to go ahead and at least get it scheduled for excision changing skin lesion forehead next month.    Pictures were obtained of the patient and placed in the chart with the  patient's or guardian's permission.

## 2021-04-19 NOTE — Progress Notes (Signed)
HEMATOLOGY/ONCOLOGY CLINIC  Date of Service: .04/12/2021   Patient Care Team: Zavala, Thomas Grana, MD as PCP - General (Family Medicine)  CHIEF COMPLAINTS/PURPOSE OF CONSULTATION:  Chronic Lymphocytic Leukemia Recently diagnosed Melanoma  HISTORY OF PRESENTING ILLNESS:   Thomas Zavala is a wonderful 87 y.o. male who has been referred to [place] by Thomas Bienenstock, NP for evaluation and management of his Chronic Lymphocytic Leukemia. The patient has been seen by Dr. Murriel Zavala in the past. He is accompanied today by his daughter. The pt reports that he is doing well overall.   The pt reports that he had ease of bruising in 2017 at which time he sought out a physical and was observed to have lymphocytosis and was diagnosed with CLL in November 2017. The pt denies having any other symptoms at that time. The pt has not had treatment for his CLL.  The pt notes that he has had some sweating at night between his legs, and feelings of flushness, which he notes started "in the last week or so." The pt denies fevers, chills, and noticing any new lumps or bumps. He has had recurrent squamous cell carcinomas for "several years," in sun-exposed areas. He follows with his dermatologist regularly for skin screenings.  The pt notes that his energy levels have diminished somewhat over the last 3 years, but denies fatigue being a limiting factor. He recently went on a 2 mile hike with his daughter. He has a one level living arrangement. The pt denies frequent or recurrent infections.   The pt notes that he follows with Dr. Gloriann Zavala in urology for occasional hematuria. He notes that his DM and blood pressure have been well controlled.   The pt notes that he has had both of his pneumonia vaccines in the last 5 years and received his flu shot this season.  Most recent lab results (10/21/18) of CBC w/diff is as follows: all values are WNL except for WBC at WBC at 51.5k, PLT at 73k, Lymphs abs at 46.5k, Monocytes abs  at 1.9k. 10/21/18 CMP is pending 10/21/18 LDH is pending  On review of systems, pt reports staying active, eating well, mildly lower energy levels, and denies drenching night sweats, fevers, new fatigue, chills, unexpected weight loss, frequent infections, recurrent infections, leg swelling, abdominal pains, and any other symptoms.  On PMHx the pt reports CLL, DM type II.   INTERVAL HISTORY:   Thomas Zavala returns today for management and evaluation of his CLL and his melanoma. The patient's last visit with [place] was on 0 01/16/2021. The pt reports that he is doing well overall. We are joined today by his daughter.  Since our last visit he saw Dr. Marla Zavala and had another melanoma lesion resected from the top of his head.  Patient notes no fevers no chills no night sweats no other acute new focal symptoms.  On review of systems, pt reports no other acute new focal symptoms.  MEDICAL HISTORY:  Past Medical History:  Diagnosis Date   Abdominal hernia    BPH (benign prostatic hyperplasia) 07/25/2016   CLL (chronic lymphocytic leukemia) (HCC)    Heart murmur    Hypertension    Melanoma of forehead ([place])    Melanoma of scalp (HCC)    PVC's (premature ventricular contractions)    Type 2 diabetes mellitus without complication, without long-term current use of insulin ([place]) 07/25/2016    SURGICAL HISTORY: Past Surgical History:  Procedure Laterality Date   EYE SURGERY  eye lids   IRRIGATION AND DEBRIDEMENT OF WOUND WITH SPLIT THICKNESS SKIN GRAFT Left 01/05/2020   Procedure: SPLIT THICKNESS SKIN GRAFT Left shoulder;  Surgeon: Wallace Going, DO;  Location: [place];  Service: Plastics;  Laterality: Left;   MASS EXCISION N/A 01/05/2020   Procedure: EXCISION OF FOREHEAD  MELANOMA;  Surgeon: Wallace Going, DO;  Location: [place];  Service: Plastics;  Laterality: N/A;   MELANOMA EXCISION  02/14/2021   Procedure: MELANOMA EXCISION of scalp;  Surgeon: Wallace Going, DO;   Location: [place];  Service: Plastics;;   PAROTIDECTOMY Left 01/05/2020   Procedure: PAROTIDECTOMY;  Surgeon: Izora Gala, MD;  Location: [place];  Service: ENT;  Laterality: Left;   PROSTATE SURGERY     SENTINEL NODE BIOPSY Left 01/05/2020   Procedure: Sentinel Node Biopsy;  Surgeon: Izora Gala, MD;  Location: [facility];  Service: ENT;  Laterality: Left;    SOCIAL HISTORY: Social History   Socioeconomic History   Marital status: Widowed    Spouse name: Not on file   Number of children: 3   Years of education: Not on file   Highest education level: Not on file  Occupational History   Not on file  Tobacco Use   Smoking status: Former    Packs/day: 1.00    Years: 25.00    Pack years: 25.00    Types: Cigarettes    Quit date: 51    Years since quitting: 52.7   Smokeless tobacco: Never   Tobacco comments:    quit in the 70's  Vaping Use   Vaping Use: Never used  Substance and Sexual Activity   Alcohol use: Yes    Alcohol/week: 7.0 standard drinks    Types: 7 Standard drinks or equivalent per week    Comment: socially.   Drug use: No   Sexual activity: Not on file  Other Topics Concern   Not on file  Social History Narrative   Not on file   Social Determinants of Health   Financial Resource Strain: Not on file  Food Insecurity: Not on file  Transportation Needs: Not on file  Physical Activity: Not on file  Stress: Not on file  Social Connections: Not on file  Intimate Partner Violence: Not on file    FAMILY HISTORY: Family History  Problem Relation Age of Onset   Hypertension Father     ALLERGIES:  is allergic to tape and fluorouracil.  MEDICATIONS:  Current Outpatient Medications  Medication Sig Dispense Refill   predniSONE (DELTASONE) 20 MG tablet Take 1 tablet (20 mg total) by mouth daily with breakfast. 5 tablet 0   traMADol (ULTRAM) 50 MG tablet Take 1 tablet (50 mg total) by mouth every 6 (six) hours as needed. (Patient taking differently: Take 50 mg by  mouth every 6 (six) hours as needed (for pain).) 30 tablet 0   acetaminophen (TYLENOL) 500 MG tablet Take 500-1,000 mg by mouth every 6 (six) hours as needed (for pain).     carboxymethylcellul-glycerin (REFRESH OPTIVE) 0.5-0.9 % ophthalmic solution Place 1 drop into both eyes 3 (three) times daily as needed for dry eyes.     glucose blood (ONETOUCH ULTRA) test strip OneTouch Ultra Blue Test Strip  USE TO TEST BLOOD SUGAR DAILY AS INSTRUCTED     glucose blood test strip OneTouch Ultra Blue Test Strip  USE TO TEST BLOOD SUGAR DAILY AS INSTRUCTED     loratadine (CLARITIN) 10 MG tablet Take 10 mg by mouth daily as  needed for allergies or rhinitis.     losartan-hydrochlorothiazide (HYZAAR) 100-25 MG tablet Take 1 tablet by mouth daily.      metFORMIN (GLUCOPHAGE-XR) 500 MG 24 hr tablet Take 500 mg by mouth daily with breakfast.     metoprolol tartrate (LOPRESSOR) 50 MG tablet Take 1 tablet (50 mg total) by mouth 3 (three) times daily. 270 tablet 3   pravastatin (PRAVACHOL) 40 MG tablet Take 40 mg by mouth daily.      No current facility-administered medications for this visit.    REVIEW OF SYSTEMS:   .10 Point review of Systems was done is negative except as noted above.   PHYSICAL EXAMINATION: ECOG PERFORMANCE STATUS: 2 - Symptomatic, <50% confined to bed  . Vitals:   04/12/21 1346  BP: (!) 162/65  Pulse: 63  Resp: 18  Temp: 97.9 F (36.6 C)  SpO2: 99%   Filed Weights   04/12/21 1346  Weight: 177 lb 1.6 oz (80.3 kg)   Body mass index is 24.7 kg/m.   Exam was given in a chair.  [name] GENERAL:alert, in no acute distress and comfortable SKIN: no acute rashes, no significant lesions EYES: conjunctiva are pink and non-injected, sclera anicteric OROPHARYNX: MMM, no exudates, no oropharyngeal erythema or ulceration NECK: supple, no JVD LYMPH:  no palpable lymphadenopathy in the cervical, axillary or inguinal regions LUNGS: clear to auscultation b/l with normal respiratory  effort HEART: regular rate & rhythm ABDOMEN:  normoactive bowel sounds , non tender, not distended. Extremity: no pedal edema PSYCH: alert & oriented x 3 with fluent speech NEURO: no focal motor/sensory deficits   LABORATORY DATA:  I have reviewed the data as listed  . CBC Latest Ref Rng & Units 04/16/2021 04/12/2021 11/24/2020  WBC 4.0 - 10.5 K/uL 81.2(HH) 54.2(HH) 74.8(HH)  Hemoglobin 13.0 - 17.0 g/dL 14.2 13.5 13.5  Hematocrit 39.0 - 52.0 % 43.5 39.6 40.7  Platelets 150 - 400 K/uL 122(L) 68(L) 90(L)    . CMP Latest Ref Rng & Units 04/16/2021 04/12/2021 02/14/2021  Glucose 70 - 99 mg/dL 241(H) 144(H) 147(H)  BUN 8 - 23 mg/dL 19 17 6(L)  Creatinine 0.61 - 1.24 mg/dL 1.08 1.15 1.00  Sodium 135 - 145 mmol/L 128(L) 132(L) 122(L)  Potassium 3.5 - 5.1 mmol/L 5.4(H) 3.3(L) 4.6  Chloride 98 - 111 mmol/L 90(L) 95(L) 88(L)  CO2 22 - 32 mmol/L '28 29 25  '$ Calcium 8.9 - 10.3 mg/dL 9.2 9.0 8.8(L)  Total Protein 6.5 - 8.1 g/dL - 6.1(L) -  Total Bilirubin 0.3 - 1.2 mg/dL - 0.9 -  Alkaline Phos 38 - 126 U/L - 98 -  AST 15 - 41 U/L - 17 -  ALT 0 - 44 U/L - 10 -    07/15/16 Cytogenetics and Flow Cytometry:   11/12/19 of Molecular Pathology   01/05/20 of Surgical Pathology Report [id number])  RADIOGRAPHIC STUDIES: I have personally reviewed the radiological images as listed and agreed with the findings in the report. CT HEAD WO CONTRAST (5MM)  Result Date: 04/16/2021 CLINICAL DATA:  Transient ischemic attack.  Weakness. EXAM: CT HEAD WITHOUT CONTRAST TECHNIQUE: Contiguous axial images were obtained from the base of the skull through the vertex without intravenous contrast. COMPARISON:  Brain MRI dated 02/08/2020. FINDINGS: Evaluation of this exam is limited due to motion artifact. Brain: Moderate age-related atrophy and chronic microvascular ischemic changes. There is no acute intracranial hemorrhage. No mass effect or midline shift. No extra-axial fluid collection. There is a 1.2 x 0.6 cm  dural-based  lesion along the left frontal lobe (22/3) compatible with a meningioma. Vascular: No hyperdense vessel or unexpected calcification. Skull: Normal. Negative for fracture or focal lesion. Sinuses/Orbits: No acute finding. Other: None IMPRESSION: 1. No acute intracranial pathology. 2. Moderate age-related atrophy and chronic microvascular ischemic changes. 3. Left frontal meningioma. Electronically Signed   By: Anner Crete M.D.   On: 04/16/2021 20:14     ASSESSMENT & PLAN:  87 y.o. male with  1. Chronic Lymphocytic Leukemia Diagnosed in November 2017 with flow cytometry 07/15/16 Cytogenetics revealed Trisomy 12  09/03/16 CT A/P revealed Borderline splenomegaly with 1.3 cm low-attenuation lesion in the anterior aspect of spleen. Mild upper abdominal and right iliac lymphadenopathy. 4 mm indeterminate pulmonary nodule in right lung base. Recommend continued attention on follow-up CT. Incidentally noted benign right hepatic lobe hemangioma, mildly and enlarged prostate, and colonic diverticulosis  10/10/17 CT Chest revealed Scattered small bilateral pulmonary nodules, including two dominant right middle lobe nodules measuring 5-6 mm, unchanged, likely benign. If clinically warranted, consider a single follow-up CT chest in 1 year. Mild mediastinal and bilateral hilar lymphadenopathy, likely related to the patient's known CLL. Mild splenomegaly, incompletely visualized. Aortic Atherosclerosis and Emphysema  2. 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  patient declined adjuvant immunotheraphy  PLAN: -I reviewed patient's surgical pathology with her recurrent resected melanoma.  Still prefers to hold off on adjuvant immunotherapy at this time . -Labs related to CLL reviewed with the patient and his daughter.  WBC counts 54k platelets still low at 68k. CMP stable -No acute new indication to treat the patient's CLL is still meets  of criteria for treatment but would like to hold off at this time. -He will continue to follow with dermatology and Dr. BElisabeth Carafor new focal skin melanoma recurrences and other nonmelanoma skin cancers.  FOLLOW UP: RTC with Dr KIrene Limbowith labs in 3 months   . The total time spent in the appointment was 32 minutes and more than 50% was on counseling and direct patient cares.   All of the patient's questions were answered with apparent satisfaction. The patient knows to call the clinic with any problems, questions or concerns.   GSullivan LoneMD MSmithfieldAAHIVMS SLivingston HealthcareCWest Asc LLCHematology/Oncology Physician CCarol[facility] (Office):       [phone](Work cell):  [phone](Fax):           [phone]

## 2021-05-28 ENCOUNTER — Ambulatory Visit (INDEPENDENT_AMBULATORY_CARE_PROVIDER_SITE_OTHER): Payer: PPO | Admitting: Podiatry

## 2021-05-28 ENCOUNTER — Encounter: Payer: Self-pay | Admitting: Podiatry

## 2021-05-28 ENCOUNTER — Other Ambulatory Visit: Payer: Self-pay

## 2021-05-28 DIAGNOSIS — M79674 Pain in right toe(s): Secondary | ICD-10-CM | POA: Diagnosis not present

## 2021-05-28 DIAGNOSIS — M79675 Pain in left toe(s): Secondary | ICD-10-CM | POA: Diagnosis not present

## 2021-05-28 DIAGNOSIS — B351 Tinea unguium: Secondary | ICD-10-CM

## 2021-05-28 DIAGNOSIS — E119 Type 2 diabetes mellitus without complications: Secondary | ICD-10-CM

## 2021-06-02 NOTE — Progress Notes (Signed)
  Subjective:  [name], male    DOB: 09-24-1927,  MRN: [id number]  Thomas Zavala presents to clinic today for preventative diabetic foot care and thick, elongated toenails b/l feet which are tender when wearing enclosed shoe gear.   Patient does not monitor blood glucose daily.  PCP is Corrington, Kip A, MD , and last visit was 12/21/2020.  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 Objective:   Constitutional Thomas Zavala is a pleasant 87 y.o. Caucasian male, WD, WN in NAD. AAO x 3.   Vascular Capillary refill time to digits immediate b/l. Palpable pedal pulses b/l LE. Pedal hair sparse. Lower extremity skin temperature gradient within normal limits. No pain with calf compression b/l. Trace edema noted b/l lower extremities. No cyanosis or clubbing noted.  Neurologic Normal speech. Oriented to person, place, and time. Protective sensation intact 5/5 intact bilaterally with 10g monofilament b/l.  Dermatologic Pedal skin thin, shiny and atrophic b/l LE. Right hallux nailplate growing in nicely. No open wounds b/l lower extremities. No interdigital macerations b/l lower extremities. Toenails 2-5 bilaterally and L hallux elongated, discolored, dystrophic, thickened, and crumbly with subungual debris and tenderness to dorsal palpation.  Orthopedic: Normal muscle strength 5/5 to all lower extremity muscle groups bilaterally. No pain crepitus or joint limitation noted with ROM b/l lower extremities. No gross bony deformities b/l lower extremities.   Radiographs: None Assessment:   1. Pain due to onychomycosis of toenails of both feet   2. Type 2 diabetes mellitus without complication, without long-term current use of  insulin ([place])    Plan:  Patient was evaluated and treated and all questions answered. Consent given for treatment as described below: -No new findings. No new orders. -Right hallux nail plate growing back in nicely. -Continue diabetic foot care principles: inspect feet daily, monitor glucose as recommended by PCP and/or Endocrinologist, and follow prescribed diet per PCP, Endocrinologist and/or dietician. -Patient to continue soft, supportive shoe gear daily. -Toenails 2-5 bilaterally and L hallux debrided in length and girth without iatrogenic bleeding with sterile nail nipper and dremel.  -Patient to report any pedal injuries to medical professional immediately. -Patient/POA to call should there be question/concern in the interim.  Return in about 3 months (around 08/28/2021).  Marzetta Board, DPM
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
## 2021-06-15 ENCOUNTER — Ambulatory Visit: Payer: PPO | Admitting: Student

## 2021-06-15 ENCOUNTER — Encounter: Payer: Self-pay | Admitting: Student

## 2021-06-15 ENCOUNTER — Other Ambulatory Visit: Payer: Self-pay

## 2021-06-15 DIAGNOSIS — I10 Essential (primary) hypertension: Secondary | ICD-10-CM | POA: Diagnosis not present

## 2021-06-15 DIAGNOSIS — I493 Ventricular premature depolarization: Secondary | ICD-10-CM

## 2021-06-15 MED ORDER — HYDRALAZINE HCL 25 MG PO TABS: 25.0000 mg | ORAL_TABLET | Freq: Three times a day (TID) | ORAL | 3 refills | Status: DC

## 2021-06-19 ENCOUNTER — Ambulatory Visit: Payer: PPO | Admitting: Plastic Surgery

## 2021-07-10 ENCOUNTER — Telehealth: Payer: Self-pay | Admitting: Hematology

## 2021-07-10 ENCOUNTER — Other Ambulatory Visit: Payer: Self-pay

## 2021-07-10 DIAGNOSIS — C9110 Chronic lymphocytic leukemia of B-cell type not having achieved remission: Secondary | ICD-10-CM

## 2021-07-10 DIAGNOSIS — U071 COVID-19: Secondary | ICD-10-CM | POA: Diagnosis not present

## 2021-07-10 NOTE — Telephone Encounter (Signed)
Per sch msg, patient wants to r/s 11/23 appt. Called and left msg for patient to call back if still needing to r/s

## 2021-07-11 ENCOUNTER — Inpatient Hospital Stay: Payer: PPO

## 2021-07-11 ENCOUNTER — Inpatient Hospital Stay: Payer: PPO | Attending: Hematology | Admitting: Hematology

## 2021-07-17 ENCOUNTER — Ambulatory Visit: Payer: PPO | Admitting: Student

## 2021-07-26 ENCOUNTER — Emergency Department (HOSPITAL_COMMUNITY): Payer: PPO

## 2021-07-26 ENCOUNTER — Emergency Department (HOSPITAL_COMMUNITY)
Admission: EM | Admit: 2021-07-26 | Discharge: 2021-07-26 | Disposition: A | Discharge status: Home / Self Care | Payer: PPO | Attending: Emergency Medicine | Admitting: Emergency Medicine

## 2021-07-26 ENCOUNTER — Encounter (HOSPITAL_COMMUNITY): Payer: Self-pay

## 2021-07-26 ENCOUNTER — Other Ambulatory Visit: Payer: Self-pay

## 2021-07-26 DIAGNOSIS — R5383 Other fatigue: Secondary | ICD-10-CM

## 2021-07-26 DIAGNOSIS — Z85820 Personal history of malignant melanoma of skin: Secondary | ICD-10-CM | POA: Diagnosis not present

## 2021-07-26 DIAGNOSIS — Z87891 Personal history of nicotine dependence: Secondary | ICD-10-CM | POA: Insufficient documentation

## 2021-07-26 DIAGNOSIS — R0602 Shortness of breath: Secondary | ICD-10-CM | POA: Diagnosis not present

## 2021-07-26 DIAGNOSIS — E119 Type 2 diabetes mellitus without complications: Secondary | ICD-10-CM | POA: Diagnosis not present

## 2021-07-26 DIAGNOSIS — R059 Cough, unspecified: Secondary | ICD-10-CM | POA: Insufficient documentation

## 2021-07-26 DIAGNOSIS — I498 Other specified cardiac arrhythmias: Secondary | ICD-10-CM | POA: Diagnosis present

## 2021-07-26 DIAGNOSIS — R000 Tachycardia, unspecified: Secondary | ICD-10-CM | POA: Diagnosis not present

## 2021-07-26 DIAGNOSIS — I10 Essential (primary) hypertension: Secondary | ICD-10-CM | POA: Diagnosis not present

## 2021-07-26 DIAGNOSIS — U071 COVID-19: Secondary | ICD-10-CM | POA: Insufficient documentation

## 2021-07-26 DIAGNOSIS — I480 Paroxysmal atrial fibrillation: Secondary | ICD-10-CM | POA: Insufficient documentation

## 2021-07-26 DIAGNOSIS — I493 Ventricular premature depolarization: Secondary | ICD-10-CM

## 2021-07-26 DIAGNOSIS — Z79899 Other long term (current) drug therapy: Secondary | ICD-10-CM | POA: Insufficient documentation

## 2021-07-26 DIAGNOSIS — R531 Weakness: Secondary | ICD-10-CM | POA: Diagnosis not present

## 2021-07-26 DIAGNOSIS — R002 Palpitations: Secondary | ICD-10-CM | POA: Diagnosis not present

## 2021-07-26 DIAGNOSIS — R2243 Localized swelling, mass and lump, lower limb, bilateral: Secondary | ICD-10-CM | POA: Diagnosis not present

## 2021-07-26 DIAGNOSIS — C9110 Chronic lymphocytic leukemia of B-cell type not having achieved remission: Secondary | ICD-10-CM | POA: Diagnosis not present

## 2021-07-26 DIAGNOSIS — E782 Mixed hyperlipidemia: Secondary | ICD-10-CM | POA: Diagnosis not present

## 2021-07-26 DIAGNOSIS — Z0000 Encounter for general adult medical examination without abnormal findings: Secondary | ICD-10-CM | POA: Diagnosis not present

## 2021-07-26 DIAGNOSIS — E11628 Type 2 diabetes mellitus with other skin complications: Secondary | ICD-10-CM | POA: Diagnosis not present

## 2021-07-26 DIAGNOSIS — D696 Thrombocytopenia, unspecified: Secondary | ICD-10-CM | POA: Diagnosis not present

## 2021-07-26 LAB — MAGNESIUM: Magnesium: 1.7 mg/dL (ref 1.7–2.4)

## 2021-07-26 LAB — BASIC METABOLIC PANEL
Anion gap: 11 (ref 5–15)
BUN: 20 mg/dL (ref 8–23)
CO2: 22 mmol/L (ref 22–32)
Calcium: 8.7 mg/dL — ABNORMAL LOW (ref 8.9–10.3)
Chloride: 95 mmol/L — ABNORMAL LOW (ref 98–111)
Creatinine, Ser: 1.29 mg/dL — ABNORMAL HIGH (ref 0.61–1.24)
GFR, Estimated: 52 mL/min — ABNORMAL LOW (ref >60)
Glucose, Bld: 171 mg/dL — ABNORMAL HIGH (ref 70–99)
Potassium: 4.4 mmol/L (ref 3.5–5.1)
Sodium: 128 mmol/L — ABNORMAL LOW (ref 135–145)

## 2021-07-26 LAB — CBC
HCT: 37.8 % — ABNORMAL LOW (ref 39.0–52.0)
Hemoglobin: 12.7 g/dL — ABNORMAL LOW (ref 13.0–17.0)
MCH: 33 pg (ref 26.0–34.0)
MCHC: 33.6 g/dL (ref 30.0–36.0)
MCV: 98.2 fL (ref 80.0–100.0)
Platelets: 154 10*3/uL (ref 150–400)
RBC: 3.85 MIL/uL — ABNORMAL LOW (ref 4.22–5.81)
RDW: 14 % (ref 11.5–15.5)
WBC: 51.3 10*3/uL (ref 4.0–10.5)
nRBC: 0 % (ref 0.0–0.2)

## 2021-07-26 LAB — BRAIN NATRIURETIC PEPTIDE: B Natriuretic Peptide: 92 pg/mL (ref 0.0–100.0)

## 2021-07-26 LAB — TROPONIN I (HIGH SENSITIVITY)
Troponin I (High Sensitivity): 15 ng/L (ref <18)
Troponin I (High Sensitivity): 14 ng/L (ref <18)

## 2021-07-26 LAB — LACTIC ACID, PLASMA: Lactic Acid, Venous: 1.3 mmol/L (ref 0.5–1.9)

## 2021-07-26 LAB — RESP PANEL BY RT-PCR (FLU A&B, COVID) ARPGX2
Influenza A by PCR: NEGATIVE
Influenza B by PCR: NEGATIVE
SARS Coronavirus 2 by RT PCR: POSITIVE — AB

## 2021-07-26 LAB — TSH: TSH: 0.875 u[IU]/mL (ref 0.350–4.500)

## 2021-07-26 IMAGING — CT CT ANGIO CHEST
2 of 6 series · 17 of 46 positions shown · IV contrast (omnipaque)
Comparison: Chest x-ray from earlier in the same day, PET-CT from
[DATE], CT from [DATE].

CLINICAL DATA: Tachycardia, history of prior COVID infection,
initial encounter

EXAM:
CT ANGIOGRAPHY CHEST WITH CONTRAST
TECHNIQUE: Multidetector CT imaging of the chest was performed using the
standard protocol during bolus administration of intravenous
contrast. Multiplanar CT image reconstructions and MIPs were
obtained to evaluate the vascular anatomy.
CONTRAST:  75mL OMNIPAQUE IOHEXOL 350 MG/ML SOLN

[Series 6: thins · axial · 0.72mm/px · z∈[+1248,+1510]mm · 14 of 289 slices shown]
[im 13/289  lung]
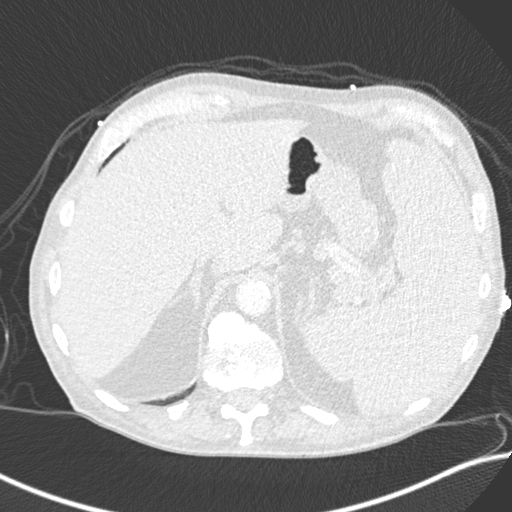
[im 38/289  soft-tissue]
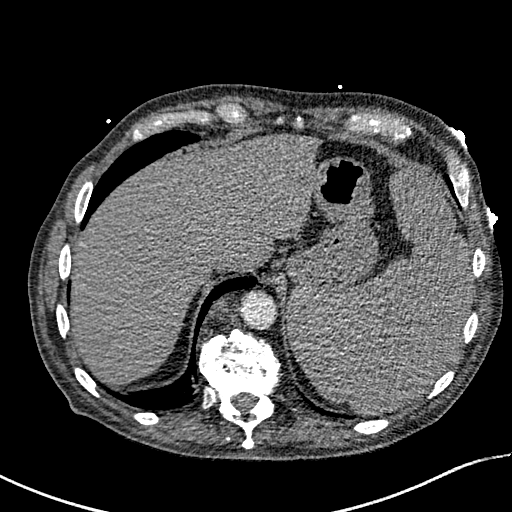
[im 51/289  lung]
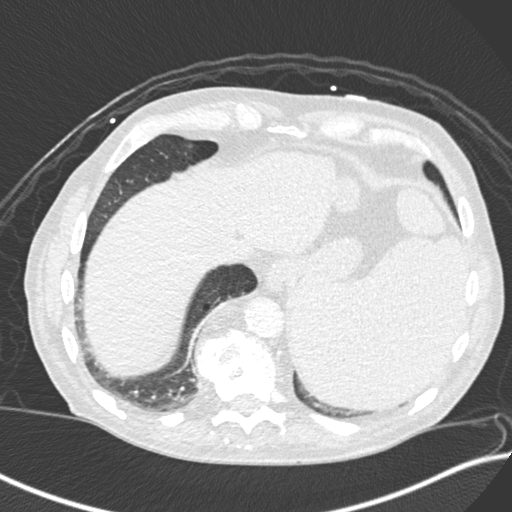
[im 76/289  soft-tissue]
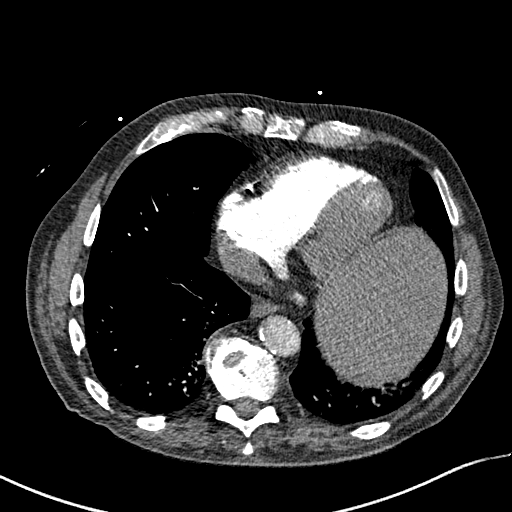
[im 101/289  lung]
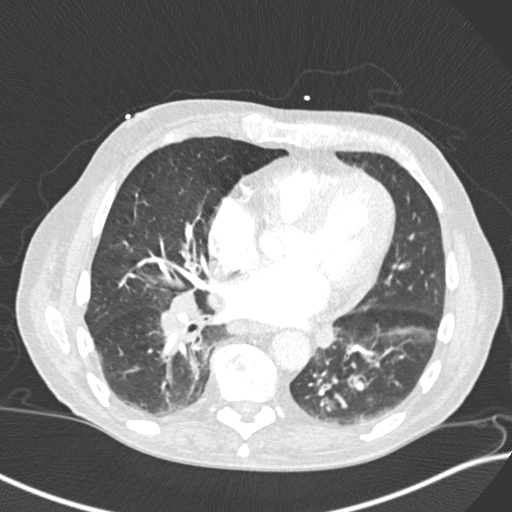
[im 113/289  soft-tissue]
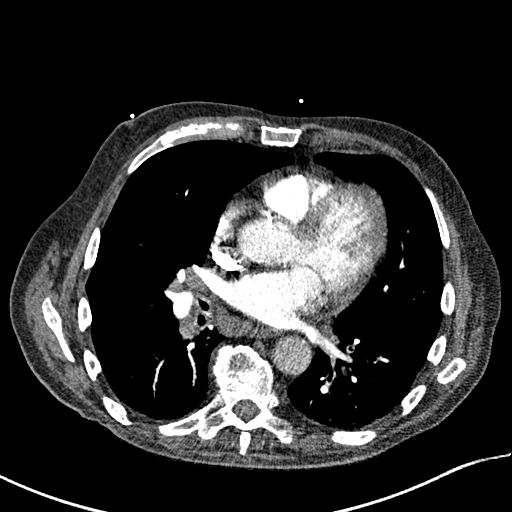
[im 138/289  lung]
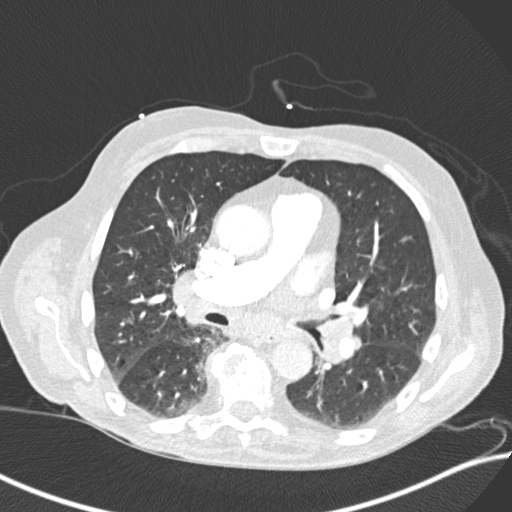
[im 151/289  soft-tissue]
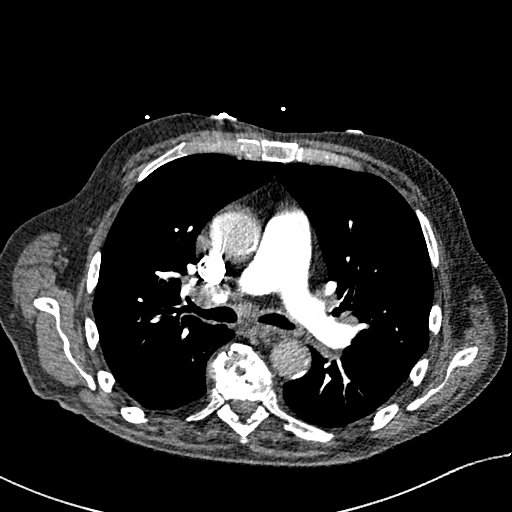
[im 176/289  lung]
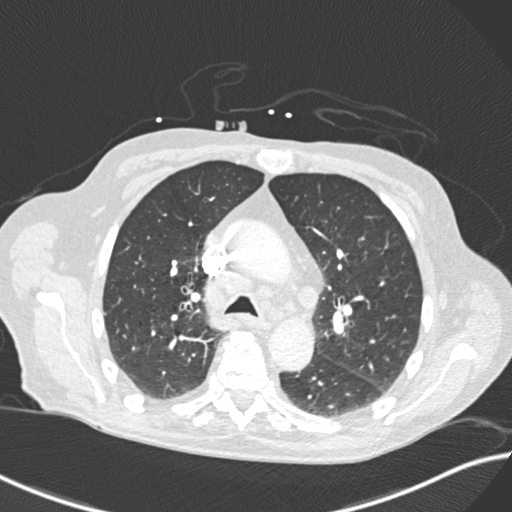
[im 188/289  soft-tissue]
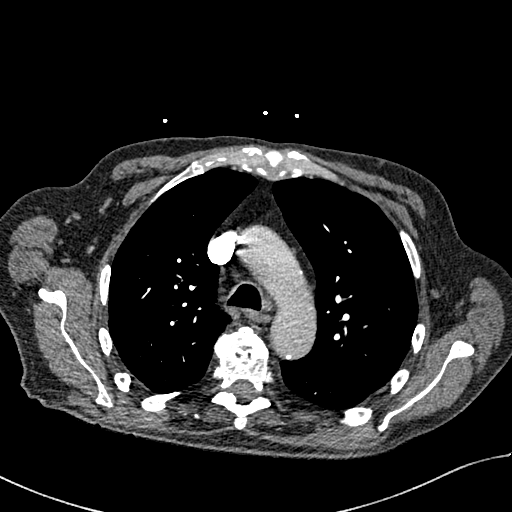
[im 213/289  lung]
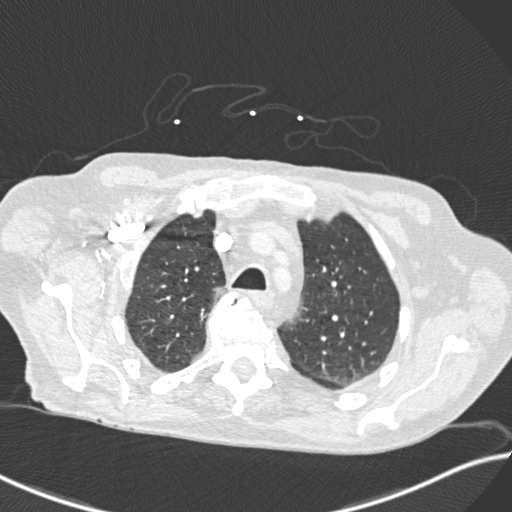
[im 238/289  soft-tissue]
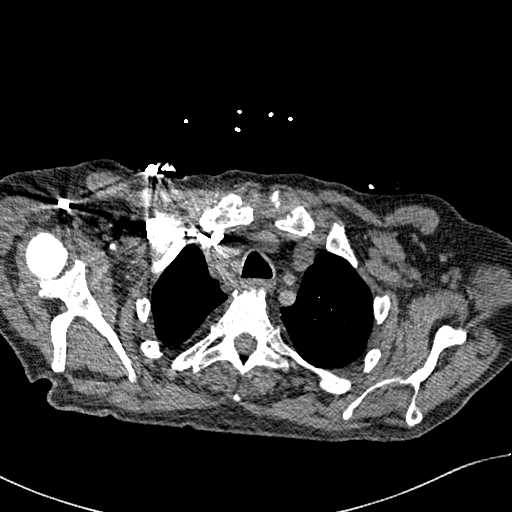
[im 251/289  lung]
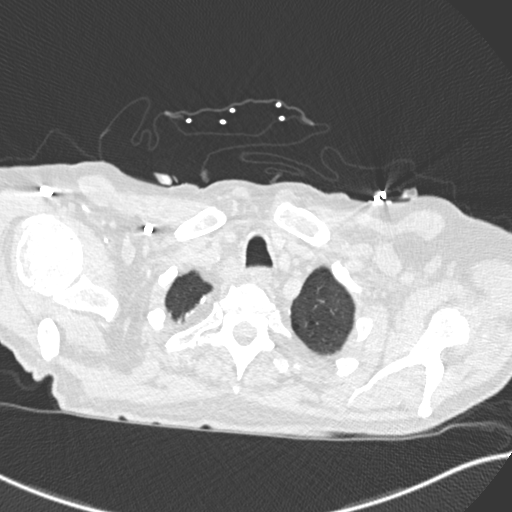
[im 276/289  soft-tissue]
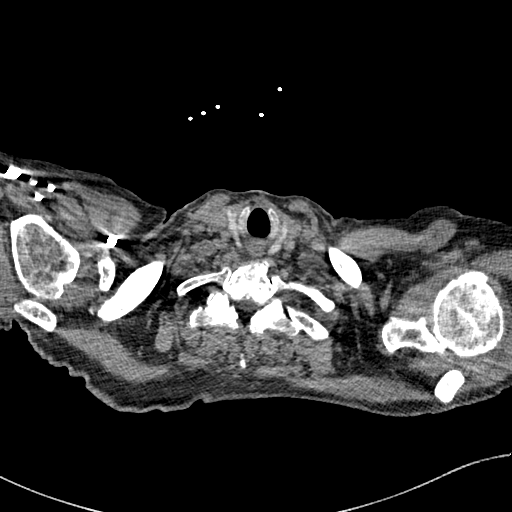

[Series 8: coronal mpr · coronal · 0.56mm/px · 3 of 149 slices shown]
[im 38/149  soft-tissue]
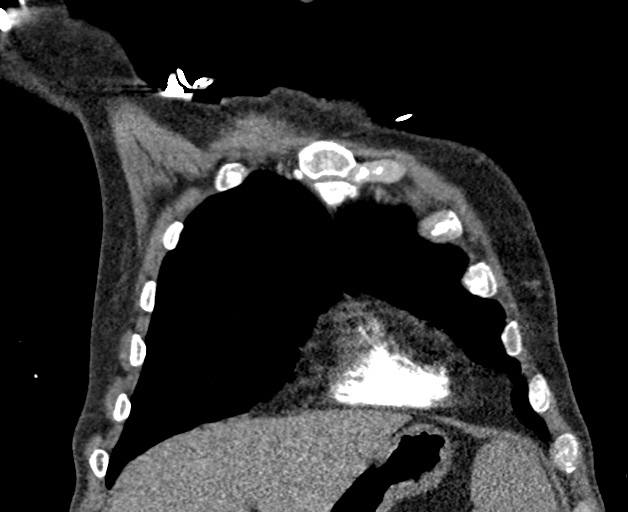
[im 75/149  soft-tissue]
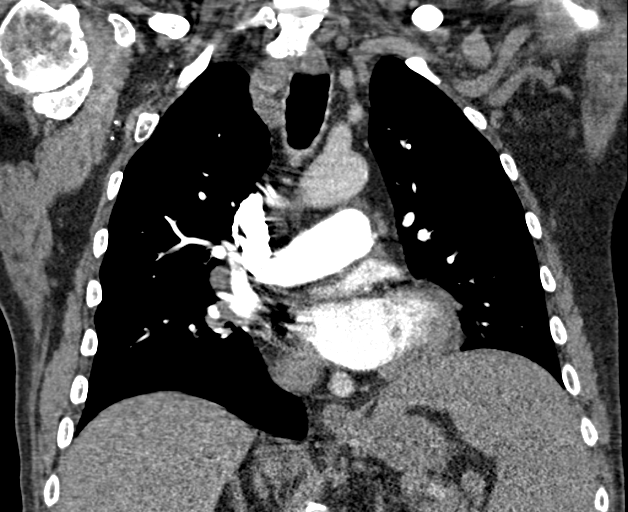
[im 112/149  soft-tissue]
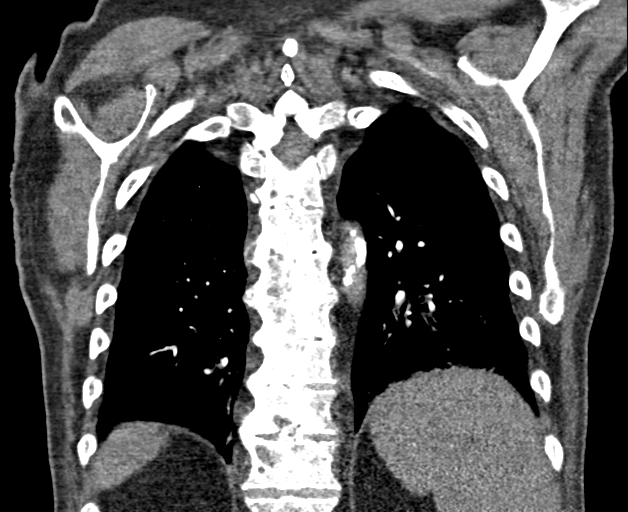

[17 of 46 positions shown; findings below may reference images not displayed]

FINDINGS: Cardiovascular: Thoracic aorta and its branches demonstrate
atherosclerotic calcification. No aneurysmal dilatation or
dissection is noted. The pulmonary artery shows a normal branching
pattern bilaterally. No filling defects are identified to suggest
pulmonary embolism. Coronary calcifications are noted.

Mediastinum/Nodes: Thoracic inlet is within normal limits. The
esophagus is within normal limits. Scattered lymphadenopathy is
noted within the mediastinum increased from the prior exam. A 14 mm
short axis right paratracheal lymph node is seen. 11 mm short axis
AP window node is noted. 16 mm short axis subcarinal node is seen.
Bilateral symmetrical hilar adenopathy is noted as well. These
measure up to 16 mm in the right hilum and 11 mm in the left hilum.
These are increased from the prior exam as well.

Lungs/Pleura: Lungs are well aerated bilaterally. No focal
infiltrate or sizable effusion is seen. Very minimal dependent
atelectatic changes are seen. No parenchymal nodules are noted.

Upper Abdomen: Visualized upper abdomen no splenomegaly stable in
appearance from the prior exam. No significant lymphadenopathy is
noted.

Musculoskeletal: Degenerative changes of the thoracic spine are
noted. No rib abnormality is noted. Patchy lucencies are noted
throughout the thoracic spine which have progressed in the interval
from [WJ]. These are not hypermetabolic on recent PET-CT.

Review of the MIP images confirms the above findings.
IMPRESSION: No evidence of pulmonary emboli.

Scattered mediastinal and hilar lymph nodes which have increased in
size from the prior PET-CT. This is likely related to the patient's
known CLL.

Dependent atelectatic changes.

Mild splenomegaly.

Patchy lucencies throughout the thoracic spine which have progressed
from [WJ]. These were not shown to be hypermetabolic on recent
PET-CT.

Aortic Atherosclerosis ([WJ]-[WJ]).

## 2021-07-26 IMAGING — CR DG CHEST 2V
2 series · 2 of 2 positions shown · non-contrast
Comparison: PET-CT [DATE]

CLINICAL DATA: Cough, shortness of breath

EXAM:
CHEST - 2 VIEW

[chest lat]
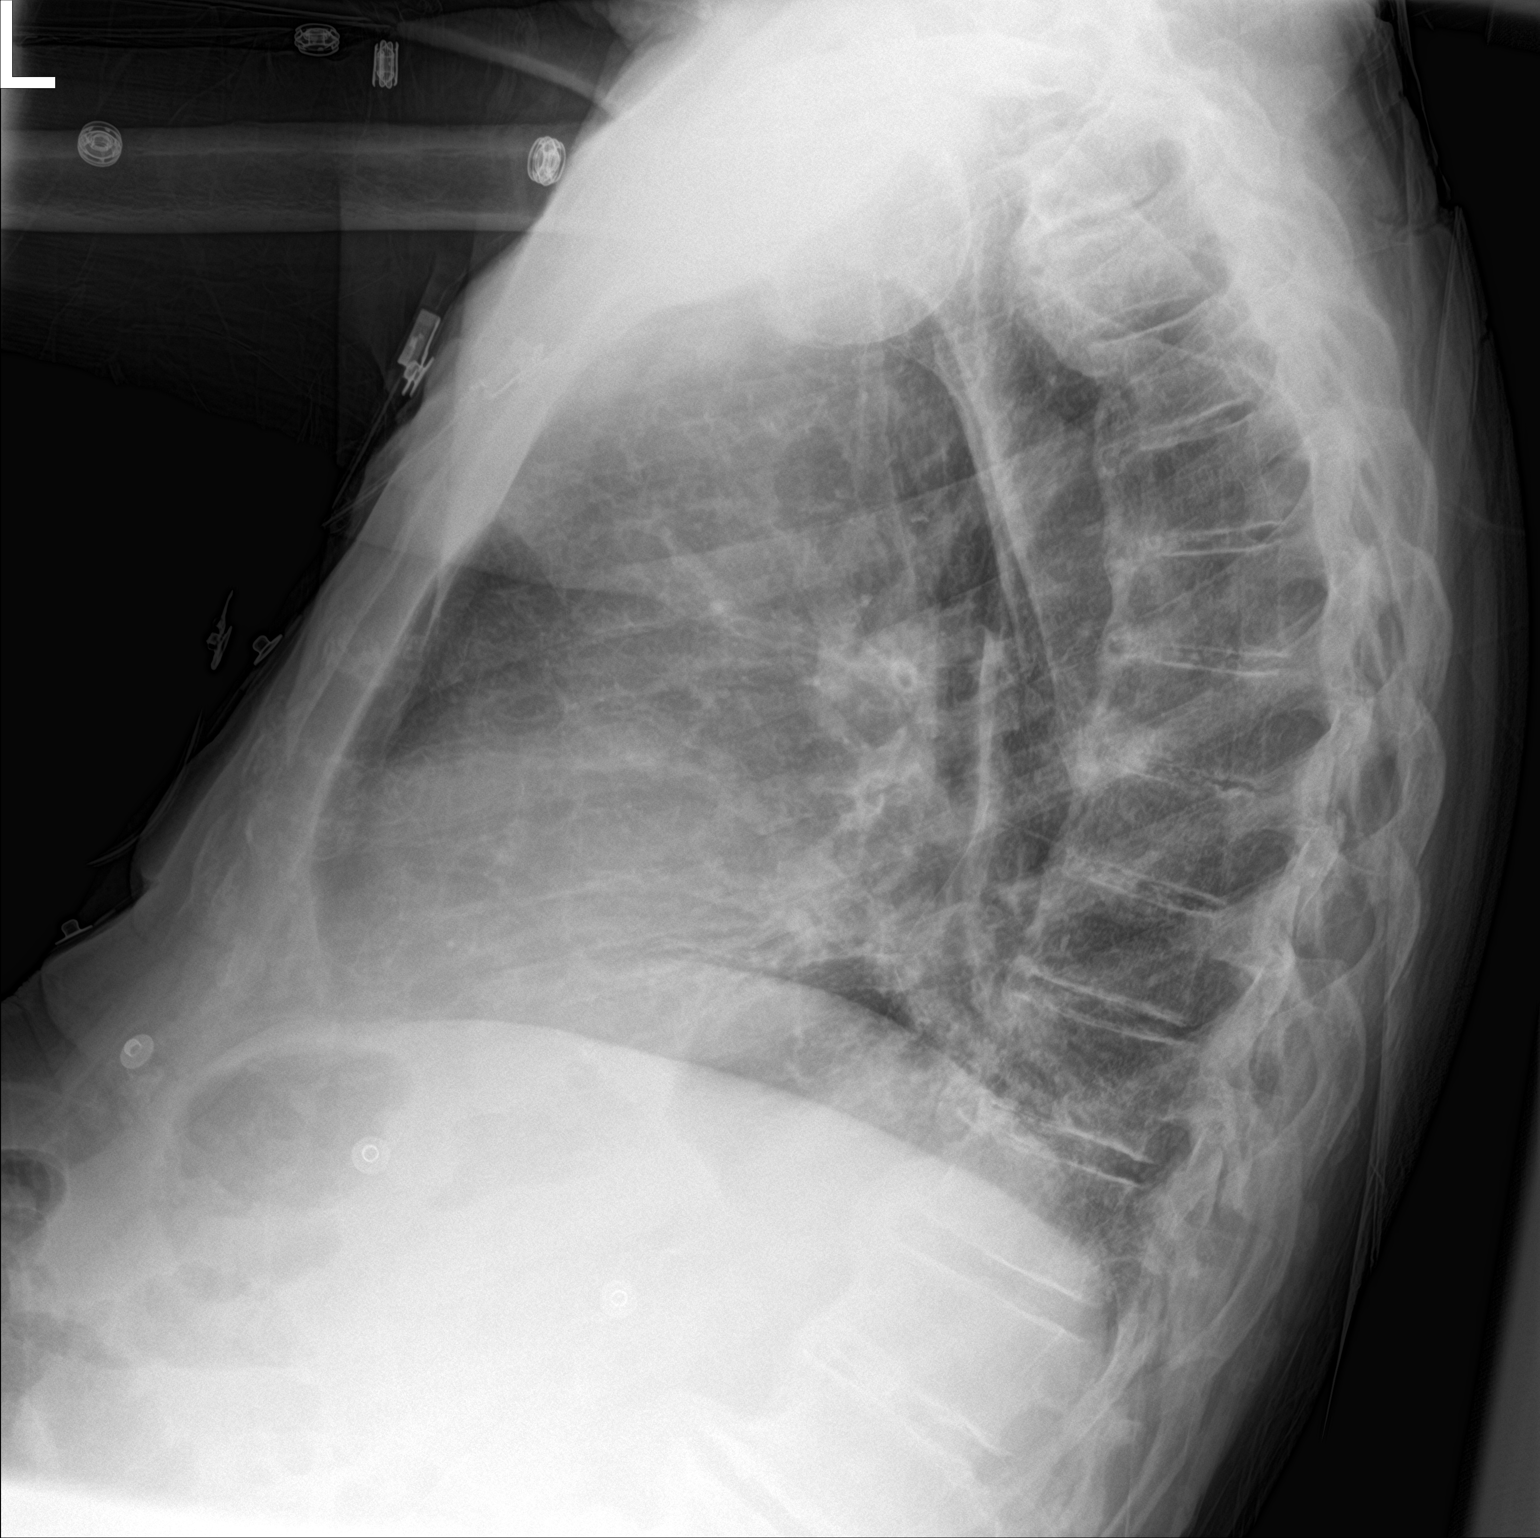

[chest ap]
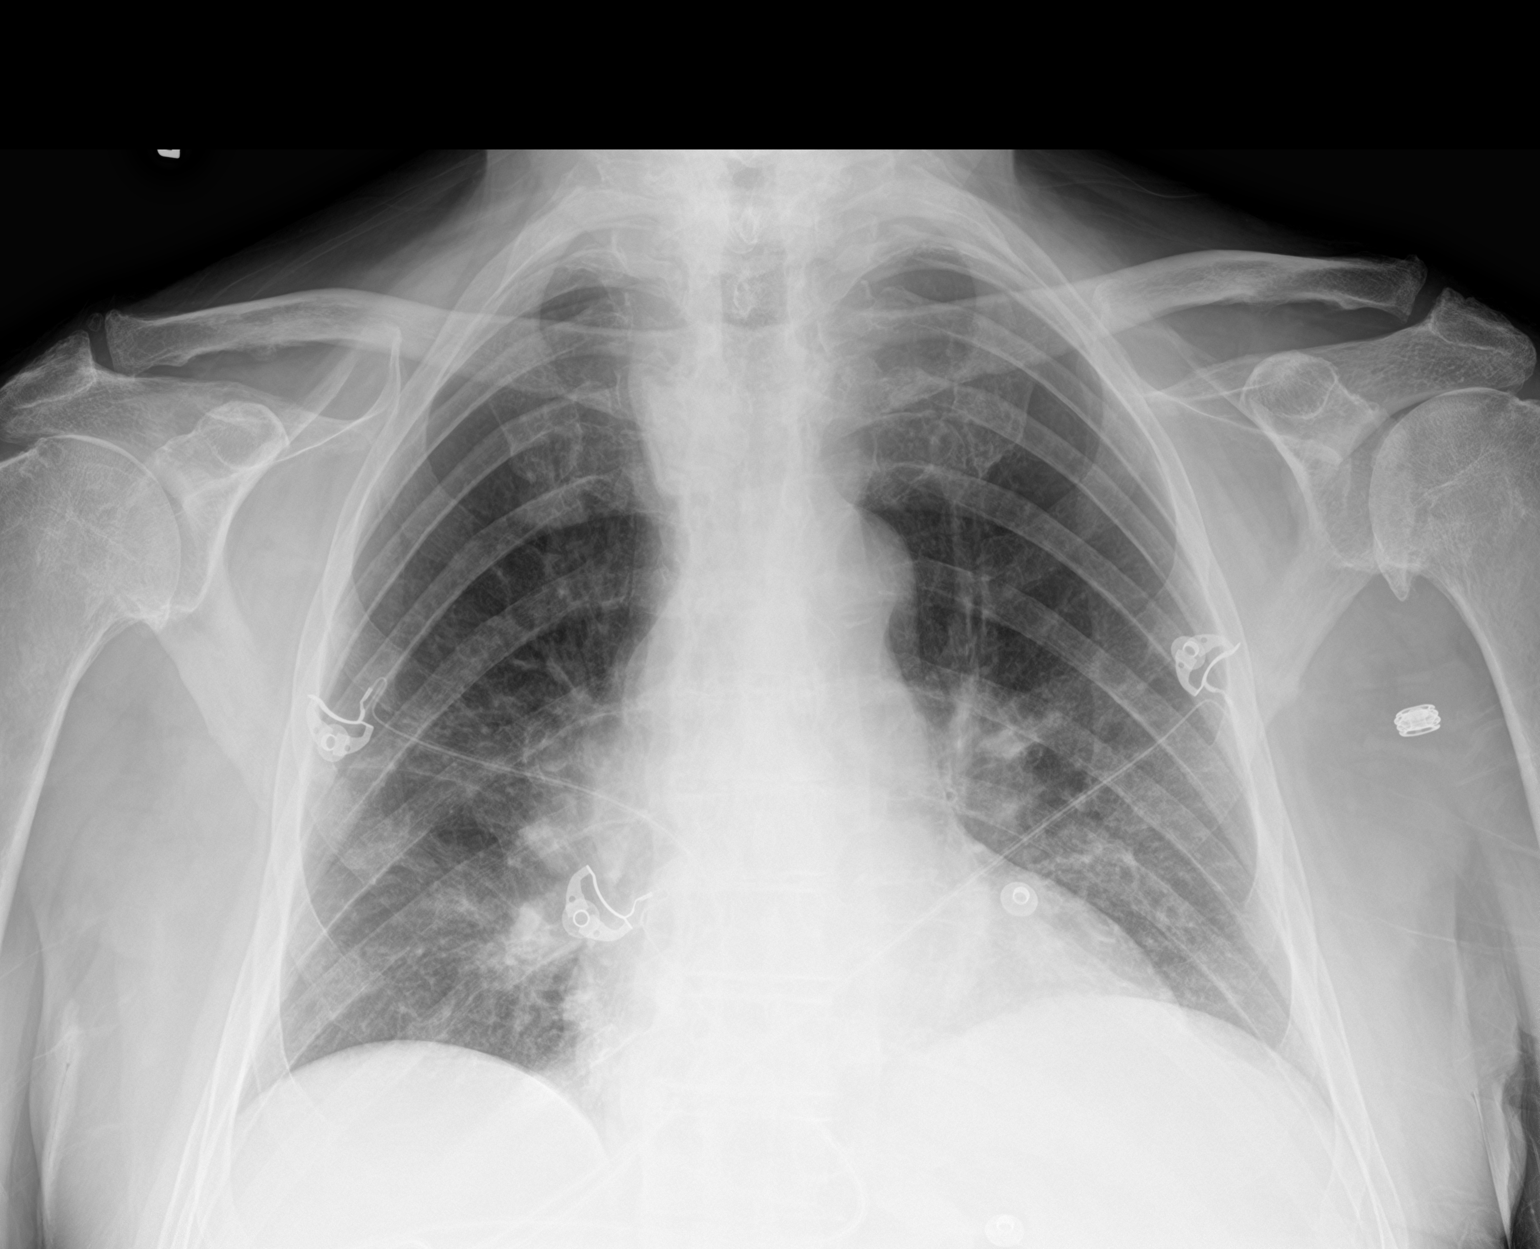

[2 of 2 positions shown; findings below may reference images not displayed]

FINDINGS: Hilar and right paraspinal fullness compatible with adenopathy.

Airway thickening is present, suggesting bronchitis or reactive
airways disease. Suspected mild atelectasis in the left lower lobe.

Thoracic spondylosis.  Degenerative glenohumeral arthropathy.
IMPRESSION: 1. Airway thickening is present, suggesting bronchitis or reactive
airways disease.
2. Suspected mild atelectasis in the left lower lobe.
3. Hilar and paratracheal adenopathy.

## 2021-07-26 MED ORDER — ACETAMINOPHEN 325 MG PO TABS
650.0000 mg | ORAL_TABLET | Freq: Once | ORAL | Status: AC
  Administered 2021-07-26: 650 mg via ORAL
  Filled 2021-07-26: qty 2

## 2021-07-26 MED ORDER — METOPROLOL TARTRATE 50 MG PO TABS: 50.0000 mg | ORAL_TABLET | Freq: Three times a day (TID) | ORAL | 0 refills | Status: DC

## 2021-07-26 MED ORDER — IOHEXOL 350 MG/ML SOLN
75.0000 mL | Freq: Once | INTRAVENOUS | Status: AC | PRN
  Administered 2021-07-26: 75 mL via INTRAVENOUS

## 2021-07-26 MED ORDER — METOPROLOL TARTRATE 25 MG PO TABS
50.0000 mg | ORAL_TABLET | Freq: Once | ORAL | Status: AC
  Administered 2021-07-26: 50 mg via ORAL
  Filled 2021-07-26: qty 2

## 2021-07-26 NOTE — ED Triage Notes (Signed)
Pt here from PCP office d/t irregular heart beat as endorse by medical team there. Patient presents with discoloration to both feet as well as swelling. Patient states that there is minor numbness to feet, but dull sensation. Patient also presents with cough as pt is post COVID. Patient does have cardiac hx and has not been able to take metoprolol for the past two weeks as patient ran out of medication. Patient has no complaints of SOB, states that he feels fine. Pt on RA and not in any visible distress at this time.

## 2021-07-26 NOTE — Progress Notes (Signed)
I was asked to evaluate Thomas Zavala and give recommendations for new onset atrial fibrillation.  I reviewed his EKG and telemetry during his stay here and I cannot discern atrial fibrillation.  He did not provide his EKG from his urgent care office.  He is in normal sinus rhythm with frequent PACs and occasional PVCs.  I discussed this with the family.  He has been off metoprolol and is worn out from recent COVID bout.  I recommend resuming his metoprolol.  I also recommend a 14 day ziopatch to evaluate more exhaustively and will forward this note to his Ms. Cantwell who sees him with [facility].  Delton Prairie, MD Cardiology Fellow [facility]

## 2021-07-26 NOTE — ED Provider Notes (Signed)
[facility] EMERGENCY DEPARTMENT Provider Note   CSN: [id number] Arrival date & time: 07/26/21  1607     History Chief Complaint  Patient presents with   Cough   Irregular Heart Beat   Leg Swelling    Thomas Zavala is a 87 y.o. male.  The history is provided by the patient and medical records.  Weakness Severity:  Moderate Onset quality:  Gradual Duration:  2 weeks Timing:  Constant Progression:  Unchanged Chronicity:  New Context comment:  Recent covid diagnosis 15 days ago Relieved by:  Nothing Worsened by:  Nothing Ineffective treatments:  Rest and drinking fluids Associated symptoms: no abdominal pain, no arthralgias, no dysuria, no seizures and no vomiting       Past Medical History:  Diagnosis Date   Abdominal hernia    BPH (benign prostatic hyperplasia) 07/25/2016   CLL (chronic lymphocytic leukemia) ([place])    Heart murmur    Hypertension    Melanoma of forehead ([place])    Melanoma of scalp ([place])    PVC's (premature ventricular contractions)    Type 2 diabetes mellitus without complication, without long-term current use of insulin ([place]) 07/25/2016    Patient Active Problem List   Diagnosis Date Noted   Changing skin lesion 04/17/2021   Melanoma of scalp ([place]) 01/05/2020   Melanoma of skin ([place]) 12/10/2019   Corneal guttata 10/26/2018   Cortical age-related cataract of both eyes 10/26/2018   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/2018   Nonexudative age-related macular degeneration, bilateral, early dry stage 10/26/2018   Nuclear sclerotic cataract of both eyes 10/26/2018   Asymptomatic PVCs 01/29/2018   Gross hematuria 01/29/2018   CLL (chronic lymphocytic leukemia) ([place]) 07/25/2016   Thrombocytopenia ([place]) 07/25/2016   Splenomegaly 07/25/2016   HTN (hypertension), benign 07/25/2016   Type 2 diabetes mellitus without complication, without long-term current use of insulin ([place]) 07/25/2016   BPH (benign prostatic  hyperplasia) 07/25/2016   Hyperlipidemia 07/25/2016   Lymphocytosis 07/15/2016    Past Surgical History:  Procedure Laterality Date   EYE SURGERY     eye lids   IRRIGATION AND DEBRIDEMENT OF WOUND WITH SPLIT THICKNESS SKIN GRAFT Left 01/05/2020   Procedure: SPLIT THICKNESS SKIN GRAFT Left shoulder;  Surgeon: Wallace Going, DO;  Location: [facility];  Service: Plastics;  Laterality: Left;   MASS EXCISION N/A 01/05/2020   Procedure: EXCISION OF FOREHEAD  MELANOMA;  Surgeon: Wallace Going, DO;  Location: [place];  Service: Plastics;  Laterality: N/A;   MELANOMA EXCISION  02/14/2021   Procedure: MELANOMA EXCISION of scalp;  Surgeon: Wallace Going, DO;  Location: [place];  Service: Plastics;;   PAROTIDECTOMY Left 01/05/2020   Procedure: PAROTIDECTOMY;  Surgeon: Izora Gala, MD;  Location: [place];  Service: ENT;  Laterality: Left;   PROSTATE SURGERY     SENTINEL NODE BIOPSY Left 01/05/2020   Procedure: Sentinel Node Biopsy;  Surgeon: Izora Gala, MD;  Location: [facility] OR;  Service: ENT;  Laterality: Left;       Family History  Problem Relation Age of Onset   Hypertension Father     Social History   Tobacco Use   Smoking status: Former    Packs/day: 1.00    Years: 25.00    Pack years: 25.00    Types: Cigarettes    Quit date: 1970    Years since quitting: 52.9   Smokeless tobacco: Never   Tobacco comments:    quit in the 70's  Vaping Use   Vaping Use: Never used  Substance Use Topics   Alcohol use: Yes    Alcohol/week: 7.0 standard drinks    Types: 7 Standard drinks or equivalent per week    Comment: socially.   Drug use: No    Home Medications Prior to Admission medications   Medication Sig Start Date End Date Taking? Authorizing Provider  acetaminophen (TYLENOL) 500 MG tablet Take 500-1,000 mg by mouth every 6 (six) hours as needed (for pain).   Yes [provider]  carboxymethylcellul-glycerin (REFRESH OPTIVE) 0.5-0.9 % ophthalmic solution Place 1 drop  into both eyes 3 (three) times daily as needed for dry eyes.   Yes [provider]  Dextromethorphan-guaiFENesin (MUCINEX DM PO) Take 1 Dose by mouth at bedtime as needed (cough, sleep).   Yes [provider]  finasteride (PROSCAR) 5 MG tablet Take 5 mg by mouth daily.   Yes [provider]  loratadine (CLARITIN) 10 MG tablet Take 10 mg by mouth daily as needed for allergies or rhinitis.   Yes [provider]  losartan-hydrochlorothiazide (HYZAAR) 100-25 MG tablet Take 1 tablet by mouth daily.  07/05/16  Yes [provider]  metFORMIN (GLUCOPHAGE-XR) 500 MG 24 hr tablet Take 500 mg by mouth daily with breakfast. 07/05/16  Yes [provider]  pravastatin (PRAVACHOL) 40 MG tablet Take 40 mg by mouth daily.  07/05/16  Yes [provider]  hydrALAZINE (APRESOLINE) 25 MG tablet Take 1 tablet (25 mg total) by mouth 3 (three) times daily. Patient not taking: Reported on 07/26/2021 06/15/21 10/13/21  Lawerance Cruel C, PA-C  metoprolol tartrate (LOPRESSOR) 50 MG tablet Take 1 tablet (50 mg total) by mouth 3 (three) times daily. 07/26/21 08/25/21  Idamae Lusher, MD  PFIZER COVID-19 VAC BIVALENT injection  05/07/21   [provider]    Allergies    Tape and Fluorouracil  Review of Systems   Review of Systems  Constitutional:  Positive for fatigue.  Eyes:  Negative for pain and visual disturbance.  Cardiovascular:  Negative for palpitations.  Gastrointestinal:  Negative for abdominal pain and vomiting.  Genitourinary:  Negative for dysuria and hematuria.  Musculoskeletal:  Negative for arthralgias and back pain.  Skin:  Negative for color change.  Neurological:  Positive for weakness. Negative for seizures and syncope.  All other systems reviewed and are negative.  Physical Exam Updated Vital Signs BP 116/72   Pulse 82   Temp 97.6 F (36.4 C) (Oral)   Resp 16   SpO2 97%   Physical Exam Vitals and nursing note reviewed.   Constitutional:      General: He is not in acute distress.    Appearance: Normal appearance. He is well-developed.  HENT:     Head: Normocephalic and atraumatic.     Right Ear: External ear normal.     Left Ear: External ear normal.     Nose: Nose normal. No congestion.     Mouth/Throat:     Mouth: Mucous membranes are moist.     Pharynx: Oropharynx is clear. No posterior oropharyngeal erythema.  Eyes:     Extraocular Movements: Extraocular movements intact.     Conjunctiva/sclera: Conjunctivae normal.     Pupils: Pupils are equal, round, and reactive to light.  Cardiovascular:     Rate and Rhythm: Normal rate. Rhythm irregular.     Pulses: Normal pulses.     Heart sounds: No murmur heard. Pulmonary:     Effort: Pulmonary effort is normal. No  respiratory distress.     Breath sounds: Normal breath sounds. No wheezing, rhonchi or rales.  Abdominal:     General: Abdomen is flat. Bowel sounds are normal.     Palpations: Abdomen is soft.     Tenderness: There is no abdominal tenderness. There is no guarding or rebound.  Musculoskeletal:        General: No swelling, tenderness or deformity. Normal range of motion.     Cervical back: Normal range of motion and neck supple. No rigidity.     Right lower leg: Edema (1+ pedal) present.     Left lower leg: Edema (1+ pedal) present.  Skin:    General: Skin is warm and dry.     Capillary Refill: Capillary refill takes less than 2 seconds.     Findings: No rash.  Neurological:     General: No focal deficit present.     Mental Status: He is alert and oriented to person, place, and time.     Cranial Nerves: No cranial nerve deficit.     Sensory: No sensory deficit.     Motor: Weakness (symmetric, generalized) present.     Coordination: Coordination normal.  Psychiatric:        Mood and Affect: Mood normal.    ED Results / Procedures / Treatments   Labs (all labs ordered are listed, but only abnormal results are displayed) Labs  Reviewed  RESP PANEL BY RT-PCR (FLU A&B, COVID) ARPGX2 - Abnormal; Notable for the following components:      Result Value   SARS Coronavirus 2 by RT PCR POSITIVE (*)    All other components within normal limits  BASIC METABOLIC PANEL - Abnormal; Notable for the following components:   Sodium 128 (*)    Chloride 95 (*)    Glucose, Bld 171 (*)    Creatinine, Ser 1.29 (*)    Calcium 8.7 (*)    GFR, Estimated 52 (*)    All other components within normal limits  CBC - Abnormal; Notable for the following components:   WBC 51.3 (*)    RBC 3.85 (*)    Hemoglobin 12.7 (*)    HCT 37.8 (*)    All other components within normal limits  LACTIC ACID, PLASMA  MAGNESIUM  TSH  BRAIN NATRIURETIC PEPTIDE  TROPONIN I (HIGH SENSITIVITY)  TROPONIN I (HIGH SENSITIVITY)    EKG EKG Interpretation  Date/Time:  Thursday July 26 2021 16:42:36 EST Ventricular Rate:  102 PR Interval:  200 QRS Duration: 72 QT Interval:  336 QTC Calculation: 437 R Axis:   41 Text Interpretation: Sinus tachycardia with Premature atrial complexes possible period of afib Otherwise normal ECG Confirmed by Carmin Muskrat [phone]) on 07/26/2021 6:27:40 PM  Radiology DG Chest 2 View  Result Date: 07/26/2021 CLINICAL DATA:  Cough, shortness of breath EXAM: CHEST - 2 VIEW COMPARISON:  PET-CT 02/28/2021 FINDINGS: Hilar and right paraspinal fullness compatible with adenopathy. Airway thickening is present, suggesting bronchitis or reactive airways disease. Suspected mild atelectasis in the left lower lobe. Thoracic spondylosis.  Degenerative glenohumeral arthropathy. IMPRESSION: 1. Airway thickening is present, suggesting bronchitis or reactive airways disease. 2. Suspected mild atelectasis in the left lower lobe. 3. Hilar and paratracheal adenopathy. Electronically Signed   By: Van Clines M.D.   On: 07/26/2021 19:43   CT Angio Chest PE W and/or Wo Contrast  Result Date: 07/26/2021 CLINICAL DATA:  Tachycardia, history  of prior COVID infection, initial encounter EXAM: CT ANGIOGRAPHY CHEST WITH CONTRAST TECHNIQUE: Multidetector  CT imaging of the chest was performed using the standard protocol during bolus administration of intravenous contrast. Multiplanar CT image reconstructions and MIPs were obtained to evaluate the vascular anatomy. CONTRAST:  75mL OMNIPAQUE IOHEXOL 350 MG/ML SOLN COMPARISON:  Chest x-ray from earlier in the same day, PET-CT from 02/28/2021, CT from 10/10/2017. FINDINGS: Cardiovascular: Thoracic aorta and its branches demonstrate atherosclerotic calcification. No aneurysmal dilatation or dissection is noted. The pulmonary artery shows a normal branching pattern bilaterally. No filling defects are identified to suggest pulmonary embolism. Coronary calcifications are noted. Mediastinum/Nodes: Thoracic inlet is within normal limits. The esophagus is within normal limits. Scattered lymphadenopathy is noted within the mediastinum increased from the prior exam. A 14 mm short axis right paratracheal lymph node is seen. 11 mm short axis AP window node is noted. 16 mm short axis subcarinal node is seen. Bilateral symmetrical hilar adenopathy is noted as well. These measure up to 16 mm in the right hilum and 11 mm in the left hilum. These are increased from the prior exam as well. Lungs/Pleura: Lungs are well aerated bilaterally. No focal infiltrate or sizable effusion is seen. Very minimal dependent atelectatic changes are seen. No parenchymal nodules are noted. Upper Abdomen: Visualized upper abdomen no splenomegaly stable in appearance from the prior exam. No significant lymphadenopathy is noted. Musculoskeletal: Degenerative changes of the thoracic spine are noted. No rib abnormality is noted. Patchy lucencies are noted throughout the thoracic spine which have progressed in the interval from 2019. These are not hypermetabolic on recent PET-CT. Review of the MIP images confirms the above findings. IMPRESSION: No  evidence of pulmonary emboli. Scattered mediastinal and hilar lymph nodes which have increased in size from the prior PET-CT. This is likely related to the patient's known CLL. Dependent atelectatic changes. Mild splenomegaly. Patchy lucencies throughout the thoracic spine which have progressed from 2019. These were not shown to be hypermetabolic on recent PET-CT. Aortic Atherosclerosis (ICD10-I70.0). Electronically Signed   By: Inez Catalina M.D.   On: 07/26/2021 19:56    Procedures Procedures   Medications Ordered in ED Medications  acetaminophen (TYLENOL) tablet 650 mg (650 mg Oral Given 07/26/21 1816)  iohexol (OMNIPAQUE) 350 MG/ML injection 75 mL (75 mLs Intravenous Contrast Given 07/26/21 1938)  metoprolol tartrate (LOPRESSOR) tablet 50 mg (50 mg Oral Given 07/26/21 2200)    ED Course  I have reviewed the triage vital signs and the nursing notes.  Pertinent labs & imaging results that were available during my care of the patient were reviewed by me and considered in my medical decision making (see chart for details).    MDM Rules/Calculators/A&P                          This is a 87 year old gentleman with history of CLL, hypertension, type 2 diabetes presenting with concerns abnormal heart rhythm.  He is currently recovering from [place], which was diagnosed 15 days ago.  He has experienced persistent cough and fatigue.  He was following with his PCP earlier today when he was thought to be in A. fib.  This would be a new diagnosis for him.  He was sent to the ED for further evaluation.  He was previously on metoprolol for frequent PVCs, but ran out of this 2 weeks ago and has not resumed it.  He denies chest pain, pressure, palpitations.  He is afebrile and hemodynamically stable on arrival.  EKG showed irregular rhythm with intermittent P waves.  He  is currently rate controlled.  There are no acute ischemic changes.  He has no current complaints.  Basic labs and CT chest obtained.  Labs  reviewed.  Electrolytes reassuring.  Mag normal.  TSH normal.  BNP <100.  Trace pedal edema on exam.  Troponin is normal x2.  Low concern for ACS or CHF exacerbation.  White count significantly elevated, but at baseline for hx of CLL.  Mildly febrile here, but chest x-ray showed no acute infiltrate.  CT chest showed no signs of PE. Discussed with cardiology team.  Patient was evaluated at bedside.  EKG and telemetry reviewed.  Heart rhythm appears to be sinus arrhythmia with PACs.  Less concern for new onset A. fib.  He does not require anticoagulation.  He is appropriate for discharge from their perspective with close follow-up with cardiology in clinic and Zio patch monitoring.  Patient to restart home dose metoprolol as previously prescribed.  All findings discussed with patient and family.  He is appropriate for discharge home with follow-up as discussed.  Strict return to ED precautions provided.  Final Clinical Impression(s) / ED Diagnoses Final diagnoses:  Other fatigue  Sinus arrhythmia    Rx / DC Orders ED Discharge Orders          Ordered    metoprolol tartrate (LOPRESSOR) 50 MG tablet  3 times daily        07/26/21 2146             Idamae Lusher, MD 07/26/21 7290    Carmin Muskrat, MD 07/27/21 [id number]

## 2021-07-26 NOTE — ED Provider Notes (Signed)
Emergency Medicine Provider Triage Evaluation Note  Thomas Zavala , a 87 y.o. male  was evaluated in triage.  Pt complains of cough and shortness of breath.  Was seen and evaluated by his primary care doctor today and sent here for paroxysmal atrial fibrillation.  Does not have a formal diagnosis of A. fib.  Was diagnosed with COVID 15 days ago.  No nausea, vomiting, diarrhea.  Review of Systems  Positive:  Negative: See above   Physical Exam  BP (!) 142/75 (BP Location: Right Arm)   Pulse 95   Temp (!) 100.7 F (38.2 C)   Resp 15   SpO2 95%  Gen:   Awake, no distress   Resp:  Normal effort  MSK:   Moves extremities without difficulty  Other:  Irregular rhythm.  Normal rate.  No obvious murmurs heard.  Medical Decision Making  Medically screening exam initiated at 5:17 PM.  Appropriate orders placed.  Thomas Zavala was informed that the remainder of the evaluation will be completed by another provider, this initial triage assessment does not replace that evaluation, and the importance of remaining in the ED until their evaluation is complete.     Thomas Bright [place], PA-C 07/26/21 1719    [name], MD 07/27/21 [phone]

## 2021-07-26 NOTE — Discharge Instructions (Addendum)
Please resume your metoprolol 50 mg 3 times daily.  Please follow-up with your cardiologist early next week for reassessment and to discuss Zio patch monitoring.  Return to the ED with any worsening palpitations, dizziness, shortness of breath, chest pain.

## 2021-07-26 NOTE — ED Notes (Signed)
Pt just called doctor to verify why he was told to come to ED. Pt informed this tech that pt is having a new set of Afib RVR

## 2021-08-01 ENCOUNTER — Ambulatory Visit: Payer: PPO | Admitting: Hematology

## 2021-08-01 ENCOUNTER — Other Ambulatory Visit: Payer: PPO

## 2021-08-28 ENCOUNTER — Other Ambulatory Visit: Payer: Self-pay

## 2021-08-28 ENCOUNTER — Inpatient Hospital Stay: Payer: PPO

## 2021-08-28 ENCOUNTER — Ambulatory Visit: Payer: PPO | Admitting: Student

## 2021-08-28 ENCOUNTER — Encounter: Payer: Self-pay | Admitting: Student

## 2021-08-28 VITALS — BP 121/69 | HR 71 | Temp 97.8°F | Ht 71.0 in | Wt 162.0 lb

## 2021-08-28 DIAGNOSIS — I491 Atrial premature depolarization: Secondary | ICD-10-CM

## 2021-08-28 DIAGNOSIS — R002 Palpitations: Secondary | ICD-10-CM

## 2021-08-28 DIAGNOSIS — I493 Ventricular premature depolarization: Secondary | ICD-10-CM

## 2021-08-28 DIAGNOSIS — I10 Essential (primary) hypertension: Secondary | ICD-10-CM

## 2021-08-28 MED ORDER — METOPROLOL TARTRATE 50 MG PO TABS: 50.0000 mg | ORAL_TABLET | Freq: Three times a day (TID) | ORAL | 3 refills | Status: DC
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
## 2021-09-07 ENCOUNTER — Ambulatory Visit: Payer: PPO | Admitting: Podiatry

## 2021-09-10 ENCOUNTER — Other Ambulatory Visit: Payer: Self-pay | Admitting: Student

## 2021-09-12 ENCOUNTER — Encounter: Payer: Self-pay | Admitting: Podiatry

## 2021-09-12 ENCOUNTER — Ambulatory Visit (INDEPENDENT_AMBULATORY_CARE_PROVIDER_SITE_OTHER): Payer: PPO | Admitting: Podiatry

## 2021-09-12 ENCOUNTER — Other Ambulatory Visit: Payer: Self-pay

## 2021-09-12 DIAGNOSIS — E119 Type 2 diabetes mellitus without complications: Secondary | ICD-10-CM

## 2021-09-12 DIAGNOSIS — B351 Tinea unguium: Secondary | ICD-10-CM

## 2021-09-12 DIAGNOSIS — M79674 Pain in right toe(s): Secondary | ICD-10-CM | POA: Diagnosis not present

## 2021-09-12 DIAGNOSIS — M79675 Pain in left toe(s): Secondary | ICD-10-CM | POA: Diagnosis not present
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
## 2021-09-18 ENCOUNTER — Encounter: Payer: Self-pay | Admitting: Podiatry

## 2021-09-19 ENCOUNTER — Telehealth: Payer: Self-pay

## 2021-09-19 NOTE — Telephone Encounter (Signed)
14 days  173 episodes of complete heart block. Lowest heart beat 31 bmp, for 22.5 seconds Pages 16-17 strip #8 2 episodes of vtach 464 episodes of svt Patient was asymptomatic

## 2021-09-19 NOTE — Telephone Encounter (Signed)
I have LVM for patient to call the office. Will you please follow up with him first thing tomorrow morning. He needs to stop Lopressor and I need to talk to him about medication changes and monitor results.

## 2021-09-20 MED ORDER — PINDOLOL 5 MG PO TABS: 5.0000 mg | ORAL_TABLET | Freq: Two times a day (BID) | ORAL | 3 refills | Status: DC

## 2021-09-20 NOTE — Telephone Encounter (Signed)
Discussed stopping lopressor and switching to pindolol. Patient and his daughter are agreeable.

## 2021-09-21 NOTE — Progress Notes (Signed)
Pt called back and was transferred to [facility]

## 2021-10-09 ENCOUNTER — Other Ambulatory Visit: Payer: Self-pay | Admitting: Student

## 2021-10-25 DIAGNOSIS — H9113 Presbycusis, bilateral: Secondary | ICD-10-CM | POA: Insufficient documentation

## 2021-11-14 ENCOUNTER — Encounter: Payer: Self-pay | Admitting: Podiatry

## 2021-11-14 ENCOUNTER — Ambulatory Visit (INDEPENDENT_AMBULATORY_CARE_PROVIDER_SITE_OTHER): Payer: PPO | Admitting: Podiatry

## 2021-11-14 DIAGNOSIS — B351 Tinea unguium: Secondary | ICD-10-CM

## 2021-11-14 DIAGNOSIS — E119 Type 2 diabetes mellitus without complications: Secondary | ICD-10-CM | POA: Diagnosis not present

## 2021-11-14 DIAGNOSIS — M79675 Pain in left toe(s): Secondary | ICD-10-CM

## 2021-11-14 DIAGNOSIS — M79674 Pain in right toe(s): Secondary | ICD-10-CM
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
## 2021-12-13 ENCOUNTER — Inpatient Hospital Stay (HOSPITAL_BASED_OUTPATIENT_CLINIC_OR_DEPARTMENT_OTHER): Payer: PPO | Admitting: Hematology

## 2021-12-13 ENCOUNTER — Inpatient Hospital Stay: Payer: PPO | Attending: Hematology

## 2021-12-13 ENCOUNTER — Other Ambulatory Visit: Payer: Self-pay

## 2021-12-13 VITALS — BP 124/58 | HR 70 | Temp 97.9°F | Resp 18 | Wt 172.7 lb

## 2021-12-13 DIAGNOSIS — C9110 Chronic lymphocytic leukemia of B-cell type not having achieved remission: Secondary | ICD-10-CM | POA: Insufficient documentation

## 2021-12-13 DIAGNOSIS — C439 Malignant melanoma of skin, unspecified: Secondary | ICD-10-CM | POA: Diagnosis not present

## 2021-12-13 DIAGNOSIS — C4339 Malignant melanoma of other parts of face: Secondary | ICD-10-CM | POA: Insufficient documentation

## 2021-12-13 DIAGNOSIS — C4330 Malignant melanoma of unspecified part of face: Secondary | ICD-10-CM

## 2021-12-13 DIAGNOSIS — C434 Malignant melanoma of scalp and neck: Secondary | ICD-10-CM | POA: Insufficient documentation

## 2021-12-13 DIAGNOSIS — Z87891 Personal history of nicotine dependence: Secondary | ICD-10-CM | POA: Insufficient documentation

## 2021-12-13 LAB — CBC WITH DIFFERENTIAL (CANCER CENTER ONLY)
Abs Immature Granulocytes: 0.15 10*3/uL — ABNORMAL HIGH (ref 0.00–0.07)
Basophils Absolute: 0.1 10*3/uL (ref 0.0–0.1)
Basophils Relative: 0 %
Eosinophils Absolute: 0.1 10*3/uL (ref 0.0–0.5)
Eosinophils Relative: 0 %
HCT: 33.8 % — ABNORMAL LOW (ref 39.0–52.0)
Hemoglobin: 10.9 g/dL — ABNORMAL LOW (ref 13.0–17.0)
Immature Granulocytes: 0 %
Lymphocytes Relative: 93 %
Lymphs Abs: 93 10*3/uL — ABNORMAL HIGH (ref 0.7–4.0)
MCH: 31.7 pg (ref 26.0–34.0)
MCHC: 32.2 g/dL (ref 30.0–36.0)
MCV: 98.3 fL (ref 80.0–100.0)
Monocytes Absolute: 4.4 10*3/uL — ABNORMAL HIGH (ref 0.1–1.0)
Monocytes Relative: 4 %
Neutro Abs: 3.4 10*3/uL (ref 1.7–7.7)
Neutrophils Relative %: 3 %
Platelet Count: 118 10*3/uL — ABNORMAL LOW (ref 150–400)
RBC: 3.44 MIL/uL — ABNORMAL LOW (ref 4.22–5.81)
RDW: 14.7 % (ref 11.5–15.5)
Smear Review: NORMAL
WBC Count: 101 10*3/uL (ref 4.0–10.5)
nRBC: 0 % (ref 0.0–0.2)

## 2021-12-13 LAB — CMP (CANCER CENTER ONLY)
ALT: 10 U/L (ref 0–44)
AST: 18 U/L (ref 15–41)
Albumin: 3.9 g/dL (ref 3.5–5.0)
Alkaline Phosphatase: 128 U/L — ABNORMAL HIGH (ref 38–126)
Anion gap: 5 (ref 5–15)
BUN: 16 mg/dL (ref 8–23)
CO2: 30 mmol/L (ref 22–32)
Calcium: 9 mg/dL (ref 8.9–10.3)
Chloride: 95 mmol/L — ABNORMAL LOW (ref 98–111)
Creatinine: 0.97 mg/dL (ref 0.61–1.24)
GFR, Estimated: >60 mL/min (ref >60)
Glucose, Bld: 143 mg/dL — ABNORMAL HIGH (ref 70–99)
Potassium: 3.7 mmol/L (ref 3.5–5.1)
Sodium: 130 mmol/L — ABNORMAL LOW (ref 135–145)
Total Bilirubin: 0.8 mg/dL (ref 0.3–1.2)
Total Protein: 6.1 g/dL — ABNORMAL LOW (ref 6.5–8.1)

## 2021-12-13 LAB — SAMPLE TO BLOOD BANK

## 2021-12-13 LAB — LACTATE DEHYDROGENASE: LDH: 168 U/L (ref 98–192)

## 2021-12-13 NOTE — Progress Notes (Signed)
CRITICAL VALUE STICKER ? ?CRITICAL VALUE: WBC 101 ? ?DATE & TIME NOTIFIED: 12/13/21 10:18 ? ?MD NOTIFIED: Dr Irene Limbo ? ?TIME OF NOTIFICATION:12/13/21 10:20 ? ?

## 2021-12-13 NOTE — Progress Notes (Signed)
? ? ?HEMATOLOGY/ONCOLOGY CLINIC NOTE ? ?Date of Service: 12/13/2021 ? ?Patient Care Team: ?Corrington, Delsa Grana, MD as PCP - General (Family Medicine) ? ?CHIEF COMPLAINTS/PURPOSE OF CONSULTATION:  ?Continued evaluation and management of Chronic Lymphocytic Leukemia ?Recently diagnosed Melanoma ? ?HISTORY OF PRESENTING ILLNESS:  ?Please see previous note for details on initial presentation ? ?INTERVAL HISTORY:  ?Thomas Zavala is a  88 y.o. male returns today for continued management and evaluation of his CLL and his melanoma. The patient's last visit with [place] was on 01/16/2021. He reports He is doing well with no new symptoms or concerns. We are joined today by his daughter. ? ?Since our last visit he saw Dr. Marla Roe and had another melanoma lesion resected from the top of his head. ? ?He presents today with visible skin lesions and nodules on his scalp which Dr. Marla Roe confirmed as recurrent melanoma. We discussed that due the satellite lesions that we should consider systemic therapy. We further discussed getting scans and MRI of brain and potential treatment options. His daughter asked for clarification regarding why we might withhold treatment and we discussed the reasons would be if he does not tolerate treatment, we see improvement such as tumors receeding with no evidence of disease clinically for 2 years, he decides to stop, and lastly if it is getting worse with signs of progression despite treatment. He has decided to think on it and make a decision later and call regarding treatment plan. ? ?He reports that he has lost some weight in the last year.  ? ?His daughter notes a recent COVID infection back in November of 2022. He notes he did not require a hospital visit. He further notes that all symptoms have resolved completely since. ? ?He reports some recent night sweats that occurred once but have not happened since. ? ?He notes he is eating and drinking well. ? ?No fever, chills, night sweats. ?No  rashes. ?No abdominal pain or change in bowel habits. ?No new or unexpected weight loss. ?No SOB or chest pain. ?No other new or acute focal symptoms. ? ?We reviewed labs done today in detail. ?CBC shows significantly elevated WBC count of 101 K/uL, developing mild anemia with RBC count of 3.44, Hgb of 10.9, and HCT of 33.8%, mild thrombocytopenia with platelets of 118K/uL. ?CMP stable. ?LDH pending. ? ?MEDICAL HISTORY:  ?Past Medical History:  ?Diagnosis Date  ? Abdominal hernia   ? BPH (benign prostatic hyperplasia) 07/25/2016  ? CLL (chronic lymphocytic leukemia) ([place])   ? Heart murmur   ? Hypertension   ? Melanoma of forehead ([place])   ? Melanoma of scalp ([place])   ? PVC's (premature ventricular contractions)   ? Type 2 diabetes mellitus without complication, without long-term current use of insulin ([place]) 07/25/2016  ? ? ?SURGICAL HISTORY: ?Past Surgical History:  ?Procedure Laterality Date  ? EYE SURGERY    ? eye lids  ? IRRIGATION AND DEBRIDEMENT OF WOUND WITH SPLIT THICKNESS SKIN GRAFT Left 01/05/2020  ? Procedure: SPLIT THICKNESS SKIN GRAFT Left shoulder;  Surgeon: Wallace Going, DO;  Location: [facility];  Service: Plastics;  Laterality: Left;  ? MASS EXCISION N/A 01/05/2020  ? Procedure: EXCISION OF FOREHEAD  MELANOMA;  Surgeon: Wallace Going, DO;  Location: [facility];  Service: Plastics;  Laterality: N/A;  ? MELANOMA EXCISION  02/14/2021  ? Procedure: MELANOMA EXCISION of scalp;  Surgeon: Wallace Going, DO;  Location: [facility];  Service: Plastics;;  ? PAROTIDECTOMY Left 01/05/2020  ? Procedure: PAROTIDECTOMY;  Surgeon: Izora Gala, MD;  Location: [facility];  Service: ENT;  Laterality: Left;  ? PROSTATE SURGERY    ? SENTINEL NODE BIOPSY Left 01/05/2020  ? Procedure: Sentinel Node Biopsy;  Surgeon: Izora Gala, MD;  Location: [facility];  Service: ENT;  Laterality: Left;  ? ? ?SOCIAL HISTORY: ?Social History  ? ?Socioeconomic History  ? Marital status: Widowed  ?  Spouse name: Not on file  ? Number of children: 3   ? Years of education: Not on file  ? Highest education level: Not on file  ?Occupational History  ? Not on file  ?Tobacco Use  ? Smoking status: Former  ?  Packs/day: 1.00  ?  Years: 25.00  ?  Pack years: 25.00  ?  Types: Cigarettes  ?  Quit date: 1970  ?  Years since quitting: 53.3  ? Smokeless tobacco: Never  ? Tobacco comments:  ?  quit in the 70's  ?Vaping Use  ? Vaping Use: Never used  ?Substance and Sexual Activity  ? Alcohol use: Yes  ?  Alcohol/week: 7.0 standard drinks  ?  Types: 7 Standard drinks or equivalent per week  ?  Comment: socially.  ? Drug use: No  ? Sexual activity: Not on file  ?Other Topics Concern  ? Not on file  ?Social History Narrative  ? Not on file  ? ?Social Determinants of Health  ? ?Financial Resource Strain: Not on file  ?Food Insecurity: Not on file  ?Transportation Needs: Not on file  ?Physical Activity: Not on file  ?Stress: Not on file  ?Social Connections: Not on file  ?Intimate Partner Violence: Not on file  ? ? ?FAMILY HISTORY: ?Family History  ?Problem Relation Age of Onset  ? Hypertension Father   ? ? ?ALLERGIES:  is allergic to tape and fluorouracil. ? ?MEDICATIONS:  ?Current Outpatient Medications  ?Medication Sig Dispense Refill  ? acetaminophen (TYLENOL) 500 MG tablet Take 500-1,000 mg by mouth every 6 (six) hours as needed (for pain).    ? carboxymethylcellul-glycerin (REFRESH OPTIVE) 0.5-0.9 % ophthalmic solution Place 1 drop into both eyes 3 (three) times daily as needed for dry eyes.    ? Dextromethorphan-guaiFENesin (MUCINEX DM PO) Take 1 Dose by mouth at bedtime as needed (cough, sleep).    ? finasteride (PROSCAR) 5 MG tablet Take 5 mg by mouth daily.    ? hydrALAZINE (APRESOLINE) 25 MG tablet TAKE 1 TABLET BY MOUTH THREE TIMES A DAY 270 tablet 1  ? loratadine (CLARITIN) 10 MG tablet Take 10 mg by mouth daily as needed for allergies or rhinitis.    ? losartan-hydrochlorothiazide (HYZAAR) 100-25 MG tablet Take 1 tablet by mouth daily.     ? metFORMIN  (GLUCOPHAGE-XR) 500 MG 24 hr tablet Take 500 mg by mouth daily with breakfast.    ? PFIZER COVID-19 VAC BIVALENT injection     ? pindolol (VISKEN) 5 MG tablet TAKE 1 TABLET BY MOUTH TWICE A DAY 180 tablet 1  ? pravastatin (PRAVACHOL) 40 MG tablet Take 40 mg by mouth daily.     ? ?No current facility-administered medications for this visit.  ? ? ?REVIEW OF SYSTEMS:   ?.10 Point review of Systems was done is negative except as noted above. ? ? ?PHYSICAL EXAMINATION: ?ECOG PERFORMANCE STATUS: 2 - Symptomatic, <50% confined to bed ? ?. ?Vitals:  ? 12/13/21 1010  ?BP: (!) 124/58  ?Pulse: 70  ?Resp: 18  ?Temp: 97.9 ?F (36.6 ?C)  ?SpO2: 98%  ? ?Filed Weights  ?  12/13/21 1010  ?Weight: 172 lb 11.2 oz (78.3 kg)  ? ?Body mass index is 24.09 kg/m?.  ? ?NAD ?GENERAL:alert, in no acute distress and comfortable ?SKIN: no acute rashes, no significant lesions ?EYES: conjunctiva are pink and non-injected, sclera anicteric ?NECK: supple, no JVD ?LYMPH:  palpable lymphadenopathy in the cervical region to the left upper anterior and right mid posterior.  ?LUNGS: clear to auscultation b/l with normal respiratory effort ?HEART: regular rate & rhythm ?ABDOMEN:  normoactive bowel sounds , non tender, not distended. ?Extremity: no pedal edema ?PSYCH: alert & oriented x 3 with fluent speech ?NEURO: no focal motor/sensory deficits ? ?LABORATORY DATA:  ?I have reviewed the data as listed ? ?. ? ?  Latest Ref Rng & Units 12/13/2021  ?  9:49 AM 07/26/2021  ?  5:16 PM 04/16/2021  ?  1:48 PM  ?CBC  ?WBC 4.0 - 10.5 K/uL 101.0   51.3   81.2    ?Hemoglobin 13.0 - 17.0 g/dL 10.9   12.7   14.2    ?Hematocrit 39.0 - 52.0 % 33.8   37.8   43.5    ?Platelets 150 - 400 K/uL 118   154   122    ? ? ?. ? ?  Latest Ref Rng & Units 07/26/2021  ?  5:16 PM 04/16/2021  ?  1:48 PM 04/12/2021  ?  1:11 PM  ?CMP  ?Glucose 70 - 99 mg/dL 171   241   144    ?BUN 8 - 23 mg/dL '20   19   17    '$ ?Creatinine 0.61 - 1.24 mg/dL 1.29   1.08   1.15    ?Sodium 135 - 145 mmol/L 128   128    132    ?Potassium 3.5 - 5.1 mmol/L 4.4   5.4   3.3    ?Chloride 98 - 111 mmol/L 95   90   95    ?CO2 22 - 32 mmol/L '22   28   29    '$ ?Calcium 8.9 - 10.3 mg/dL 8.7   9.2   9.0    ?Total Protein 6.5 - 8.1 g/dL   6.1    ?To

## 2021-12-14 ENCOUNTER — Encounter: Payer: Self-pay | Admitting: Hematology

## 2021-12-14 ENCOUNTER — Telehealth: Payer: Self-pay | Admitting: Hematology

## 2021-12-14 DIAGNOSIS — C439 Malignant melanoma of skin, unspecified: Secondary | ICD-10-CM | POA: Insufficient documentation

## 2021-12-14 MED ORDER — ONDANSETRON HCL 8 MG PO TABS: 8.0000 mg | ORAL_TABLET | Freq: Two times a day (BID) | ORAL | 1 refills | Status: DC | PRN

## 2021-12-14 NOTE — Telephone Encounter (Signed)
.  Called patient to schedule appointment per 4/28 inbasket, patient is aware of date and time.   ?

## 2021-12-14 NOTE — Progress Notes (Signed)
START ON PATHWAY REGIMEN - Melanoma and Other Skin Cancers ? ? ?  A cycle is every 21 days: ?    Pembrolizumab  ? ?**Always confirm dose/schedule in your pharmacy ordering system** ? ?Patient Characteristics: ?Melanoma, Cutaneous/Unknown Primary, Distant Metastases, Unresectable, No Brain Metastases, First Line, BRAF V600 Wild Type / BRAF V600 Results Pending or Unknown, Candidate for Immunotherapy ?Disease Classification: Melanoma ?Disease Subtype: Cutaneous ?BRAF V600 Mutation Status: BRAF V600 Wild Type (No Mutation) ?Therapeutic Status: Distant Metastases ?Line of Therapy: First Line ?Immunotherapy Candidate Status: Candidate for Immunotherapy ?Intent of Therapy: ?Non-Curative / Palliative Intent, Discussed with Patient ?

## 2021-12-15 ENCOUNTER — Encounter: Payer: Self-pay | Admitting: Hematology

## 2021-12-15 ENCOUNTER — Ambulatory Visit (HOSPITAL_COMMUNITY)
Admission: RE | Admit: 2021-12-15 | Discharge: 2021-12-15 | Disposition: A | Discharge status: Home / Self Care | Payer: PPO | Source: Ambulatory Visit | Attending: Hematology | Admitting: Hematology

## 2021-12-15 DIAGNOSIS — C4330 Malignant melanoma of unspecified part of face: Secondary | ICD-10-CM | POA: Insufficient documentation

## 2021-12-15 IMAGING — MR MR HEAD WO/W CM
13 series · 48 of 48 positions shown · IV contrast (gadavist)
Comparison: Head CT without contrast [DATE].

Brain MRI without and with contrast [DATE].

CLINICAL DATA: [AGE] male with metastatic melanoma. Staging.

EXAM:
MRI HEAD WITHOUT AND WITH CONTRAST
TECHNIQUE: Multiplanar, multiecho pulse sequences of the brain and surrounding
structures were obtained without and with intravenous contrast.
CONTRAST:  8mL GADAVIST GADOBUTROL 1 MMOL/ML IV SOLN

[Series 9: DWI · axial · 3.0mm · 1.36mm/px · z∈[-86,+69]mm · 6 of 108 slices shown (1 of 2)]
[im 1/108]
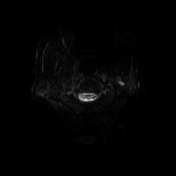
[im 22/108]
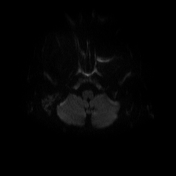
[im 43/108]
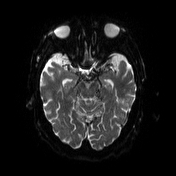
[im 65/108]
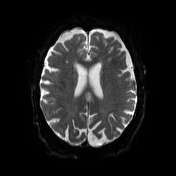
[im 86/108]
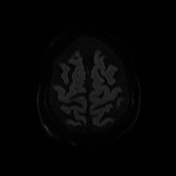
[im 108/108]
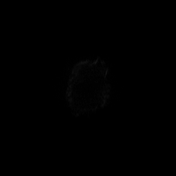

[Series 10: DWI · axial · 3.0mm · 1.36mm/px · z∈[-86,+69]mm · 3 of 54 slices shown (2 of 2)]
[im 1/54]
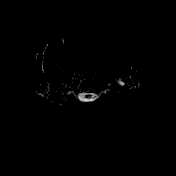
[im 27/54]
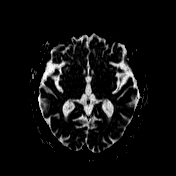
[im 54/54]
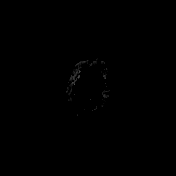

[Series 11: T1 · sagittal · 5.0mm · 0.78mm/px · 2 of 26 slices shown (1 of 2)]
[im 1/26]
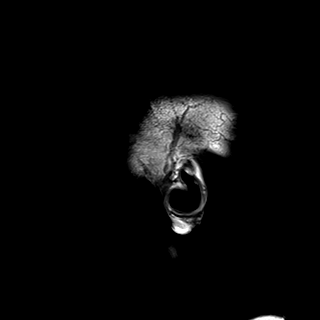
[im 26/26]
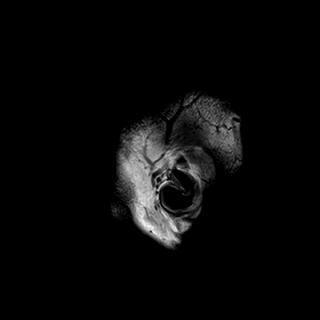

[Series 12: T2 · axial · 5.0mm · 0.62mm/px · z∈[-86,+73]mm · 2 of 26 slices shown]
[im 1/26]
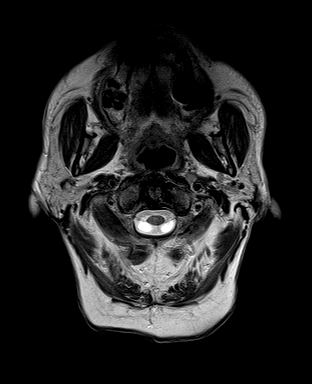
[im 26/26]
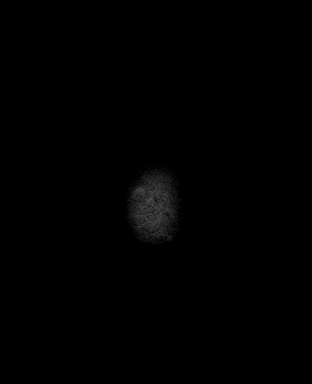

[Series 13: swi_images · axial · 3.0mm · 0.75mm/px · z∈[-87,+75]mm · 3 of 56 slices shown]
[im 1/56]
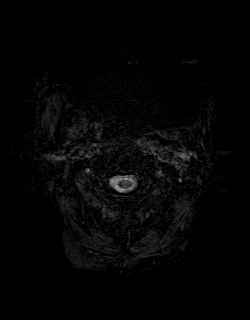
[im 28/56]
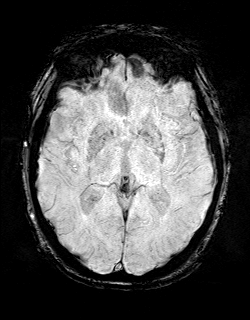
[im 56/56]
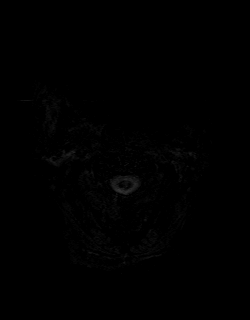

[Series 15: FLAIR · axial · 3.0mm · 0.75mm/px · z∈[-84,+72]mm · 3 of 54 slices shown]
[im 1/54]
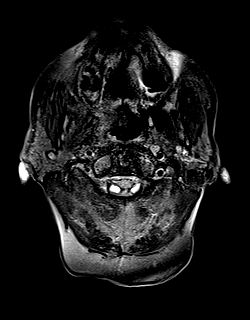
[im 27/54]
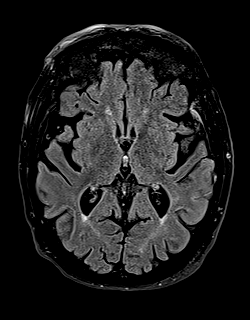
[im 54/54]
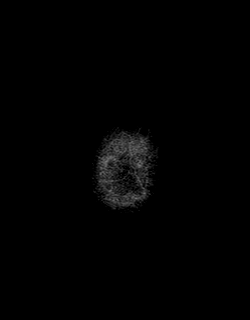

[Series 16: T1 · axial · 1.0mm · 0.94mm/px · z∈[-87,+69]mm · 9 of 160 slices shown (2 of 2)]
[im 1/160]
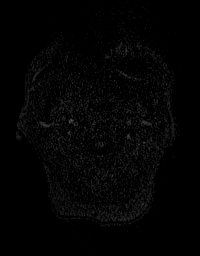
[im 20/160]
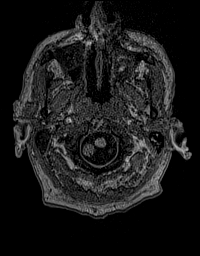
[im 40/160]
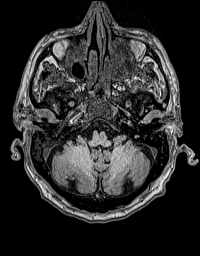
[im 60/160]
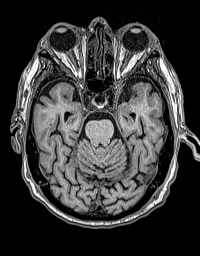
[im 80/160]
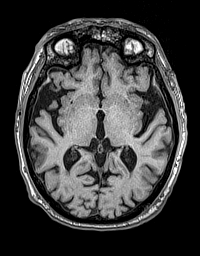
[im 100/160]
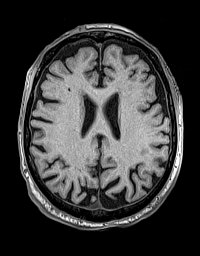
[im 120/160]
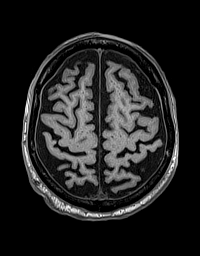
[im 140/160]
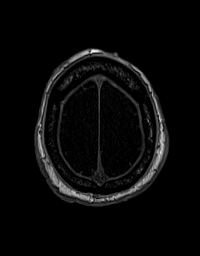
[im 160/160]
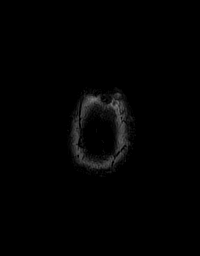

[Series 17: cor dwi_tracew · coronal · 5.0mm · 1.53mm/px · 3 of 58 slices shown]
[im 1/58]
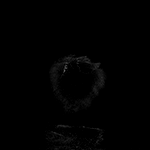
[im 29/58]
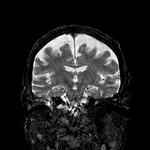
[im 58/58]
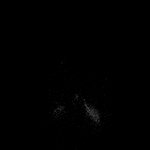

[Series 18: cor dwi_adc · coronal · 5.0mm · 1.53mm/px · 2 of 29 slices shown]
[im 1/29]
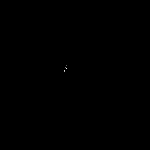
[im 29/29]
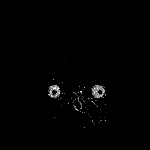

[Series 19: T2 post-contrast · coronal · 5.0mm · 0.57mm/px · 2 of 30 slices shown]
[im 1/30]
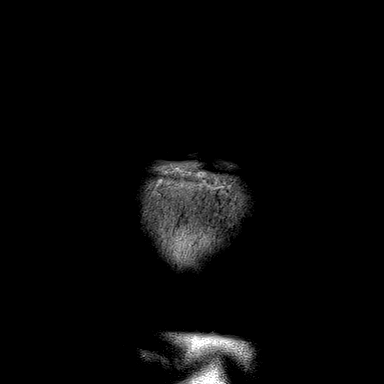
[im 30/30]
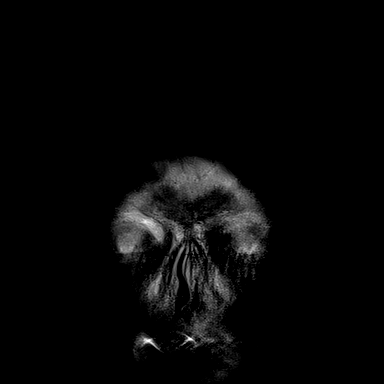

[Series 20: T1 post-contrast · axial · 1.0mm · 0.94mm/px · z∈[-87,+69]mm · 9 of 160 slices shown (1 of 3)]
[im 1/160]
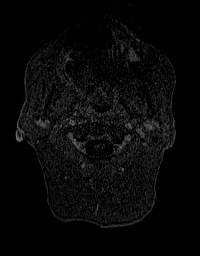
[im 20/160]
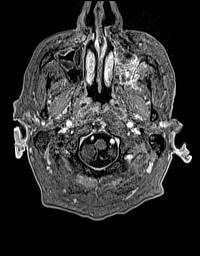
[im 40/160]
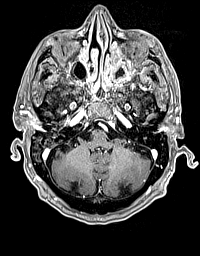
[im 60/160]
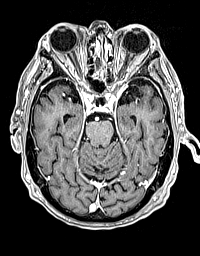
[im 80/160]
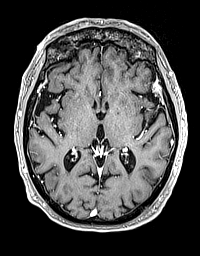
[im 100/160]
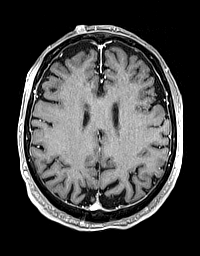
[im 120/160]
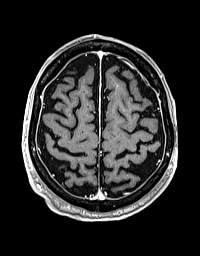
[im 140/160]
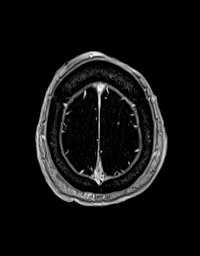
[im 160/160]
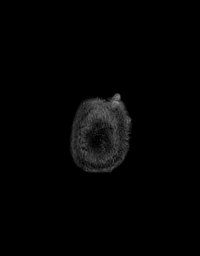

[Series 21: T1 post-contrast · coronal · 5.0mm · 0.43mm/px · 2 of 30 slices shown (2 of 3)]
[im 1/30]
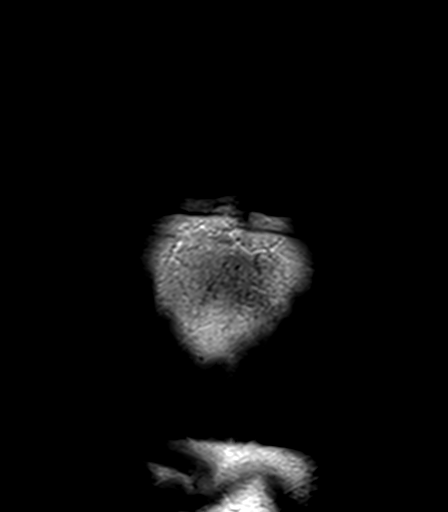
[im 30/30]
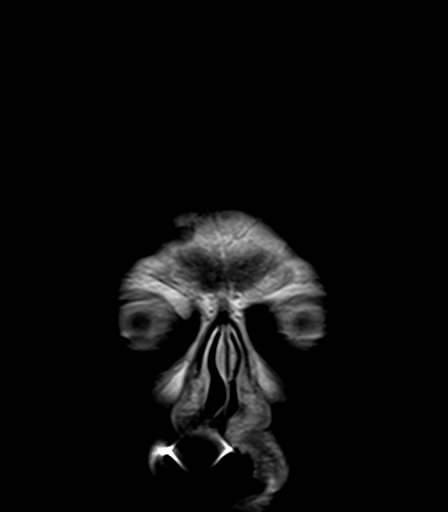

[Series 22: T1 post-contrast · sagittal · 5.0mm · 0.78mm/px · 2 of 26 slices shown (3 of 3)]
[im 1/26]
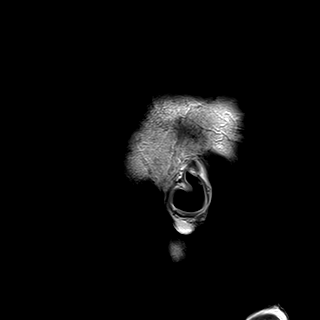
[im 26/26]
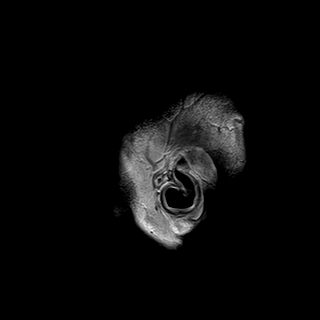

[48 of 48 positions shown; findings below may reference images not displayed]

FINDINGS: Brain: Oval enhancing, dural-based mass at the anterior left frontal
operculum is 18 x 10 by 18 mm (only 1-2 mm larger since [DATE]
when measured in the same way) and most compatible with chronic
meningioma. There is no significant mass effect or associated
cerebral edema.

No other abnormal dural thickening or intracranial enhancement is
identified. SWI redemonstrate chronic microhemorrhages in the left
deep cerebellar nuclei (series 13, image 13), right occipital lobe
(image 21) and left corona radiata (image 34) which appear unchanged
since [YX]. Of these, only the cerebellar lesion has faint T2 signal
correlation and as before may be a small cerebral cavernous venous
malformation.

No suspicious or T1 intrinsic lesion. No evidence of cerebral edema.
No restricted diffusion to suggest acute infarction. No midline
shift, mass effect, ventriculomegaly, or acute intracranial
hemorrhage. Cervicomedullary junction and pituitary are within
normal limits. Elsewhere gray and white matter signal remains normal
for age.

Vascular: Major intracranial vascular flow voids are stable. The
distal left vertebral artery appears dominant. The major dural
venous sinuses are enhancing and appear to be patent.

Skull and upper cervical spine: Generalized bone marrow signal
heterogeneity appears stable since [YX], with no destructive osseous
lesion identified. Negative visible spinal cord.

Sinuses/Orbits: Orbits appear stable and negative. Bilateral
paranasal sinus mucosal thickening has increased and is moderate.
There are new bilateral mastoid air cell effusions. Small volume new
retained secretions in the nasopharynx. Other Visible internal
auditory structures appear normal.

Other: There is a small indeterminate right forehead scalp soft
tissue lesion measuring about 10 mm on series 20, image 101 which is
in an area of chronic dermal or subcutaneous enhancement in [YX].
And several smaller dermal lesions are also increased at the vertex
on series 15, image 51 and series 11, image 12.
IMPRESSION: 1. No metastatic disease to the brain or acute intracranial
abnormality identified.

2. A chronic left hemisphere Meningioma is minimally larger since
[DATE] [DATE] ([DATE] cm), with no associated cerebral edema or mass
effect.

3. Cannot exclude multiple small dermal/cutaneous melanoma
metastases of the right forehead (series 20, image 101) and at the
vertex (series 11, image 12).

4. New paranasal sinus inflammation with bilateral mastoid
effusions.

## 2021-12-15 MED ORDER — GADOBUTROL 1 MMOL/ML IV SOLN
8.0000 mL | Freq: Once | INTRAVENOUS | Status: AC | PRN
  Administered 2021-12-15: 8 mL via INTRAVENOUS

## 2021-12-17 ENCOUNTER — Other Ambulatory Visit: Payer: Self-pay

## 2021-12-17 ENCOUNTER — Inpatient Hospital Stay: Payer: PPO | Attending: Hematology

## 2021-12-17 DIAGNOSIS — C9110 Chronic lymphocytic leukemia of B-cell type not having achieved remission: Secondary | ICD-10-CM | POA: Insufficient documentation

## 2021-12-17 DIAGNOSIS — C434 Malignant melanoma of scalp and neck: Secondary | ICD-10-CM | POA: Insufficient documentation

## 2021-12-17 DIAGNOSIS — C4339 Malignant melanoma of other parts of face: Secondary | ICD-10-CM | POA: Insufficient documentation

## 2021-12-17 DIAGNOSIS — Z87891 Personal history of nicotine dependence: Secondary | ICD-10-CM | POA: Insufficient documentation

## 2021-12-17 DIAGNOSIS — Z5112 Encounter for antineoplastic immunotherapy: Secondary | ICD-10-CM | POA: Insufficient documentation

## 2021-12-18 ENCOUNTER — Other Ambulatory Visit: Payer: Self-pay

## 2021-12-18 DIAGNOSIS — C4330 Malignant melanoma of unspecified part of face: Secondary | ICD-10-CM

## 2021-12-19 ENCOUNTER — Inpatient Hospital Stay: Payer: PPO

## 2021-12-19 ENCOUNTER — Other Ambulatory Visit: Payer: Self-pay

## 2021-12-19 ENCOUNTER — Telehealth: Payer: Self-pay

## 2021-12-19 VITALS — BP 141/66 | HR 61 | Temp 97.7°F | Resp 17 | Ht 71.0 in | Wt 171.0 lb

## 2021-12-19 DIAGNOSIS — C4339 Malignant melanoma of other parts of face: Secondary | ICD-10-CM | POA: Diagnosis not present

## 2021-12-19 DIAGNOSIS — C439 Malignant melanoma of skin, unspecified: Secondary | ICD-10-CM

## 2021-12-19 DIAGNOSIS — C434 Malignant melanoma of scalp and neck: Secondary | ICD-10-CM | POA: Diagnosis not present

## 2021-12-19 DIAGNOSIS — Z87891 Personal history of nicotine dependence: Secondary | ICD-10-CM | POA: Diagnosis not present

## 2021-12-19 DIAGNOSIS — Z5112 Encounter for antineoplastic immunotherapy: Secondary | ICD-10-CM | POA: Diagnosis present

## 2021-12-19 DIAGNOSIS — C9110 Chronic lymphocytic leukemia of B-cell type not having achieved remission: Secondary | ICD-10-CM | POA: Diagnosis present

## 2021-12-19 DIAGNOSIS — C4330 Malignant melanoma of unspecified part of face: Secondary | ICD-10-CM

## 2021-12-19 LAB — CBC WITH DIFFERENTIAL (CANCER CENTER ONLY)
Abs Immature Granulocytes: 0.18 10*3/uL — ABNORMAL HIGH (ref 0.00–0.07)
Basophils Absolute: 0.1 10*3/uL (ref 0.0–0.1)
Basophils Relative: 0 %
Eosinophils Absolute: 0.1 10*3/uL (ref 0.0–0.5)
Eosinophils Relative: 0 %
HCT: 34.7 % — ABNORMAL LOW (ref 39.0–52.0)
Hemoglobin: 10.7 g/dL — ABNORMAL LOW (ref 13.0–17.0)
Immature Granulocytes: 0 %
Lymphocytes Relative: 93 %
Lymphs Abs: 101.2 10*3/uL — ABNORMAL HIGH (ref 0.7–4.0)
MCH: 30.7 pg (ref 26.0–34.0)
MCHC: 30.8 g/dL (ref 30.0–36.0)
MCV: 99.4 fL (ref 80.0–100.0)
Monocytes Absolute: 3.4 10*3/uL — ABNORMAL HIGH (ref 0.1–1.0)
Monocytes Relative: 3 %
Neutro Abs: 4.3 10*3/uL (ref 1.7–7.7)
Neutrophils Relative %: 4 %
Platelet Count: 118 10*3/uL — ABNORMAL LOW (ref 150–400)
RBC: 3.49 MIL/uL — ABNORMAL LOW (ref 4.22–5.81)
RDW: 14.8 % (ref 11.5–15.5)
Smear Review: NORMAL
WBC Count: 109.3 10*3/uL (ref 4.0–10.5)
nRBC: 0 % (ref 0.0–0.2)

## 2021-12-19 LAB — CMP (CANCER CENTER ONLY)
ALT: 15 U/L (ref 0–44)
AST: 21 U/L (ref 15–41)
Albumin: 3.9 g/dL (ref 3.5–5.0)
Alkaline Phosphatase: 116 U/L (ref 38–126)
Anion gap: 9 (ref 5–15)
BUN: 19 mg/dL (ref 8–23)
CO2: 29 mmol/L (ref 22–32)
Calcium: 9.3 mg/dL (ref 8.9–10.3)
Chloride: 95 mmol/L — ABNORMAL LOW (ref 98–111)
Creatinine: 1.01 mg/dL (ref 0.61–1.24)
GFR, Estimated: >60 mL/min (ref >60)
Glucose, Bld: 143 mg/dL — ABNORMAL HIGH (ref 70–99)
Potassium: 3.5 mmol/L (ref 3.5–5.1)
Sodium: 133 mmol/L — ABNORMAL LOW (ref 135–145)
Total Bilirubin: 0.8 mg/dL (ref 0.3–1.2)
Total Protein: 6.5 g/dL (ref 6.5–8.1)

## 2021-12-19 LAB — LACTATE DEHYDROGENASE: LDH: 138 U/L (ref 98–192)

## 2021-12-19 MED ORDER — SODIUM CHLORIDE 0.9 % IV SOLN
200.0000 mg | Freq: Once | INTRAVENOUS | Status: AC
  Administered 2021-12-19: 200 mg via INTRAVENOUS
  Filled 2021-12-19: qty 200

## 2021-12-19 MED ORDER — SODIUM CHLORIDE 0.9 % IV SOLN: Freq: Once | INTRAVENOUS | Status: AC

## 2021-12-19 NOTE — Progress Notes (Signed)
CRITICAL VALUE STICKER ? ?CRITICAL VALUE:WBC: 109.3 ? ?DATE & TIME NOTIFIED: 12/19/21 10:12 am ? ?MD NOTIFIED: Irene Limbo ? ?TIME OF NOTIFICATION:10:14 am ? ?

## 2021-12-19 NOTE — Patient Instructions (Addendum)
[facility]   ?Discharge Instructions: ?Thank you for choosing [facility] to provide your oncology and hematology care.  ? ?If you have a lab appointment with the [facility], please go directly to the [facility] and check in at the registration area. ?  ?Wear comfortable clothing and clothing appropriate for easy access to any Portacath or PICC line.  ? ?We strive to give you quality time with your provider. You may need to reschedule your appointment if you arrive late (15 or more minutes).  Arriving late affects you and other patients whose appointments are after yours.  Also, if you miss three or more appointments without notifying the office, you may be dismissed from the clinic at the provider?s discretion.    ?  ?For prescription refill requests, have your pharmacy contact our office and allow 72 hours for refills to be completed.   ? ?Today you received the following chemotherapy and/or immunotherapy agents: pembrolizumab Beryle Flock)    ?  ?To help prevent nausea and vomiting after your treatment, we encourage you to take your nausea medication as directed. ? ?BELOW ARE SYMPTOMS THAT SHOULD BE REPORTED IMMEDIATELY: ?*FEVER GREATER THAN 100.4 F (38 ?C) OR HIGHER ?*CHILLS OR SWEATING ?*NAUSEA AND VOMITING THAT IS NOT CONTROLLED WITH YOUR NAUSEA MEDICATION ?*UNUSUAL SHORTNESS OF BREATH ?*UNUSUAL BRUISING OR BLEEDING ?*URINARY PROBLEMS (pain or burning when urinating, or frequent urination) ?*BOWEL PROBLEMS (unusual diarrhea, constipation, pain near the anus) ?TENDERNESS IN MOUTH AND THROAT WITH OR WITHOUT PRESENCE OF ULCERS (sore throat, sores in mouth, or a toothache) ?UNUSUAL RASH, SWELLING OR PAIN  ?UNUSUAL VAGINAL DISCHARGE OR ITCHING  ? ?Items with * indicate a potential emergency and should be followed up as soon as possible or go to the Emergency Department if any problems should occur. ? ?Please show the CHEMOTHERAPY ALERT CARD or IMMUNOTHERAPY ALERT  CARD at check-in to the Emergency Department and triage nurse. ? ?Should you have questions after your visit or need to cancel or reschedule your appointment, please contact [facility]  Dept: [phone]  and follow the prompts.  Office hours are 8:00 a.m. to 4:30 p.m. Monday - Friday. Please note that voicemails left after 4:00 p.m. may not be returned until the following business day.  We are closed weekends and major holidays. You have access to a nurse at all times for urgent questions. Please call the main number to the clinic Dept: [phone] and follow the prompts. ? ? ?For any non-urgent questions, you may also contact your provider using MyChart. We now offer e-Visits for anyone 19 and older to request care online for non-urgent symptoms. For details visit [email or web address]. ?  ?Also download the MyChart app! Go to the app store, search "MyChart", open the app, select [facility], and log in with your MyChart username and password. ? ?Due to Covid, a mask is required upon entering the hospital/clinic. If you do not have a mask, one will be given to you upon arrival. For doctor visits, patients may have 1 support person aged 89 or older with them. For treatment visits, patients cannot have anyone with them due to current Covid guidelines and our immunocompromised population.  ? ?Pembrolizumab injection ?What is this medication? ?PEMBROLIZUMAB (pem broe liz ue mab) is a monoclonal antibody. It is used to treat certain types of cancer. ?This medicine may be used for other purposes; ask your health care provider or pharmacist if you have questions. ?COMMON BRAND NAME(S):  Keytruda ?What should I tell my care team before I take this medication? ?They need to know if you have any of these conditions: ?autoimmune diseases like Crohn's disease, ulcerative colitis, or lupus ?have had or planning to have an allogeneic stem cell transplant (uses someone else's stem  cells) ?history of organ transplant ?history of chest radiation ?nervous system problems like myasthenia gravis or Guillain-Barre syndrome ?an unusual or allergic reaction to pembrolizumab, other medicines, foods, dyes, or preservatives ?pregnant or trying to get pregnant ?breast-feeding ?How should I use this medication? ?This medicine is for infusion into a vein. It is given by a health care professional in a hospital or clinic setting. ?A special MedGuide will be given to you before each treatment. Be sure to read this information carefully each time. ?Talk to your pediatrician regarding the use of this medicine in children. While this drug may be prescribed for children as young as 6 months for selected conditions, precautions do apply. ?Overdosage: If you think you have taken too much of this medicine contact a poison control center or emergency room at once. ?NOTE: This medicine is only for you. Do not share this medicine with others. ?What if I miss a dose? ?It is important not to miss your dose. Call your doctor or health care professional if you are unable to keep an appointment. ?What may interact with this medication? ?Interactions have not been studied. ?This list may not describe all possible interactions. Give your health care provider a list of all the medicines, herbs, non-prescription drugs, or dietary supplements you use. Also tell them if you smoke, drink alcohol, or use illegal drugs. Some items may interact with your medicine. ?What should I watch for while using this medication? ?Your condition will be monitored carefully while you are receiving this medicine. ?You may need blood work done while you are taking this medicine. ?Do not become pregnant while taking this medicine or for 4 months after stopping it. Women should inform their doctor if they wish to become pregnant or think they might be pregnant. There is a potential for serious side effects to an unborn child. Talk to your health care  professional or pharmacist for more information. Do not breast-feed an infant while taking this medicine or for 4 months after the last dose. ?What side effects may I notice from receiving this medication? ?Side effects that you should report to your doctor or health care professional as soon as possible: ?allergic reactions like skin rash, itching or hives, swelling of the face, lips, or tongue ?bloody or black, tarry ?breathing problems ?changes in vision ?chest pain ?chills ?confusion ?constipation ?cough ?diarrhea ?dizziness or feeling faint or lightheaded ?fast or irregular heartbeat ?fever ?flushing ?joint pain ?low blood counts - this medicine may decrease the number of white blood cells, red blood cells and platelets. You may be at increased risk for infections and bleeding. ?muscle pain ?muscle weakness ?pain, tingling, numbness in the hands or feet ?persistent headache ?redness, blistering, peeling or loosening of the skin, including inside the mouth ?signs and symptoms of high blood sugar such as dizziness; dry mouth; dry skin; fruity breath; nausea; stomach pain; increased hunger or thirst; increased urination ?signs and symptoms of kidney injury like trouble passing urine or change in the amount of urine ?signs and symptoms of liver injury like dark urine, light-colored stools, loss of appetite, nausea, right upper belly pain, yellowing of the eyes or skin ?sweating ?swollen lymph nodes ?weight loss ?Side effects that usually do not  require medical attention (report to your doctor or health care professional if they continue or are bothersome): ?decreased appetite ?hair loss ?tiredness ?This list may not describe all possible side effects. Call your doctor for medical advice about side effects. You may report side effects to FDA at 1-800-FDA-1088. ?Where should I keep my medication? ?This drug is given in a hospital or clinic and will not be stored at home. ?NOTE: This sheet is a summary. It may not  cover all possible information. If you have questions about this medicine, talk to your doctor, pharmacist, or health care provider. ?? 2023 Elsevier/Gold Standard (2021-07-06 00:00:00) ? ? ?

## 2021-12-19 NOTE — Progress Notes (Signed)
Dr Irene Limbo aware of pt's labs to day and pt is ok for tx.  ?

## 2021-12-19 NOTE — Telephone Encounter (Signed)
Notified Patient of prior authorization approval for Ondansetron '8mg'$  Tablets. Medication is approved from 12/15/2021 through 08/18/2022. No other needs or concerns voiced at this time. ?

## 2021-12-20 ENCOUNTER — Ambulatory Visit (HOSPITAL_COMMUNITY)
Admission: RE | Admit: 2021-12-20 | Discharge: 2021-12-20 | Disposition: A | Discharge status: Home / Self Care | Payer: PPO | Source: Ambulatory Visit | Attending: Hematology | Admitting: Hematology

## 2021-12-20 DIAGNOSIS — C4330 Malignant melanoma of unspecified part of face: Secondary | ICD-10-CM | POA: Diagnosis not present

## 2021-12-20 LAB — GLUCOSE, CAPILLARY: Glucose-Capillary: 148 mg/dL — ABNORMAL HIGH (ref 70–99)

## 2021-12-20 MED ORDER — FLUDEOXYGLUCOSE F - 18 (FDG) INJECTION
8.2700 | Freq: Once | INTRAVENOUS | Status: AC
  Administered 2021-12-20: 8.27 via INTRAVENOUS

## 2021-12-25 ENCOUNTER — Other Ambulatory Visit: Payer: Self-pay

## 2021-12-25 DIAGNOSIS — C439 Malignant melanoma of skin, unspecified: Secondary | ICD-10-CM

## 2022-01-01 ENCOUNTER — Inpatient Hospital Stay: Payer: PPO

## 2022-01-01 ENCOUNTER — Other Ambulatory Visit: Payer: Self-pay

## 2022-01-01 ENCOUNTER — Inpatient Hospital Stay (HOSPITAL_BASED_OUTPATIENT_CLINIC_OR_DEPARTMENT_OTHER): Payer: PPO | Admitting: Hematology

## 2022-01-01 VITALS — BP 138/59 | HR 59 | Temp 97.9°F | Resp 20 | Wt 172.9 lb

## 2022-01-01 DIAGNOSIS — C439 Malignant melanoma of skin, unspecified: Secondary | ICD-10-CM

## 2022-01-01 DIAGNOSIS — C434 Malignant melanoma of scalp and neck: Secondary | ICD-10-CM

## 2022-01-01 DIAGNOSIS — C9110 Chronic lymphocytic leukemia of B-cell type not having achieved remission: Secondary | ICD-10-CM | POA: Diagnosis not present

## 2022-01-01 DIAGNOSIS — Z5112 Encounter for antineoplastic immunotherapy: Secondary | ICD-10-CM | POA: Diagnosis not present

## 2022-01-01 LAB — CMP (CANCER CENTER ONLY)
ALT: 13 U/L (ref 0–44)
AST: 18 U/L (ref 15–41)
Albumin: 3.9 g/dL (ref 3.5–5.0)
Alkaline Phosphatase: 93 U/L (ref 38–126)
Anion gap: 7 (ref 5–15)
BUN: 24 mg/dL — ABNORMAL HIGH (ref 8–23)
CO2: 29 mmol/L (ref 22–32)
Calcium: 8.9 mg/dL (ref 8.9–10.3)
Chloride: 100 mmol/L (ref 98–111)
Creatinine: 1.18 mg/dL (ref 0.61–1.24)
GFR, Estimated: 58 mL/min — ABNORMAL LOW (ref >60)
Glucose, Bld: 127 mg/dL — ABNORMAL HIGH (ref 70–99)
Potassium: 3.7 mmol/L (ref 3.5–5.1)
Sodium: 136 mmol/L (ref 135–145)
Total Bilirubin: 0.6 mg/dL (ref 0.3–1.2)
Total Protein: 5.9 g/dL — ABNORMAL LOW (ref 6.5–8.1)

## 2022-01-01 LAB — LACTATE DEHYDROGENASE: LDH: 143 U/L (ref 98–192)

## 2022-01-01 LAB — CBC WITH DIFFERENTIAL (CANCER CENTER ONLY)
Abs Immature Granulocytes: 0 10*3/uL (ref 0.00–0.07)
Basophils Absolute: 0 10*3/uL (ref 0.0–0.1)
Basophils Relative: 0 %
Eosinophils Absolute: 0 10*3/uL (ref 0.0–0.5)
Eosinophils Relative: 0 %
HCT: 32.7 % — ABNORMAL LOW (ref 39.0–52.0)
Hemoglobin: 10.5 g/dL — ABNORMAL LOW (ref 13.0–17.0)
Lymphocytes Relative: 95 %
Lymphs Abs: 43.4 10*3/uL — ABNORMAL HIGH (ref 0.7–4.0)
MCH: 32 pg (ref 26.0–34.0)
MCHC: 32.1 g/dL (ref 30.0–36.0)
MCV: 99.7 fL (ref 80.0–100.0)
Monocytes Absolute: 0 10*3/uL — ABNORMAL LOW (ref 0.1–1.0)
Monocytes Relative: 0 %
Neutro Abs: 2.3 10*3/uL (ref 1.7–7.7)
Neutrophils Relative %: 5 %
Platelet Count: 106 10*3/uL — ABNORMAL LOW (ref 150–400)
RBC: 3.28 MIL/uL — ABNORMAL LOW (ref 4.22–5.81)
RDW: 14.6 % (ref 11.5–15.5)
Smear Review: NORMAL
WBC Count: 45.7 10*3/uL — ABNORMAL HIGH (ref 4.0–10.5)
nRBC: 0 % (ref 0.0–0.2)

## 2022-01-07 ENCOUNTER — Encounter: Payer: Self-pay | Admitting: Hematology

## 2022-01-07 NOTE — Progress Notes (Signed)
HEMATOLOGY/ONCOLOGY CLINIC NOTE  Date of Service: .01/01/2022   Patient Care Team: Corrington, Delsa Grana, MD as PCP - General (Family Medicine)  CHIEF COMPLAINTS/PURPOSE OF CONSULTATION:  Toxicity check for newly started immunotherapy for metastatic melanoma and review of PET scan and MRI brain.  HISTORY OF PRESENTING ILLNESS:  Please see previous note for details on initial presentation  INTERVAL HISTORY:   Thomas Zavala is a  88 y.o. male returns today accompanied by his daughter for continued valuation and management of his metastatic melanoma and for toxicity assessment with his immunotherapy.  Patient notes he tolerated his first dose of pembrolizumab without any notable toxicities.  No new skin rashes.  No diarrhea. His labs, PET CT scan and MRI of the brain were discussed with him in details.   MEDICAL HISTORY:  Past Medical History:  Diagnosis Date   Abdominal hernia    BPH (benign prostatic hyperplasia) 07/25/2016   CLL (chronic lymphocytic leukemia) (HCC)    Heart murmur    Hypertension    Melanoma of forehead ([place])    Melanoma of scalp ([place])    PVC's (premature ventricular contractions)    Type 2 diabetes mellitus without complication, without long-term current use of insulin ([place]) 07/25/2016    SURGICAL HISTORY: Past Surgical History:  Procedure Laterality Date   EYE SURGERY     eye lids   IRRIGATION AND DEBRIDEMENT OF WOUND WITH SPLIT THICKNESS SKIN GRAFT Left 01/05/2020   Procedure: SPLIT THICKNESS SKIN GRAFT Left shoulder;  Surgeon: Wallace Going, DO;  Location: [facility];  Service: Plastics;  Laterality: Left;   MASS EXCISION N/A 01/05/2020   Procedure: EXCISION OF FOREHEAD  MELANOMA;  Surgeon: Wallace Going, DO;  Location: [facility];  Service: Plastics;  Laterality: N/A;   MELANOMA EXCISION  02/14/2021   Procedure: MELANOMA EXCISION of scalp;  Surgeon: Wallace Going, DO;  Location: [facility];  Service: Plastics;;   PAROTIDECTOMY Left 01/05/2020    Procedure: PAROTIDECTOMY;  Surgeon: Izora Gala, MD;  Location: [facility];  Service: ENT;  Laterality: Left;   PROSTATE SURGERY     SENTINEL NODE BIOPSY Left 01/05/2020   Procedure: Sentinel Node Biopsy;  Surgeon: Izora Gala, MD;  Location: [facility] OR;  Service: ENT;  Laterality: Left;    SOCIAL HISTORY: Social History   Socioeconomic History   Marital status: Widowed    Spouse name: Not on file   Number of children: 3   Years of education: Not on file   Highest education level: Not on file  Occupational History   Not on file  Tobacco Use   Smoking status: Former    Packs/day: 1.00    Years: 25.00    Pack years: 25.00    Types: Cigarettes    Quit date: 43    Years since quitting: 53.4   Smokeless tobacco: Never   Tobacco comments:    quit in the 70's  Vaping Use   Vaping Use: Never used  Substance and Sexual Activity   Alcohol use: Yes    Alcohol/week: 7.0 standard drinks    Types: 7 Standard drinks or equivalent per week    Comment: socially.   Drug use: No   Sexual activity: Not on file  Other Topics Concern   Not on file  Social History Narrative   Not on file   Social Determinants of Health   Financial Resource Strain: Not on file  Food Insecurity: Not on file  Transportation Needs: Not on file  Physical Activity: Not on file  Stress: Not on file  Social Connections: Not on file  Intimate Partner Violence: Not on file    FAMILY HISTORY: Family History  Problem Relation Age of Onset   Hypertension Father     ALLERGIES:  is allergic to tape and fluorouracil.  MEDICATIONS:  Current Outpatient Medications  Medication Sig Dispense Refill   acetaminophen (TYLENOL) 500 MG tablet Take 500-1,000 mg by mouth every 6 (six) hours as needed (for pain).     carboxymethylcellul-glycerin (REFRESH OPTIVE) 0.5-0.9 % ophthalmic solution Place 1 drop into both eyes 3 (three) times daily as needed for dry eyes.     Dextromethorphan-guaiFENesin (MUCINEX DM PO) Take 1 Dose  by mouth at bedtime as needed (cough, sleep).     finasteride (PROSCAR) 5 MG tablet Take 5 mg by mouth daily.     hydrALAZINE (APRESOLINE) 25 MG tablet TAKE 1 TABLET BY MOUTH THREE TIMES A DAY 270 tablet 1   loratadine (CLARITIN) 10 MG tablet Take 10 mg by mouth daily as needed for allergies or rhinitis.     losartan-hydrochlorothiazide (HYZAAR) 100-25 MG tablet Take 1 tablet by mouth daily.      metFORMIN (GLUCOPHAGE-XR) 500 MG 24 hr tablet Take 500 mg by mouth daily with breakfast.     ondansetron (ZOFRAN) 8 MG tablet Take 1 tablet (8 mg total) by mouth 2 (two) times daily as needed (Nausea or vomiting). 30 tablet 1   PFIZER COVID-19 VAC BIVALENT injection      pindolol (VISKEN) 5 MG tablet TAKE 1 TABLET BY MOUTH TWICE A DAY 180 tablet 1   pindolol (VISKEN) 5 MG tablet Take by mouth.     pravastatin (PRAVACHOL) 40 MG tablet Take 40 mg by mouth daily.      No current facility-administered medications for this visit.    REVIEW OF SYSTEMS: 10 Point review of Systems was done is negative except as noted above.  PHYSICAL EXAMINATION: ECOG PERFORMANCE STATUS: 2 - Symptomatic, <50% confined to bed  . Vitals:   01/01/22 0908  BP: (!) 138/59  Pulse: (!) 59  Resp: 20  Temp: 97.9 F (36.6 C)  SpO2: 100%   Filed Weights   01/01/22 0908  Weight: 172 lb 14.4 oz (78.4 kg)   Body mass index is 24.11 kg/m.   NAD GENERAL:alert, in no acute distress and comfortable NECK: supple, left upper neck swelling LYMPH:  no palpable lymphadenopathy in the cervical, axillary or inguinal regions LUNGS: clear to auscultation b/l with normal respiratory effort HEART: regular rate & rhythm ABDOMEN:  normoactive bowel sounds , non tender, not distended. Extremity: 1+ bilateral pedal edema PSYCH: alert & oriented x 3 with fluent speech NEURO: no focal motor/sensory deficits   LABORATORY DATA:  I have reviewed the data as listed  .    Latest Ref Rng & Units 01/01/2022    8:44 AM 12/19/2021    9:54  AM 12/13/2021    9:49 AM  CBC  WBC 4.0 - 10.5 K/uL 45.7   109.3   101.0    Hemoglobin 13.0 - 17.0 g/dL 10.5   10.7   10.9    Hematocrit 39.0 - 52.0 % 32.7   34.7   33.8    Platelets 150 - 400 K/uL 106   118   118      .    Latest Ref Rng & Units 01/01/2022    8:44 AM 12/19/2021    9:54 AM 12/13/2021    9:49 AM  CMP  Glucose 70 - 99 mg/dL 127   143   143    BUN 8 - 23 mg/dL '24   19   16    '$ Creatinine 0.61 - 1.24 mg/dL 1.18   1.01   0.97    Sodium 135 - 145 mmol/L 136   133   130    Potassium 3.5 - 5.1 mmol/L 3.7   3.5   3.7    Chloride 98 - 111 mmol/L 100   95   95    CO2 22 - 32 mmol/L '29   29   30    '$ Calcium 8.9 - 10.3 mg/dL 8.9   9.3   9.0    Total Protein 6.5 - 8.1 g/dL 5.9   6.5   6.1    Total Bilirubin 0.3 - 1.2 mg/dL 0.6   0.8   0.8    Alkaline Phos 38 - 126 U/L 93   116   128    AST 15 - 41 U/L '18   21   18    '$ ALT 0 - 44 U/L '13   15   10     '$ . Lab Results  Component Value Date   LDH 143 01/01/2022    07/15/16 Cytogenetics and Flow Cytometry:   11/12/19 of Molecular Pathology   01/05/20 of Surgical Pathology Report [id number])  RADIOGRAPHIC STUDIES: I have personally reviewed the radiological images as listed and agreed with the findings in the report. MR Brain W Wo Contrast  Result Date: 12/15/2021 CLINICAL DATA:  88 year old male with metastatic melanoma. Staging. EXAM: MRI HEAD WITHOUT AND WITH CONTRAST TECHNIQUE: Multiplanar, multiecho pulse sequences of the brain and surrounding structures were obtained without and with intravenous contrast. CONTRAST:  55m GADAVIST GADOBUTROL 1 MMOL/ML IV SOLN COMPARISON:  Head CT without contrast 04/16/2021. Brain MRI without and with contrast 02/08/2020. FINDINGS: Brain: Oval enhancing, dural-based mass at the anterior left frontal operculum is 18 x 10 by 18 mm (only 1-2 mm larger since June 2021 when measured in the same way) and most compatible with chronic meningioma. There is no significant mass effect or associated  cerebral edema. No other abnormal dural thickening or intracranial enhancement is identified. SWI redemonstrate chronic microhemorrhages in the left deep cerebellar nuclei (series 13, image 13), right occipital lobe (image 21) and left corona radiata (image 34) which appear unchanged since 2021. Of these, only the cerebellar lesion has faint T2 signal correlation and as before may be a small cerebral cavernous venous malformation. No suspicious or T1 intrinsic lesion. No evidence of cerebral edema. No restricted diffusion to suggest acute infarction. No midline shift, mass effect, ventriculomegaly, or acute intracranial hemorrhage. Cervicomedullary junction and pituitary are within normal limits. Elsewhere gray and white matter signal remains normal for age. Vascular: Major intracranial vascular flow voids are stable. The distal left vertebral artery appears dominant. The major dural venous sinuses are enhancing and appear to be patent. Skull and upper cervical spine: Generalized bone marrow signal heterogeneity appears stable since 2021, with no destructive osseous lesion identified. Negative visible spinal cord. Sinuses/Orbits: Orbits appear stable and negative. Bilateral paranasal sinus mucosal thickening has increased and is moderate. There are new bilateral mastoid air cell effusions. Small volume new retained secretions in the nasopharynx. Other Visible internal auditory structures appear normal. Other: There is a small indeterminate right forehead scalp soft tissue lesion measuring about 10 mm on series 20, image 101 which is in an area of chronic dermal or subcutaneous enhancement  in 2021. And several smaller dermal lesions are also increased at the vertex on series 15, image 51 and series 11, image 12. IMPRESSION: 1. No metastatic disease to the brain or acute intracranial abnormality identified. 2. A chronic left hemisphere Meningioma is minimally larger since June 2021 (1.8 cm), with no associated  cerebral edema or mass effect. 3. Cannot exclude multiple small dermal/cutaneous melanoma metastases of the right forehead (series 20, image 101) and at the vertex (series 11, image 12). 4. New paranasal sinus inflammation with bilateral mastoid effusions. Electronically Signed   By: Genevie Ann M.D.   On: 12/15/2021 09:49   NM PET Image Restage (PS) Whole Body  Result Date: 12/20/2021 CLINICAL DATA:  Subsequent treatment strategy for recurrent stage III melanoma. Multiple scalp lesions and cervical lymphadenopathy. History of CLL. EXAM: NUCLEAR MEDICINE PET WHOLE BODY TECHNIQUE: 8.3 mCi F-18 FDG was injected intravenously. Full-ring PET imaging was performed from the head to foot after the radiotracer. CT data was obtained and used for attenuation correction and anatomic localization. Fasting blood glucose: 148 mg/dl COMPARISON:  MRI brain dated 12/15/2021. CTA chest dated 07/26/2021. PET-CT dated 02/28/2021. FINDINGS: Mediastinal blood pool activity: SUV max 2.6 HEAD/NECK: Multiple enlarged left cervical lymph nodes (series 4/images 55, 58, 64, and 68), measuring up to 18 mm short axis, max SUV 11.6. This appearance is favored to be related to the patient's known metastatic melanoma. Multiple cutaneous soft tissue lesions overlying the scalp (for example, series 4/image 9). 8 mm lesion overlying the right frontal bone (series 4/image 20), max SUV 3.7. Incidental CT findings: none CHEST: Thoracic lymphadenopathy, including a dominant 18 mm short axis right paratracheal node (series 4/image 98), unchanged. Max SUV 3.1. This appearance is favored to be related to the patient's known CLL. No suspicious pulmonary nodules. Incidental CT findings: Atherosclerotic calcifications of the aortic arch. 3 vessel coronary atherosclerosis. ABDOMEN/PELVIS: No abnormal hypermetabolism in the liver, spleen, pancreas, or adrenal glands. Splenomegaly.  Representative splenic hypermetabolism 2.7. No hypermetabolic abdominopelvic  lymphadenopathy. Small upper abdominal and retroperitoneal nodes which do not meet pathologic CT size criteria. Incidental CT findings: Atherosclerotic calcifications the abdominal aorta and branch vessels. Left colonic diverticulosis, without evidence of diverticulitis. Prostatomegaly. SKELETON: No focal hypermetabolic activity to suggest skeletal metastasis. Incidental CT findings: Degenerative changes of the visualized thoracolumbar spine. EXTREMITIES: No abnormal hypermetabolic activity in the lower extremities. Incidental CT findings: none IMPRESSION: Multiple cutaneous scalp lesions, suspicious for recurrence in this patient with known melanoma. Associated left cervical nodal metastases. Stable thoracic lymphadenopathy, favored to be related to the patient's known CLL. Electronically Signed   By: Julian Hy M.D.   On: 12/20/2021 20:27     ASSESSMENT & PLAN:  88 y.o. male with  1. Chronic Lymphocytic Leukemia Diagnosed in November 2017 with flow cytometry 07/15/16 Cytogenetics revealed Trisomy 12  09/03/16 CT A/P revealed Borderline splenomegaly with 1.3 cm low-attenuation lesion in the anterior aspect of spleen. Mild upper abdominal and right iliac lymphadenopathy. 4 mm indeterminate pulmonary nodule in right lung base. Recommend continued attention on follow-up CT. Incidentally noted benign right hepatic lobe hemangioma, mildly and enlarged prostate, and colonic diverticulosis  10/10/17 CT Chest revealed Scattered small bilateral pulmonary nodules, including two dominant right middle lobe nodules measuring 5-6 mm, unchanged, likely benign. If clinically warranted, consider a single follow-up CT chest in 1 year. Mild mediastinal and bilateral hilar lymphadenopathy, likely related to the patient's known CLL. Mild splenomegaly, incompletely visualized. Aortic Atherosclerosis and Emphysema  2. 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  patient declined adjuvant immunotheraphy Now with multiple cutaneous metastases from melanoma over the scalp as well as left cervical lymph node metastasis based on PET scan.  On 12/20/2021.  MRI brain shows chronic meningioma but no evidence of metastatic melanoma to the brain.  PLAN: -Patient's labs were reviewed with him in detail CBC stable with stable CLL.  CMP stable.  LDH within normal limits. -PET CT scan was reviewed with him in detail he has multiple cutaneous metastases from the melanoma over his scalp as well as left upper cervical lymphadenopathy. MRI brain shows chronic stable meningioma and no evidence of metastatic melanoma to the brain.  Patient with no significant notable toxicities from his first cycle of pembrolizumab. We shall continue his pembrolizumab every 3 weeks until toxicity arises or there is overt disease progression from his melanoma. No indication for treatment of CLL at this time.  FOLLOW UP: Please schedule cycle 2 of pembrolizumab with labs. Please schedule cycle 3 of pembrolizumab with labs and MD visit  The total time spent in the appointment was 30 minutes*.  All of the patient's questions were answered with apparent satisfaction. The patient knows to call the clinic with any problems, questions or concerns.   GSullivan LoneMD MS AAHIVMS SMitchell County Memorial HospitalCChadron Community Hospital And Health ServicesHematology/Oncology Physician CB[facility] .*Total Encounter Time as defined by the Centers for Medicare and Medicaid Services includes, in addition to the face-to-face time of a patient visit (documented in the note above) non-face-to-face time: obtaining and reviewing outside history, ordering and reviewing medications, tests or procedures, care coordination (communications with other health care professionals or caregivers) and documentation in the medical record.

## 2022-01-08 ENCOUNTER — Other Ambulatory Visit: Payer: Self-pay

## 2022-01-08 DIAGNOSIS — C439 Malignant melanoma of skin, unspecified: Secondary | ICD-10-CM

## 2022-01-09 ENCOUNTER — Inpatient Hospital Stay: Payer: PPO

## 2022-01-09 ENCOUNTER — Other Ambulatory Visit: Payer: Self-pay | Admitting: Hematology

## 2022-01-09 ENCOUNTER — Other Ambulatory Visit: Payer: Self-pay

## 2022-01-09 VITALS — BP 124/55 | HR 69 | Temp 98.0°F | Resp 18 | Wt 173.1 lb

## 2022-01-09 DIAGNOSIS — Z5112 Encounter for antineoplastic immunotherapy: Secondary | ICD-10-CM | POA: Diagnosis not present

## 2022-01-09 DIAGNOSIS — C434 Malignant melanoma of scalp and neck: Secondary | ICD-10-CM

## 2022-01-09 DIAGNOSIS — C439 Malignant melanoma of skin, unspecified: Secondary | ICD-10-CM

## 2022-01-09 LAB — CBC WITH DIFFERENTIAL (CANCER CENTER ONLY)
Abs Immature Granulocytes: 0.04 10*3/uL (ref 0.00–0.07)
Basophils Absolute: 0 10*3/uL (ref 0.0–0.1)
Basophils Relative: 0 %
Eosinophils Absolute: 0.2 10*3/uL (ref 0.0–0.5)
Eosinophils Relative: 0 %
HCT: 32.4 % — ABNORMAL LOW (ref 39.0–52.0)
Hemoglobin: 10.4 g/dL — ABNORMAL LOW (ref 13.0–17.0)
Immature Granulocytes: 0 %
Lymphocytes Relative: 91 %
Lymphs Abs: 42.5 10*3/uL — ABNORMAL HIGH (ref 0.7–4.0)
MCH: 31.8 pg (ref 26.0–34.0)
MCHC: 32.1 g/dL (ref 30.0–36.0)
MCV: 99.1 fL (ref 80.0–100.0)
Monocytes Absolute: 2.1 10*3/uL — ABNORMAL HIGH (ref 0.1–1.0)
Monocytes Relative: 4 %
Neutro Abs: 2.3 10*3/uL (ref 1.7–7.7)
Neutrophils Relative %: 5 %
Platelet Count: 91 10*3/uL — ABNORMAL LOW (ref 150–400)
RBC: 3.27 MIL/uL — ABNORMAL LOW (ref 4.22–5.81)
RDW: 14.6 % (ref 11.5–15.5)
Smear Review: NORMAL
WBC Count: 47.1 10*3/uL — ABNORMAL HIGH (ref 4.0–10.5)
nRBC: 0 % (ref 0.0–0.2)

## 2022-01-09 LAB — CMP (CANCER CENTER ONLY)
ALT: 10 U/L (ref 0–44)
AST: 17 U/L (ref 15–41)
Albumin: 3.9 g/dL (ref 3.5–5.0)
Alkaline Phosphatase: 88 U/L (ref 38–126)
Anion gap: 6 (ref 5–15)
BUN: 28 mg/dL — ABNORMAL HIGH (ref 8–23)
CO2: 27 mmol/L (ref 22–32)
Calcium: 9 mg/dL (ref 8.9–10.3)
Chloride: 104 mmol/L (ref 98–111)
Creatinine: 1.17 mg/dL (ref 0.61–1.24)
GFR, Estimated: 59 mL/min — ABNORMAL LOW (ref >60)
Glucose, Bld: 155 mg/dL — ABNORMAL HIGH (ref 70–99)
Potassium: 3.9 mmol/L (ref 3.5–5.1)
Sodium: 137 mmol/L (ref 135–145)
Total Bilirubin: 0.6 mg/dL (ref 0.3–1.2)
Total Protein: 5.9 g/dL — ABNORMAL LOW (ref 6.5–8.1)

## 2022-01-09 LAB — LACTATE DEHYDROGENASE: LDH: 154 U/L (ref 98–192)

## 2022-01-09 MED ORDER — SODIUM CHLORIDE 0.9 % IV SOLN: Freq: Once | INTRAVENOUS | Status: AC

## 2022-01-09 MED ORDER — SODIUM CHLORIDE 0.9 % IV SOLN
200.0000 mg | Freq: Once | INTRAVENOUS | Status: AC
  Administered 2022-01-09: 200 mg via INTRAVENOUS
  Filled 2022-01-09: qty 200

## 2022-01-09 NOTE — Progress Notes (Signed)
Per Dr Lorenso Courier, okay to treat with platelets of 91

## 2022-01-09 NOTE — Patient Instructions (Signed)
[facility] ONCOLOGY   Discharge Instructions: Thank you for choosing [facility] to provide your oncology and hematology care.   If you have a lab appointment with the [facility], please go directly to the [facility] and check in at the registration area.   Wear comfortable clothing and clothing appropriate for easy access to any Portacath or PICC line.   We strive to give you quality time with your provider. You may need to reschedule your appointment if you arrive late (15 or more minutes).  Arriving late affects you and other patients whose appointments are after yours.  Also, if you miss three or more appointments without notifying the office, you may be dismissed from the clinic at the provider's discretion.      For prescription refill requests, have your pharmacy contact our office and allow 72 hours for refills to be completed.    Today you received the following chemotherapy and/or immunotherapy agents: pembrolizumab Beryle Flock)      To help prevent nausea and vomiting after your treatment, we encourage you to take your nausea medication as directed.  BELOW ARE SYMPTOMS THAT SHOULD BE REPORTED IMMEDIATELY: *FEVER GREATER THAN 100.4 F (38 C) OR HIGHER *CHILLS OR SWEATING *NAUSEA AND VOMITING THAT IS NOT CONTROLLED WITH YOUR NAUSEA MEDICATION *UNUSUAL SHORTNESS OF BREATH *UNUSUAL BRUISING OR BLEEDING *URINARY PROBLEMS (pain or burning when urinating, or frequent urination) *BOWEL PROBLEMS (unusual diarrhea, constipation, pain near the anus) TENDERNESS IN MOUTH AND THROAT WITH OR WITHOUT PRESENCE OF ULCERS (sore throat, sores in mouth, or a toothache) UNUSUAL RASH, SWELLING OR PAIN  UNUSUAL VAGINAL DISCHARGE OR ITCHING   Items with * indicate a potential emergency and should be followed up as soon as possible or go to the Emergency Department if any problems should occur.  Please show the CHEMOTHERAPY ALERT CARD or IMMUNOTHERAPY ALERT  CARD at check-in to the Emergency Department and triage nurse.  Should you have questions after your visit or need to cancel or reschedule your appointment, please contact [facility]  Dept: [phone]  and follow the prompts.  Office hours are 8:00 a.m. to 4:30 p.m. Monday - Friday. Please note that voicemails left after 4:00 p.m. may not be returned until the following business day.  We are closed weekends and major holidays. You have access to a nurse at all times for urgent questions. Please call the main number to the clinic Dept: [phone] and follow the prompts.   For any non-urgent questions, you may also contact your provider using MyChart. We now offer e-Visits for anyone 96 and older to request care online for non-urgent symptoms. For details visit [email or web address].   Also download the MyChart app! Go to the app store, search "MyChart", open the app, select [facility], and log in with your MyChart username and password.  Due to Covid, a mask is required upon entering the hospital/clinic. If you do not have a mask, one will be given to you upon arrival. For doctor visits, patients may have 1 support Thomas Zavala aged 86 or older with them. For treatment visits, patients cannot have anyone with them due to current Covid guidelines and our immunocompromised population.

## 2022-01-16 ENCOUNTER — Ambulatory Visit (INDEPENDENT_AMBULATORY_CARE_PROVIDER_SITE_OTHER): Payer: PPO | Admitting: Podiatry

## 2022-01-16 ENCOUNTER — Encounter: Payer: Self-pay | Admitting: Podiatry

## 2022-01-16 DIAGNOSIS — B351 Tinea unguium: Secondary | ICD-10-CM | POA: Diagnosis not present

## 2022-01-16 DIAGNOSIS — M79675 Pain in left toe(s): Secondary | ICD-10-CM

## 2022-01-16 DIAGNOSIS — E119 Type 2 diabetes mellitus without complications: Secondary | ICD-10-CM

## 2022-01-16 DIAGNOSIS — M79674 Pain in right toe(s): Secondary | ICD-10-CM | POA: Diagnosis not present

## 2022-01-20 NOTE — Progress Notes (Signed)
  Subjective:  [name], male    DOB: [birth year],  MRN: [id number]  Bridget Westbrooks presents to clinic today for preventative diabetic foot care and painful elongated mycotic toenails 1-5 bilaterally which are tender when wearing enclosed shoe gear. Pain is relieved with periodic professional debridement.  New problem(s): None.   PCP is Corrington, Kip A, MD , and last visit was July 26, 2021.  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  Objective: No changes noted in today's physical examination. Edric Fetterman is a pleasant 88 y.o. male in NAD. AAO X 3.  Vascular Examination: CFT <3 seconds b/l LE. Palpable PT pulse(s) b/l LE. Faintly palpable DP pulses b/l LE. Pedal hair absent. No pain with calf compression RLE. No edema noted b/l LE. No cyanosis or clubbing noted b/l LE.  Dermatological Examination: Pedal skin thin and atrophic b/l LE. No open wounds b/l LE. No interdigital macerations noted b/l LE. Toenails 2-5 bilaterally and L hallux elongated, discolored, dystrophic, thickened, and crumbly with subungual debris and tenderness to dorsal palpation. Anonychia noted R hallux. Nailbed(s) epithelialized.   Musculoskeletal Examination: Muscle strength 5/5 to all lower extremity muscle groups bilaterally. No pain, crepitus or joint limitation noted with ROM bilateral LE. No gross bony deformities bilaterally.  Neurological Examination: Protective sensation intact 5/5 intact bilaterally with 10g monofilament b/l.  Assessment/Plan: 1. Pain due to onychomycosis of toenails of both feet   2. Type 2 diabetes mellitus without complication, without long-term current use of insulin ([place])     -Patient was evaluated  and treated. All patient's and/or POA's questions/concerns answered on today's visit. -Mycotic toenails 2-5 bilaterally and left great toe were debrided in length and girth with sterile nail nippers and dremel without iatrogenic bleeding by Registered Nurse. Patient was evaluated by me post-debridement. Patient satisfied with treatment. -Patient to continue soft, supportive shoe gear daily. -Patient/POA to call should there be question/concern in the interim.   Return in about 3 months (around 04/18/2022).  Marzetta Board, DPM

## 2022-01-29 ENCOUNTER — Other Ambulatory Visit: Payer: Self-pay

## 2022-01-29 DIAGNOSIS — C439 Malignant melanoma of skin, unspecified: Secondary | ICD-10-CM

## 2022-01-30 ENCOUNTER — Inpatient Hospital Stay: Payer: PPO

## 2022-01-30 ENCOUNTER — Other Ambulatory Visit: Payer: Self-pay | Admitting: Lab

## 2022-01-30 ENCOUNTER — Inpatient Hospital Stay: Payer: PPO | Attending: Hematology

## 2022-01-30 ENCOUNTER — Other Ambulatory Visit: Payer: Self-pay

## 2022-01-30 ENCOUNTER — Inpatient Hospital Stay (HOSPITAL_BASED_OUTPATIENT_CLINIC_OR_DEPARTMENT_OTHER): Payer: PPO | Admitting: Hematology

## 2022-01-30 VITALS — BP 133/53 | HR 74 | Temp 97.7°F | Resp 17 | Ht 71.0 in | Wt 176.3 lb

## 2022-01-30 DIAGNOSIS — C439 Malignant melanoma of skin, unspecified: Secondary | ICD-10-CM

## 2022-01-30 DIAGNOSIS — Z87891 Personal history of nicotine dependence: Secondary | ICD-10-CM | POA: Diagnosis not present

## 2022-01-30 DIAGNOSIS — C4339 Malignant melanoma of other parts of face: Secondary | ICD-10-CM | POA: Insufficient documentation

## 2022-01-30 DIAGNOSIS — C9110 Chronic lymphocytic leukemia of B-cell type not having achieved remission: Secondary | ICD-10-CM | POA: Insufficient documentation

## 2022-01-30 DIAGNOSIS — Z5112 Encounter for antineoplastic immunotherapy: Secondary | ICD-10-CM | POA: Diagnosis present

## 2022-01-30 DIAGNOSIS — C434 Malignant melanoma of scalp and neck: Secondary | ICD-10-CM | POA: Diagnosis not present

## 2022-01-30 LAB — CBC WITH DIFFERENTIAL (CANCER CENTER ONLY)
Abs Immature Granulocytes: 0.04 10*3/uL (ref 0.00–0.07)
Basophils Absolute: 0 10*3/uL (ref 0.0–0.1)
Basophils Relative: 0 %
Eosinophils Absolute: 0.3 10*3/uL (ref 0.0–0.5)
Eosinophils Relative: 1 %
HCT: 31.1 % — ABNORMAL LOW (ref 39.0–52.0)
Hemoglobin: 10.2 g/dL — ABNORMAL LOW (ref 13.0–17.0)
Immature Granulocytes: 0 %
Lymphocytes Relative: 90 %
Lymphs Abs: 54.5 10*3/uL — ABNORMAL HIGH (ref 0.7–4.0)
MCH: 32.5 pg (ref 26.0–34.0)
MCHC: 32.8 g/dL (ref 30.0–36.0)
MCV: 99 fL (ref 80.0–100.0)
Monocytes Absolute: 3.5 10*3/uL — ABNORMAL HIGH (ref 0.1–1.0)
Monocytes Relative: 6 %
Neutro Abs: 1.8 10*3/uL (ref 1.7–7.7)
Neutrophils Relative %: 3 %
Platelet Count: 84 10*3/uL — ABNORMAL LOW (ref 150–400)
RBC: 3.14 MIL/uL — ABNORMAL LOW (ref 4.22–5.81)
RDW: 15.5 % (ref 11.5–15.5)
Smear Review: NORMAL
WBC Count: 60.2 10*3/uL (ref 4.0–10.5)
nRBC: 0 % (ref 0.0–0.2)

## 2022-01-30 LAB — CMP (CANCER CENTER ONLY)
ALT: 12 U/L (ref 0–44)
AST: 19 U/L (ref 15–41)
Albumin: 4 g/dL (ref 3.5–5.0)
Alkaline Phosphatase: 86 U/L (ref 38–126)
Anion gap: 7 (ref 5–15)
BUN: 32 mg/dL — ABNORMAL HIGH (ref 8–23)
CO2: 27 mmol/L (ref 22–32)
Calcium: 9.3 mg/dL (ref 8.9–10.3)
Chloride: 103 mmol/L (ref 98–111)
Creatinine: 1.1 mg/dL (ref 0.61–1.24)
GFR, Estimated: >60 mL/min (ref >60)
Glucose, Bld: 110 mg/dL — ABNORMAL HIGH (ref 70–99)
Potassium: 3.9 mmol/L (ref 3.5–5.1)
Sodium: 137 mmol/L (ref 135–145)
Total Bilirubin: 0.5 mg/dL (ref 0.3–1.2)
Total Protein: 6.1 g/dL — ABNORMAL LOW (ref 6.5–8.1)

## 2022-01-30 LAB — LACTATE DEHYDROGENASE: LDH: 147 U/L (ref 98–192)

## 2022-01-30 MED ORDER — SODIUM CHLORIDE 0.9 % IV SOLN
200.0000 mg | Freq: Once | INTRAVENOUS | Status: AC
  Administered 2022-01-30: 200 mg via INTRAVENOUS
  Filled 2022-01-30: qty 200

## 2022-01-30 MED ORDER — SODIUM CHLORIDE 0.9 % IV SOLN: Freq: Once | INTRAVENOUS | Status: AC

## 2022-01-30 MED ORDER — SODIUM CHLORIDE 0.9 % IV SOLN: Freq: Once | INTRAVENOUS | Status: DC | PRN

## 2022-01-30 NOTE — Progress Notes (Signed)
HEMATOLOGY/ONCOLOGY CLINIC NOTE  Date of Service: 01/30/2022   Patient Care Team: Corrington, Delsa Grana, MD as PCP - General (Family Medicine)  CHIEF COMPLAINTS/PURPOSE OF CONSULTATION:  Toxicity check for newly started immunotherapy for metastatic melanoma.  HISTORY OF PRESENTING ILLNESS:  Please see previous note for details on initial presentation  INTERVAL HISTORY:   Thomas Zavala is a  88 y.o. male returns today accompanied by his daughter for continued evaluation and management of his metastatic melanoma and for toxicity assessment with his immunotherapy. He reports He is doing well with no new symptoms or concerns.  We discussed that if lesions and spots get more bothersome or get larger then we would consider a possible referral with radiation oncology.  He notes he is eating well.  Patient notes he tolerated his second dose of pembrolizumab without any notable toxicities.  No new lumps, bumps, or lesions/rashes. No abdominal pain or change in bowel habits. No other new or acute focal symptoms.  Labs done today were reviewed in detail.  MEDICAL HISTORY:  Past Medical History:  Diagnosis Date   Abdominal hernia    BPH (benign prostatic hyperplasia) 07/25/2016   CLL (chronic lymphocytic leukemia) (HCC)    Heart murmur    Hypertension    Melanoma of forehead ([place])    Melanoma of scalp ([place])    PVC's (premature ventricular contractions)    Type 2 diabetes mellitus without complication, without long-term current use of insulin ([place]) 07/25/2016    SURGICAL HISTORY: Past Surgical History:  Procedure Laterality Date   EYE SURGERY     eye lids   IRRIGATION AND DEBRIDEMENT OF WOUND WITH SPLIT THICKNESS SKIN GRAFT Left 01/05/2020   Procedure: SPLIT THICKNESS SKIN GRAFT Left shoulder;  Surgeon: Wallace Going, DO;  Location: [place];  Service: Plastics;  Laterality: Left;   MASS EXCISION N/A 01/05/2020   Procedure: EXCISION OF FOREHEAD  MELANOMA;  Surgeon:  Wallace Going, DO;  Location: [place];  Service: Plastics;  Laterality: N/A;   MELANOMA EXCISION  02/14/2021   Procedure: MELANOMA EXCISION of scalp;  Surgeon: Wallace Going, DO;  Location: [place];  Service: Plastics;;   PAROTIDECTOMY Left 01/05/2020   Procedure: PAROTIDECTOMY;  Surgeon: Izora Gala, MD;  Location: [place];  Service: ENT;  Laterality: Left;   PROSTATE SURGERY     SENTINEL NODE BIOPSY Left 01/05/2020   Procedure: Sentinel Node Biopsy;  Surgeon: Izora Gala, MD;  Location: [facility];  Service: ENT;  Laterality: Left;    SOCIAL HISTORY: Social History   Socioeconomic History   Marital status: Widowed    Spouse name: Not on file   Number of children: 3   Years of education: Not on file   Highest education level: Not on file  Occupational History   Not on file  Tobacco Use   Smoking status: Former    Packs/day: 1.00    Years: 25.00    Total pack years: 25.00    Types: Cigarettes    Quit date: 10    Years since quitting: 53.4   Smokeless tobacco: Never   Tobacco comments:    quit in the 70's  Vaping Use   Vaping Use: Never used  Substance and Sexual Activity   Alcohol use: Yes    Alcohol/week: 7.0 standard drinks of alcohol    Types: 7 Standard drinks or equivalent per week    Comment: socially.   Drug use: No   Sexual activity: Not on file  Other  Topics Concern   Not on file  Social History Narrative   Not on file   Social Determinants of Health   Financial Resource Strain: Not on file  Food Insecurity: Not on file  Transportation Needs: Not on file  Physical Activity: Not on file  Stress: Not on file  Social Connections: Not on file  Intimate Partner Violence: Not on file    FAMILY HISTORY: Family History  Problem Relation Age of Onset   Hypertension Father     ALLERGIES:  is allergic to tape and fluorouracil.  MEDICATIONS:  Current Outpatient Medications  Medication Sig Dispense Refill   acetaminophen (TYLENOL) 500 MG tablet Take  500-1,000 mg by mouth every 6 (six) hours as needed (for pain).     carboxymethylcellul-glycerin (REFRESH OPTIVE) 0.5-0.9 % ophthalmic solution Place 1 drop into both eyes 3 (three) times daily as needed for dry eyes.     Dextromethorphan-guaiFENesin (MUCINEX DM PO) Take 1 Dose by mouth at bedtime as needed (cough, sleep).     finasteride (PROSCAR) 5 MG tablet Take 5 mg by mouth daily.     hydrALAZINE (APRESOLINE) 25 MG tablet TAKE 1 TABLET BY MOUTH THREE TIMES A DAY 270 tablet 1   loratadine (CLARITIN) 10 MG tablet Take 10 mg by mouth daily as needed for allergies or rhinitis.     losartan-hydrochlorothiazide (HYZAAR) 100-25 MG tablet Take 1 tablet by mouth daily.      metFORMIN (GLUCOPHAGE-XR) 500 MG 24 hr tablet Take 500 mg by mouth daily with breakfast.     ondansetron (ZOFRAN) 8 MG tablet Take 1 tablet (8 mg total) by mouth 2 (two) times daily as needed (Nausea or vomiting). 30 tablet 1   PFIZER COVID-19 VAC BIVALENT injection      pindolol (VISKEN) 5 MG tablet TAKE 1 TABLET BY MOUTH TWICE A DAY 180 tablet 1   pindolol (VISKEN) 5 MG tablet Take by mouth.     pravastatin (PRAVACHOL) 40 MG tablet Take 40 mg by mouth daily.      No current facility-administered medications for this visit.    REVIEW OF SYSTEMS: 10 Point review of Systems was done is negative except as noted above.  PHYSICAL EXAMINATION: ECOG PERFORMANCE STATUS: 2 - Symptomatic, <50% confined to bed  . Vitals:   01/30/22 0901  BP: (!) 133/53  Pulse: 74  Resp: 17  Temp: 97.7 F (36.5 C)  SpO2: 99%    Filed Weights   01/30/22 0901  Weight: 176 lb 4.8 oz (80 kg)    Body mass index is 24.59 kg/m.  NAD GENERAL:alert, in no acute distress and comfortable SKIN: innumerable satellite lesions from melanoma on scalp and forehead EYES: conjunctiva are pink and non-injected, sclera anicteric NECK: supple, no JVD LYMPH: palpable cervical lymphadenopathy b/l primarily in left upper neck.Left>>Rt LUNGS: clear to  auscultation b/l with normal respiratory effort HEART: regular rate & rhythm ABDOMEN:  normoactive bowel sounds , non tender, not distended. Extremity: no pedal edema PSYCH: alert & oriented x 3 with fluent speech NEURO: no focal motor/sensory deficits  LABORATORY DATA:  I have reviewed the data as listed  .    Latest Ref Rng & Units 01/30/2022    8:47 AM 01/09/2022    7:28 AM 01/01/2022    8:44 AM  CBC  WBC 4.0 - 10.5 K/uL 60.2  47.1  45.7   Hemoglobin 13.0 - 17.0 g/dL 10.2  10.4  10.5   Hematocrit 39.0 - 52.0 % 31.1  32.4  32.7  Platelets 150 - 400 K/uL 84  91  106     .    Latest Ref Rng & Units 01/30/2022    8:47 AM 01/09/2022    7:28 AM 01/01/2022    8:44 AM  CMP  Glucose 70 - 99 mg/dL 110  155  127   BUN 8 - 23 mg/dL 32  28  24   Creatinine 0.61 - 1.24 mg/dL 1.10  1.17  1.18   Sodium 135 - 145 mmol/L 137  137  136   Potassium 3.5 - 5.1 mmol/L 3.9  3.9  3.7   Chloride 98 - 111 mmol/L 103  104  100   CO2 22 - 32 mmol/L '27  27  29   '$ Calcium 8.9 - 10.3 mg/dL 9.3  9.0  8.9   Total Protein 6.5 - 8.1 g/dL 6.1  5.9  5.9   Total Bilirubin 0.3 - 1.2 mg/dL 0.5  0.6  0.6   Alkaline Phos 38 - 126 U/L 86  88  93   AST 15 - 41 U/L '19  17  18   '$ ALT 0 - 44 U/L '12  10  13    '$ . Lab Results  Component Value Date   LDH 147 01/30/2022    07/15/16 Cytogenetics and Flow Cytometry:   11/12/19 of Molecular Pathology   01/05/20 of Surgical Pathology Report [id number])  RADIOGRAPHIC STUDIES: I have personally reviewed the radiological images as listed and agreed with the findings in the report. No results found.   ASSESSMENT & PLAN:  88 y.o. male with  1. Chronic Lymphocytic Leukemia Diagnosed in November 2017 with flow cytometry 07/15/16 Cytogenetics revealed Trisomy 12  09/03/16 CT A/P revealed Borderline splenomegaly with 1.3 cm low-attenuation lesion in the anterior aspect of spleen. Mild upper abdominal and right iliac lymphadenopathy. 4 mm indeterminate pulmonary  nodule in right lung base. Recommend continued attention on follow-up CT. Incidentally noted benign right hepatic lobe hemangioma, mildly and enlarged prostate, and colonic diverticulosis  10/10/17 CT Chest revealed Scattered small bilateral pulmonary nodules, including two dominant right middle lobe nodules measuring 5-6 mm, unchanged, likely benign. If clinically warranted, consider a single follow-up CT chest in 1 year. Mild mediastinal and bilateral hilar lymphadenopathy, likely related to the patient's known CLL. Mild splenomegaly, incompletely visualized. Aortic Atherosclerosis and Emphysema  2. 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  patient declined adjuvant immunotheraphy Now with multiple cutaneous metastases from melanoma over the scalp as well as left cervical lymph node metastasis based on PET scan.  On 12/20/2021.  MRI brain shows chronic meningioma but no evidence of metastatic melanoma to the brain.  PLAN: -Patient's labs done today were reviewed with him in detail. CBC stable with stable CLL. CMP stable.   LDH within normal limits. -Patient with no significant notable toxicities from his second cycle of pembrolizumab. We shall continue his pembrolizumab every 3 weeks until toxicity arises or there is overt disease progression from his melanoma. PET/CT after 6 cycles of treatment unless overt evidence of clinical progression. -No indication for treatment of CLL at this time. -We discussed that if lesions and spots get more bothersome or get larger then we would consider a possible referral with radiation oncology.  FOLLOW UP: Please schedule cycle 4 of pembrolizumab with labs and MD visit Please schedule cycle 5 of pembrolizumab with labs Please schedule cycle 6 of pembrolizumab with labs and MD visit.  The total  time spent in the appointment was 25 minutes*.  All of the patient's questions were answered with  apparent satisfaction. The patient knows to call the clinic with any problems, questions or concerns.   Sullivan Lone MD MS AAHIVMS [facility] Hematology/Oncology Physician [facility]  .*Total Encounter Time as defined by the Centers for Medicare and Medicaid Services includes, in addition to the face-to-face time of a patient visit (documented in the note above) non-face-to-face time: obtaining and reviewing outside history, ordering and reviewing medications, tests or procedures, care coordination (communications with other health care professionals or caregivers) and documentation in the medical record.  I, Melene Muller, am acting as scribe for Dr. Sullivan Lone, MD.  .I have reviewed the above documentation for accuracy and completeness, and I agree with the above. Brunetta Genera MD

## 2022-01-30 NOTE — Patient Instructions (Signed)
[facility] MEDICAL ONCOLOGY  Discharge Instructions: Thank you for choosing [facility] to provide your oncology and hematology care.   If you have a lab appointment with the Cancer Center, please go directly to the Cancer Center and check in at the registration area.   Wear comfortable clothing and clothing appropriate for easy access to any Portacath or PICC line.   We strive to give you quality time with your provider. You may need to reschedule your appointment if you arrive late (15 or more minutes).  Arriving late affects you and other patients whose appointments are after yours.  Also, if you miss three or more appointments without notifying the office, you may be dismissed from the clinic at the provider's discretion.      For prescription refill requests, have your pharmacy contact our office and allow 72 hours for refills to be completed.    Today you received the following chemotherapy and/or immunotherapy agents: Keytruda      To help prevent nausea and vomiting after your treatment, we encourage you to take your nausea medication as directed.  BELOW ARE SYMPTOMS THAT SHOULD BE REPORTED IMMEDIATELY: *FEVER GREATER THAN 100.4 F (38 C) OR HIGHER *CHILLS OR SWEATING *NAUSEA AND VOMITING THAT IS NOT CONTROLLED WITH YOUR NAUSEA MEDICATION *UNUSUAL SHORTNESS OF BREATH *UNUSUAL BRUISING OR BLEEDING *URINARY PROBLEMS (pain or burning when urinating, or frequent urination) *BOWEL PROBLEMS (unusual diarrhea, constipation, pain near the anus) TENDERNESS IN MOUTH AND THROAT WITH OR WITHOUT PRESENCE OF ULCERS (sore throat, sores in mouth, or a toothache) UNUSUAL RASH, SWELLING OR PAIN  UNUSUAL VAGINAL DISCHARGE OR ITCHING   Items with * indicate a potential emergency and should be followed up as soon as possible or go to the Emergency Department if any problems should occur.  Please show the CHEMOTHERAPY ALERT CARD or IMMUNOTHERAPY ALERT CARD at check-in to  the Emergency Department and triage nurse.  Should you have questions after your visit or need to cancel or reschedule your appointment, please contact [facility] MEDICAL ONCOLOGY  Dept: [phone]  and follow the prompts.  Office hours are 8:00 a.m. to 4:30 p.m. Monday - Friday. Please note that voicemails left after 4:00 p.m. may not be returned until the following business day.  We are closed weekends and major holidays. You have access to a nurse at all times for urgent questions. Please call the main number to the clinic Dept: [phone] and follow the prompts.   For any non-urgent questions, you may also contact your provider using MyChart. We now offer e-Visits for anyone 18 and older to request care online for non-urgent symptoms. For details visit [email or web address].   Also download the MyChart app! Go to the app store, search "MyChart", open the app, select [facility], and log in with your MyChart username and password.  Masks are optional in the cancer centers. If you would like for your care team to wear a mask while they are taking care of you, please let them know. For doctor visits, patients may have with them one support person who is at least 86 years old. At this time, visitors are not allowed in the infusion area. 

## 2022-01-30 NOTE — Progress Notes (Signed)
Per Dr Irene Limbo ok to treat with platelets of 84 today.

## 2022-02-03 ENCOUNTER — Encounter: Payer: Self-pay | Admitting: Hematology

## 2022-02-18 ENCOUNTER — Other Ambulatory Visit: Payer: Self-pay

## 2022-02-18 DIAGNOSIS — C439 Malignant melanoma of skin, unspecified: Secondary | ICD-10-CM

## 2022-02-20 ENCOUNTER — Other Ambulatory Visit: Payer: Self-pay

## 2022-02-20 ENCOUNTER — Encounter: Payer: Self-pay | Admitting: *Deleted

## 2022-02-20 ENCOUNTER — Inpatient Hospital Stay: Payer: PPO

## 2022-02-20 ENCOUNTER — Inpatient Hospital Stay: Payer: PPO | Attending: Hematology

## 2022-02-20 ENCOUNTER — Inpatient Hospital Stay (HOSPITAL_BASED_OUTPATIENT_CLINIC_OR_DEPARTMENT_OTHER): Payer: PPO | Admitting: Hematology

## 2022-02-20 VITALS — BP 129/59 | HR 70 | Temp 97.5°F | Resp 16 | Ht 71.0 in | Wt 179.2 lb

## 2022-02-20 DIAGNOSIS — Z87891 Personal history of nicotine dependence: Secondary | ICD-10-CM | POA: Diagnosis not present

## 2022-02-20 DIAGNOSIS — C439 Malignant melanoma of skin, unspecified: Secondary | ICD-10-CM | POA: Diagnosis not present

## 2022-02-20 DIAGNOSIS — C434 Malignant melanoma of scalp and neck: Secondary | ICD-10-CM | POA: Diagnosis not present

## 2022-02-20 DIAGNOSIS — C9110 Chronic lymphocytic leukemia of B-cell type not having achieved remission: Secondary | ICD-10-CM | POA: Diagnosis present

## 2022-02-20 DIAGNOSIS — Z5112 Encounter for antineoplastic immunotherapy: Secondary | ICD-10-CM | POA: Diagnosis present

## 2022-02-20 DIAGNOSIS — C4339 Malignant melanoma of other parts of face: Secondary | ICD-10-CM | POA: Insufficient documentation

## 2022-02-20 LAB — CBC WITH DIFFERENTIAL (CANCER CENTER ONLY)
Abs Immature Granulocytes: 0.09 10*3/uL — ABNORMAL HIGH (ref 0.00–0.07)
Basophils Absolute: 0 10*3/uL (ref 0.0–0.1)
Basophils Relative: 0 %
Eosinophils Absolute: 0.3 10*3/uL (ref 0.0–0.5)
Eosinophils Relative: 0 %
HCT: 31.1 % — ABNORMAL LOW (ref 39.0–52.0)
Hemoglobin: 10 g/dL — ABNORMAL LOW (ref 13.0–17.0)
Immature Granulocytes: 0 %
Lymphocytes Relative: 93 %
Lymphs Abs: 83.4 10*3/uL — ABNORMAL HIGH (ref 0.7–4.0)
MCH: 31.9 pg (ref 26.0–34.0)
MCHC: 32.2 g/dL (ref 30.0–36.0)
MCV: 99.4 fL (ref 80.0–100.0)
Monocytes Absolute: 4.3 10*3/uL — ABNORMAL HIGH (ref 0.1–1.0)
Monocytes Relative: 5 %
Neutro Abs: 2 10*3/uL (ref 1.7–7.7)
Neutrophils Relative %: 2 %
Platelet Count: 109 10*3/uL — ABNORMAL LOW (ref 150–400)
RBC: 3.13 MIL/uL — ABNORMAL LOW (ref 4.22–5.81)
RDW: 15.1 % (ref 11.5–15.5)
Smear Review: NORMAL
WBC Count: 90.1 10*3/uL (ref 4.0–10.5)
nRBC: 0 % (ref 0.0–0.2)

## 2022-02-20 LAB — CMP (CANCER CENTER ONLY)
ALT: 15 U/L (ref 0–44)
AST: 22 U/L (ref 15–41)
Albumin: 4.1 g/dL (ref 3.5–5.0)
Alkaline Phosphatase: 93 U/L (ref 38–126)
Anion gap: 8 (ref 5–15)
BUN: 24 mg/dL — ABNORMAL HIGH (ref 8–23)
CO2: 27 mmol/L (ref 22–32)
Calcium: 9.2 mg/dL (ref 8.9–10.3)
Chloride: 101 mmol/L (ref 98–111)
Creatinine: 1.2 mg/dL (ref 0.61–1.24)
GFR, Estimated: 57 mL/min — ABNORMAL LOW (ref >60)
Glucose, Bld: 157 mg/dL — ABNORMAL HIGH (ref 70–99)
Potassium: 3.9 mmol/L (ref 3.5–5.1)
Sodium: 136 mmol/L (ref 135–145)
Total Bilirubin: 0.5 mg/dL (ref 0.3–1.2)
Total Protein: 6.1 g/dL — ABNORMAL LOW (ref 6.5–8.1)

## 2022-02-20 LAB — LACTATE DEHYDROGENASE: LDH: 163 U/L (ref 98–192)

## 2022-02-20 MED ORDER — SODIUM CHLORIDE 0.9 % IV SOLN: Freq: Once | INTRAVENOUS | Status: AC

## 2022-02-20 MED ORDER — SODIUM CHLORIDE 0.9 % IV SOLN
200.0000 mg | Freq: Once | INTRAVENOUS | Status: AC
  Administered 2022-02-20: 200 mg via INTRAVENOUS
  Filled 2022-02-20: qty 200

## 2022-02-20 NOTE — Progress Notes (Signed)
MD saw pt today and saw all lab results from today. Pt is ok for tx today per MD.

## 2022-02-20 NOTE — Progress Notes (Signed)
HEMATOLOGY/ONCOLOGY CLINIC NOTE  Date of Service: 02/20/2022   Patient Care Team: Corrington, Delsa Grana, MD as PCP - General (Family Medicine)  CHIEF COMPLAINTS/PURPOSE OF CONSULTATION:  Continued management of metastatic melanoma  HISTORY OF PRESENTING ILLNESS:  Please see previous note for details on initial presentation  INTERVAL HISTORY:  Thomas Zavala is a  88 y.o. male returns today accompanied by his daughter for continued evaluation and management of his metastatic melanoma and for toxicity assessment with his immunotherapy. He reports He is doing well with no new symptoms or concerns.  We discussed possible treatment options for further management. He has noted that he would like to avoid going to [facility] or pursue any other treatment outside of the clinic at this time. We discussed intensifying current immunotherapy by adding Ipilumimab and adding additional therapy which he is agreeable to.  We discussed that we could consider a possible referral with radiation oncology to look at the spots on the neck and scalp. He was agreeable to this.  Patient notes he tolerated his second dose of pembrolizumab without any notable toxicities.  No new lumps, bumps, or lesions/rashes. No abdominal pain or change in bowel habits. No diarrhea. No headaches, no changes in speech or vision. No other new or acute focal symptoms.  Labs done today were reviewed in detail.  MEDICAL HISTORY:  Past Medical History:  Diagnosis Date   Abdominal hernia    BPH (benign prostatic hyperplasia) 07/25/2016   CLL (chronic lymphocytic leukemia) (HCC)    Heart murmur    Hypertension    Melanoma of forehead ([place])    Melanoma of scalp ([place])    PVC's (premature ventricular contractions)    Type 2 diabetes mellitus without complication, without long-term current use of insulin ([place]) 07/25/2016    SURGICAL HISTORY: Past Surgical History:  Procedure Laterality Date   EYE SURGERY     eye lids    IRRIGATION AND DEBRIDEMENT OF WOUND WITH SPLIT THICKNESS SKIN GRAFT Left 01/05/2020   Procedure: SPLIT THICKNESS SKIN GRAFT Left shoulder;  Surgeon: Wallace Going, DO;  Location: [place];  Service: Plastics;  Laterality: Left;   MASS EXCISION N/A 01/05/2020   Procedure: EXCISION OF FOREHEAD  MELANOMA;  Surgeon: Wallace Going, DO;  Location: [place];  Service: Plastics;  Laterality: N/A;   MELANOMA EXCISION  02/14/2021   Procedure: MELANOMA EXCISION of scalp;  Surgeon: Wallace Going, DO;  Location: [place];  Service: Plastics;;   PAROTIDECTOMY Left 01/05/2020   Procedure: PAROTIDECTOMY;  Surgeon: Izora Gala, MD;  Location: [place];  Service: ENT;  Laterality: Left;   PROSTATE SURGERY     SENTINEL NODE BIOPSY Left 01/05/2020   Procedure: Sentinel Node Biopsy;  Surgeon: Izora Gala, MD;  [facility];  Service: ENT;  Laterality: Left;    SOCIAL HISTORY: Social History   Socioeconomic History   Marital status: Widowed    Spouse name: Not on file   Number of children: 3   Years of education: Not on file   Highest education level: Not on file  Occupational History   Not on file  Tobacco Use   Smoking status: Former    Packs/day: 1.00    Years: 25.00    Total pack years: 25.00    Types: Cigarettes    Quit date: 68    Years since quitting: 53.5   Smokeless tobacco: Never   Tobacco comments:    quit in the 70's  Vaping Use   Vaping  Use: Never used  Substance and Sexual Activity   Alcohol use: Yes    Alcohol/week: 7.0 standard drinks of alcohol    Types: 7 Standard drinks or equivalent per week    Comment: socially.   Drug use: No   Sexual activity: Not on file  Other Topics Concern   Not on file  Social History Narrative   Not on file   Social Determinants of Health   Financial Resource Strain: Not on file  Food Insecurity: Not on file  Transportation Needs: Not on file  Physical Activity: Not on file  Stress: Not on file  Social Connections: Not on file   Intimate Partner Violence: Not on file    FAMILY HISTORY: Family History  Problem Relation Age of Onset   Hypertension Father     ALLERGIES:  is allergic to tape and fluorouracil.  MEDICATIONS:  Current Outpatient Medications  Medication Sig Dispense Refill   acetaminophen (TYLENOL) 500 MG tablet Take 500-1,000 mg by mouth every 6 (six) hours as needed (for pain).     carboxymethylcellul-glycerin (REFRESH OPTIVE) 0.5-0.9 % ophthalmic solution Place 1 drop into both eyes 3 (three) times daily as needed for dry eyes.     Dextromethorphan-guaiFENesin (MUCINEX DM PO) Take 1 Dose by mouth at bedtime as needed (cough, sleep).     finasteride (PROSCAR) 5 MG tablet Take 5 mg by mouth daily.     hydrALAZINE (APRESOLINE) 25 MG tablet TAKE 1 TABLET BY MOUTH THREE TIMES A DAY 270 tablet 1   loratadine (CLARITIN) 10 MG tablet Take 10 mg by mouth daily as needed for allergies or rhinitis.     losartan-hydrochlorothiazide (HYZAAR) 100-25 MG tablet Take 1 tablet by mouth daily.      metFORMIN (GLUCOPHAGE-XR) 500 MG 24 hr tablet Take 500 mg by mouth daily with breakfast.     ondansetron (ZOFRAN) 8 MG tablet Take 1 tablet (8 mg total) by mouth 2 (two) times daily as needed (Nausea or vomiting). 30 tablet 1   PFIZER COVID-19 VAC BIVALENT injection      pindolol (VISKEN) 5 MG tablet TAKE 1 TABLET BY MOUTH TWICE A DAY 180 tablet 1   pindolol (VISKEN) 5 MG tablet Take by mouth.     pravastatin (PRAVACHOL) 40 MG tablet Take 40 mg by mouth daily.      No current facility-administered medications for this visit.    REVIEW OF SYSTEMS: 10 Point review of Systems was done is negative except as noted above.  PHYSICAL EXAMINATION: ECOG PERFORMANCE STATUS: 2 - Symptomatic, <50% confined to bed  . Vitals:   02/20/22 0853  BP: (!) 129/59  Pulse: 70  Resp: 16  Temp: (!) 97.5 F (36.4 C)  SpO2: 99%    Filed Weights   02/20/22 0853  Weight: 179 lb 3.2 oz (81.3 kg)    Body mass index is 24.99 kg/m.   NAD GENERAL:alert, in no acute distress and comfortable SKIN: no acute rashes, no significant lesions EYES: conjunctiva are pink and non-injected, sclera anicteric NECK: supple, no JVD LYMPH:  no palpable lymphadenopathy in the cervical, axillary or inguinal regions LUNGS: clear to auscultation b/l with normal respiratory effort HEART: regular rate & rhythm ABDOMEN:  normoactive bowel sounds , non tender, not distended. Extremity: no pedal edema PSYCH: alert & oriented x 3 with fluent speech NEURO: no focal motor/sensory deficits  LABORATORY DATA:  I have reviewed the data as listed  .    Latest Ref Rng & Units 02/20/2022  8:34 AM 01/30/2022    8:47 AM 01/09/2022    7:28 AM  CBC  WBC 4.0 - 10.5 K/uL 90.1  60.2  47.1   Hemoglobin 13.0 - 17.0 g/dL 10.0  10.2  10.4   Hematocrit 39.0 - 52.0 % 31.1  31.1  32.4   Platelets 150 - 400 K/uL 109  84  91     .    Latest Ref Rng & Units 02/20/2022    8:34 AM 01/30/2022    8:47 AM 01/09/2022    7:28 AM  CMP  Glucose 70 - 99 mg/dL 157  110  155   BUN 8 - 23 mg/dL 24  32  28   Creatinine 0.61 - 1.24 mg/dL 1.20  1.10  1.17   Sodium 135 - 145 mmol/L 136  137  137   Potassium 3.5 - 5.1 mmol/L 3.9  3.9  3.9   Chloride 98 - 111 mmol/L 101  103  104   CO2 22 - 32 mmol/L '27  27  27   '$ Calcium 8.9 - 10.3 mg/dL 9.2  9.3  9.0   Total Protein 6.5 - 8.1 g/dL 6.1  6.1  5.9   Total Bilirubin 0.3 - 1.2 mg/dL 0.5  0.5  0.6   Alkaline Phos 38 - 126 U/L 93  86  88   AST 15 - 41 U/L '22  19  17   '$ ALT 0 - 44 U/L '15  12  10    '$ . Lab Results  Component Value Date   LDH 147 01/30/2022    07/15/16 Cytogenetics and Flow Cytometry:   11/12/19 of Molecular Pathology   01/05/20 of Surgical Pathology Report [id number])  RADIOGRAPHIC STUDIES: I have personally reviewed the radiological images as listed and agreed with the findings in the report. No results found.   ASSESSMENT & PLAN:  88 y.o. male with  1. Chronic Lymphocytic  Leukemia Diagnosed in November 2017 with flow cytometry 07/15/16 Cytogenetics revealed Trisomy 12  09/03/16 CT A/P revealed Borderline splenomegaly with 1.3 cm low-attenuation lesion in the anterior aspect of spleen. Mild upper abdominal and right iliac lymphadenopathy. 4 mm indeterminate pulmonary nodule in right lung base. Recommend continued attention on follow-up CT. Incidentally noted benign right hepatic lobe hemangioma, mildly and enlarged prostate, and colonic diverticulosis  10/10/17 CT Chest revealed Scattered small bilateral pulmonary nodules, including two dominant right middle lobe nodules measuring 5-6 mm, unchanged, likely benign. If clinically warranted, consider a single follow-up CT chest in 1 year. Mild mediastinal and bilateral hilar lymphadenopathy, likely related to the patient's known CLL. Mild splenomegaly, incompletely visualized. Aortic Atherosclerosis and Emphysema  2. 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  patient declined adjuvant immunotheraphy Now with multiple cutaneous metastases from melanoma over the scalp as well as left cervical lymph node metastasis based on PET scan.  On 12/20/2021.  MRI brain shows chronic meningioma but no evidence of metastatic melanoma to the brain.  PLAN: -Patient's labs done today were reviewed with him in detail. CBC shows elevated WBC count of 90.1k, platelet count somewhat improved at 109k, and counts otherwise stable. Stable CLL. CMP stable.   LDH within normal limits. -Patient with no significant notable toxicities from his 3rd cycle of pembrolizumab. We shall continue his pembrolizumab every 3 weeks until toxicity arises or there is overt disease progression from his melanoma. PET/CT after 6 cycles of treatment unless overt evidence of clinical progression. -No  indication for treatment of CLL at this time. -Refer to radiation oncology for consideration of palliative  RT to neck and scalp lesions. This sometimes improves systemic response to immunotherapy as well by allowing for better neo-epitopic discovery. -We will subsequently consider intensification of current immunotherapy by adding Ipilumimab if needed down the line. -no indication to initiate treatment for CLL at this time.  FOLLOW UP: -Referral to radiation oncology for consideration of palliative radiation for metastatic melanoma to the left neck and multiple lesions on the scalp -Continue pembrolizumab immunotherapy every 3 weeks with labs and MD visit  The total time spent in the appointment was 32 minutes*.  All of the patient's questions were answered with apparent satisfaction. The patient knows to call the clinic with any problems, questions or concerns.   Sullivan Lone MD MS AAHIVMS [facility] Hematology/Oncology Physician [facility]  .*Total Encounter Time as defined by the Centers for Medicare and Medicaid Services includes, in addition to the face-to-face time of a patient visit (documented in the note above) non-face-to-face time: obtaining and reviewing outside history, ordering and reviewing medications, tests or procedures, care coordination (communications with other health care professionals or caregivers) and documentation in the medical record.  I, Melene Muller, am acting as scribe for Dr. Sullivan Lone, MD.  .I have reviewed the above documentation for accuracy and completeness, and I agree with the above. Brunetta Genera MD

## 2022-02-20 NOTE — Patient Instructions (Signed)
[facility] MEDICAL ONCOLOGY  Discharge Instructions: Thank you for choosing [facility] to provide your oncology and hematology care.   If you have a lab appointment with the Cancer Center, please go directly to the Cancer Center and check in at the registration area.   Wear comfortable clothing and clothing appropriate for easy access to any Portacath or PICC line.   We strive to give you quality time with your provider. You may need to reschedule your appointment if you arrive late (15 or more minutes).  Arriving late affects you and other patients whose appointments are after yours.  Also, if you miss three or more appointments without notifying the office, you may be dismissed from the clinic at the provider's discretion.      For prescription refill requests, have your pharmacy contact our office and allow 72 hours for refills to be completed.    Today you received the following chemotherapy and/or immunotherapy agent: Keytruda      To help prevent nausea and vomiting after your treatment, we encourage you to take your nausea medication as directed.  BELOW ARE SYMPTOMS THAT SHOULD BE REPORTED IMMEDIATELY: *FEVER GREATER THAN 100.4 F (38 C) OR HIGHER *CHILLS OR SWEATING *NAUSEA AND VOMITING THAT IS NOT CONTROLLED WITH YOUR NAUSEA MEDICATION *UNUSUAL SHORTNESS OF BREATH *UNUSUAL BRUISING OR BLEEDING *URINARY PROBLEMS (pain or burning when urinating, or frequent urination) *BOWEL PROBLEMS (unusual diarrhea, constipation, pain near the anus) TENDERNESS IN MOUTH AND THROAT WITH OR WITHOUT PRESENCE OF ULCERS (sore throat, sores in mouth, or a toothache) UNUSUAL RASH, SWELLING OR PAIN  UNUSUAL VAGINAL DISCHARGE OR ITCHING   Items with * indicate a potential emergency and should be followed up as soon as possible or go to the Emergency Department if any problems should occur.  Please show the CHEMOTHERAPY ALERT CARD or IMMUNOTHERAPY ALERT CARD at check-in to  the Emergency Department and triage nurse.  Should you have questions after your visit or need to cancel or reschedule your appointment, please contact [facility] MEDICAL ONCOLOGY  Dept: [phone]  and follow the prompts.  Office hours are 8:00 a.m. to 4:30 p.m. Monday - Friday. Please note that voicemails left after 4:00 p.m. may not be returned until the following business day.  We are closed weekends and major holidays. You have access to a nurse at all times for urgent questions. Please call the main number to the clinic Dept: [phone] and follow the prompts.   For any non-urgent questions, you may also contact your provider using MyChart. We now offer e-Visits for anyone 18 and older to request care online for non-urgent symptoms. For details visit [email or web address].   Also download the MyChart app! Go to the app store, search "MyChart", open the app, select [facility], and log in with your MyChart username and password.  Masks are optional in the cancer centers. If you would like for your care team to wear a mask while they are taking care of you, please let them know. For doctor visits, patients may have with them one support person who is at least 86 years old. At this time, visitors are not allowed in the infusion area. 

## 2022-02-20 NOTE — Progress Notes (Signed)
CRITICAL VALUE STICKER  CRITICAL VALUE: WBC 90.1  RECEIVER (on-site recipient of call):Sharlynn Oliphant, RN  MD NOTIFIED: Dr Irene Limbo  RESPONSE: Dr Irene Limbo to see patient in office

## 2022-02-22 ENCOUNTER — Telehealth: Payer: Self-pay | Admitting: Hematology

## 2022-02-22 ENCOUNTER — Telehealth: Payer: Self-pay

## 2022-02-22 NOTE — Telephone Encounter (Signed)
Scheduled follow-up appointment per 7/5 los. Patient is aware.
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
## 2022-02-22 NOTE — Telephone Encounter (Signed)
T/C from pt's daughter, Lanelle Bal, with questions regarding the [place] appt on 7/11.  Daughter advised Dr Irene Limbo is out of the office but recommend keeping the appt and review questions and concerns with radiation Dr.  She was in agreement with this plan.
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
## 2022-02-25 NOTE — Progress Notes (Incomplete)
Histology and Location of Primary Cancer: Metastatic melanoma  Location(s) of Symptomatic tumor(s): left neck and multiple lesions on the scalp  Past/Anticipated chemotherapy by medical oncology, if any: pembrolizumab immunotherapy every 3 weeks   Patient's main complaints related to symptomatic tumor(s) are: the areas on his scalp and left side of his face/neck bleed. This especially bothers him at night.  Pain on a scale of 0-10 is: 0    SAFETY ISSUES: Prior radiation? no Pacemaker/ICD? no Possible current pregnancy? no Is the patient on methotrexate? no  Additional Complaints / other details:  Patient is here with his daughter.

## 2022-02-26 ENCOUNTER — Encounter: Payer: Self-pay | Admitting: Radiation Oncology

## 2022-02-26 ENCOUNTER — Ambulatory Visit
Admission: RE | Admit: 2022-02-26 | Discharge: 2022-02-26 | Disposition: A | Discharge status: Home / Self Care | Payer: PPO | Source: Ambulatory Visit | Attending: Radiation Oncology | Admitting: Radiation Oncology

## 2022-02-26 ENCOUNTER — Other Ambulatory Visit: Payer: Self-pay

## 2022-02-26 ENCOUNTER — Encounter: Payer: Self-pay | Admitting: Hematology

## 2022-02-26 VITALS — BP 121/50 | HR 68 | Temp 97.5°F | Wt 178.8 lb

## 2022-02-26 DIAGNOSIS — C9110 Chronic lymphocytic leukemia of B-cell type not having achieved remission: Secondary | ICD-10-CM | POA: Diagnosis not present

## 2022-02-26 DIAGNOSIS — E119 Type 2 diabetes mellitus without complications: Secondary | ICD-10-CM | POA: Insufficient documentation

## 2022-02-26 DIAGNOSIS — C439 Malignant melanoma of skin, unspecified: Secondary | ICD-10-CM | POA: Insufficient documentation

## 2022-02-26 DIAGNOSIS — C7989 Secondary malignant neoplasm of other specified sites: Secondary | ICD-10-CM | POA: Insufficient documentation

## 2022-02-26 DIAGNOSIS — I10 Essential (primary) hypertension: Secondary | ICD-10-CM | POA: Diagnosis not present

## 2022-02-26 DIAGNOSIS — K449 Diaphragmatic hernia without obstruction or gangrene: Secondary | ICD-10-CM | POA: Insufficient documentation

## 2022-02-26 DIAGNOSIS — Z79899 Other long term (current) drug therapy: Secondary | ICD-10-CM | POA: Insufficient documentation

## 2022-02-26 DIAGNOSIS — N400 Enlarged prostate without lower urinary tract symptoms: Secondary | ICD-10-CM | POA: Insufficient documentation

## 2022-02-26 DIAGNOSIS — Z87891 Personal history of nicotine dependence: Secondary | ICD-10-CM | POA: Diagnosis not present

## 2022-02-26 DIAGNOSIS — Z7984 Long term (current) use of oral hypoglycemic drugs: Secondary | ICD-10-CM | POA: Insufficient documentation

## 2022-02-27 ENCOUNTER — Ambulatory Visit: Payer: PPO | Admitting: Student

## 2022-02-28 ENCOUNTER — Other Ambulatory Visit: Payer: Self-pay | Admitting: Student

## 2022-02-28 ENCOUNTER — Other Ambulatory Visit: Payer: Self-pay

## 2022-02-28 ENCOUNTER — Ambulatory Visit
Admission: RE | Admit: 2022-02-28 | Discharge: 2022-02-28 | Disposition: A | Discharge status: Home / Self Care | Payer: PPO | Source: Ambulatory Visit | Attending: Radiation Oncology | Admitting: Radiation Oncology

## 2022-02-28 DIAGNOSIS — Z87891 Personal history of nicotine dependence: Secondary | ICD-10-CM | POA: Insufficient documentation

## 2022-02-28 DIAGNOSIS — C434 Malignant melanoma of scalp and neck: Secondary | ICD-10-CM | POA: Diagnosis present

## 2022-02-28 DIAGNOSIS — C9110 Chronic lymphocytic leukemia of B-cell type not having achieved remission: Secondary | ICD-10-CM | POA: Insufficient documentation

## 2022-02-28 DIAGNOSIS — Z5112 Encounter for antineoplastic immunotherapy: Secondary | ICD-10-CM | POA: Insufficient documentation

## 2022-02-28 DIAGNOSIS — C4339 Malignant melanoma of other parts of face: Secondary | ICD-10-CM | POA: Diagnosis present

## 2022-03-04 ENCOUNTER — Ambulatory Visit: Payer: PPO | Admitting: Radiation Oncology

## 2022-03-04 ENCOUNTER — Encounter: Payer: Self-pay | Admitting: Student

## 2022-03-04 ENCOUNTER — Ambulatory Visit: Payer: PPO | Admitting: Student

## 2022-03-04 VITALS — BP 126/57 | HR 73 | Temp 98.4°F | Resp 17 | Ht 71.0 in | Wt 179.4 lb

## 2022-03-04 DIAGNOSIS — I491 Atrial premature depolarization: Secondary | ICD-10-CM

## 2022-03-04 DIAGNOSIS — I493 Ventricular premature depolarization: Secondary | ICD-10-CM

## 2022-03-04 DIAGNOSIS — I441 Atrioventricular block, second degree: Secondary | ICD-10-CM

## 2022-03-05 DIAGNOSIS — Z5112 Encounter for antineoplastic immunotherapy: Secondary | ICD-10-CM | POA: Diagnosis not present

## 2022-03-06 ENCOUNTER — Other Ambulatory Visit: Payer: Self-pay

## 2022-03-06 ENCOUNTER — Ambulatory Visit: Payer: PPO | Admitting: Radiation Oncology

## 2022-03-06 ENCOUNTER — Ambulatory Visit
Admission: RE | Admit: 2022-03-06 | Discharge: 2022-03-06 | Disposition: A | Discharge status: Home / Self Care | Payer: PPO | Source: Ambulatory Visit | Attending: Radiation Oncology | Admitting: Radiation Oncology

## 2022-03-06 DIAGNOSIS — Z5112 Encounter for antineoplastic immunotherapy: Secondary | ICD-10-CM | POA: Diagnosis not present

## 2022-03-06 LAB — RAD ONC ARIA SESSION SUMMARY
Course Elapsed Days: 0
Plan Fractions Treated to Date: 1
Plan Prescribed Dose Per Fraction: 3 Gy
Plan Total Fractions Prescribed: 9
Plan Total Prescribed Dose: 27 Gy
Reference Point Dosage Given to Date: 3 Gy
Reference Point Session Dosage Given: 3 Gy
Session Number: 1

## 2022-03-08 ENCOUNTER — Ambulatory Visit
Admission: RE | Admit: 2022-03-08 | Discharge: 2022-03-08 | Disposition: A | Discharge status: Home / Self Care | Payer: PPO | Source: Ambulatory Visit | Attending: Radiation Oncology | Admitting: Radiation Oncology

## 2022-03-08 ENCOUNTER — Other Ambulatory Visit: Payer: Self-pay

## 2022-03-08 DIAGNOSIS — Z5112 Encounter for antineoplastic immunotherapy: Secondary | ICD-10-CM | POA: Diagnosis not present

## 2022-03-08 LAB — RAD ONC ARIA SESSION SUMMARY
Course Elapsed Days: 2
Plan Fractions Treated to Date: 2
Plan Prescribed Dose Per Fraction: 3 Gy
Plan Total Fractions Prescribed: 9
Plan Total Prescribed Dose: 27 Gy
Reference Point Dosage Given to Date: 6 Gy
Reference Point Session Dosage Given: 3 Gy
Session Number: 2

## 2022-03-11 ENCOUNTER — Other Ambulatory Visit: Payer: Self-pay

## 2022-03-11 ENCOUNTER — Ambulatory Visit
Admission: RE | Admit: 2022-03-11 | Discharge: 2022-03-11 | Disposition: A | Discharge status: Home / Self Care | Payer: PPO | Source: Ambulatory Visit | Attending: Radiation Oncology | Admitting: Radiation Oncology

## 2022-03-11 DIAGNOSIS — Z5112 Encounter for antineoplastic immunotherapy: Secondary | ICD-10-CM | POA: Diagnosis not present

## 2022-03-11 LAB — RAD ONC ARIA SESSION SUMMARY
Course Elapsed Days: 5
Plan Fractions Treated to Date: 3
Plan Prescribed Dose Per Fraction: 3 Gy
Plan Total Fractions Prescribed: 9
Plan Total Prescribed Dose: 27 Gy
Reference Point Dosage Given to Date: 9 Gy
Reference Point Session Dosage Given: 3 Gy
Session Number: 3

## 2022-03-12 ENCOUNTER — Other Ambulatory Visit: Payer: Self-pay

## 2022-03-12 DIAGNOSIS — C439 Malignant melanoma of skin, unspecified: Secondary | ICD-10-CM

## 2022-03-13 ENCOUNTER — Ambulatory Visit
Admission: RE | Admit: 2022-03-13 | Discharge: 2022-03-13 | Disposition: A | Discharge status: Home / Self Care | Payer: PPO | Source: Ambulatory Visit | Attending: Radiation Oncology | Admitting: Radiation Oncology

## 2022-03-13 ENCOUNTER — Inpatient Hospital Stay: Payer: PPO

## 2022-03-13 ENCOUNTER — Other Ambulatory Visit: Payer: Self-pay

## 2022-03-13 VITALS — BP 136/61 | HR 62 | Temp 97.7°F | Resp 15 | Ht 71.0 in | Wt 178.0 lb

## 2022-03-13 DIAGNOSIS — C439 Malignant melanoma of skin, unspecified: Secondary | ICD-10-CM

## 2022-03-13 DIAGNOSIS — Z5112 Encounter for antineoplastic immunotherapy: Secondary | ICD-10-CM | POA: Diagnosis not present

## 2022-03-13 DIAGNOSIS — C434 Malignant melanoma of scalp and neck: Secondary | ICD-10-CM

## 2022-03-13 LAB — LACTATE DEHYDROGENASE: LDH: 185 U/L (ref 98–192)

## 2022-03-13 LAB — RAD ONC ARIA SESSION SUMMARY
Course Elapsed Days: 7
Plan Fractions Treated to Date: 4
Plan Prescribed Dose Per Fraction: 3 Gy
Plan Total Fractions Prescribed: 9
Plan Total Prescribed Dose: 27 Gy
Reference Point Dosage Given to Date: 12 Gy
Reference Point Session Dosage Given: 3 Gy
Session Number: 4

## 2022-03-13 LAB — CBC WITH DIFFERENTIAL (CANCER CENTER ONLY)
Abs Immature Granulocytes: 0.05 10*3/uL (ref 0.00–0.07)
Basophils Absolute: 0.1 10*3/uL (ref 0.0–0.1)
Basophils Relative: 0 %
Eosinophils Absolute: 0.2 10*3/uL (ref 0.0–0.5)
Eosinophils Relative: 0 %
HCT: 31.7 % — ABNORMAL LOW (ref 39.0–52.0)
Hemoglobin: 10.1 g/dL — ABNORMAL LOW (ref 13.0–17.0)
Immature Granulocytes: 0 %
Lymphocytes Relative: 91 %
Lymphs Abs: 63.9 10*3/uL — ABNORMAL HIGH (ref 0.7–4.0)
MCH: 31.7 pg (ref 26.0–34.0)
MCHC: 31.9 g/dL (ref 30.0–36.0)
MCV: 99.4 fL (ref 80.0–100.0)
Monocytes Absolute: 4.5 10*3/uL — ABNORMAL HIGH (ref 0.1–1.0)
Monocytes Relative: 6 %
Neutro Abs: 1.8 10*3/uL (ref 1.7–7.7)
Neutrophils Relative %: 3 %
Platelet Count: 94 10*3/uL — ABNORMAL LOW (ref 150–400)
RBC: 3.19 MIL/uL — ABNORMAL LOW (ref 4.22–5.81)
RDW: 14.8 % (ref 11.5–15.5)
Smear Review: NORMAL
WBC Count: 70.5 10*3/uL (ref 4.0–10.5)
nRBC: 0 % (ref 0.0–0.2)

## 2022-03-13 LAB — CMP (CANCER CENTER ONLY)
ALT: 11 U/L (ref 0–44)
AST: 19 U/L (ref 15–41)
Albumin: 4 g/dL (ref 3.5–5.0)
Alkaline Phosphatase: 85 U/L (ref 38–126)
Anion gap: 6 (ref 5–15)
BUN: 24 mg/dL — ABNORMAL HIGH (ref 8–23)
CO2: 28 mmol/L (ref 22–32)
Calcium: 8.9 mg/dL (ref 8.9–10.3)
Chloride: 104 mmol/L (ref 98–111)
Creatinine: 1.12 mg/dL (ref 0.61–1.24)
GFR, Estimated: >60 mL/min (ref >60)
Glucose, Bld: 149 mg/dL — ABNORMAL HIGH (ref 70–99)
Potassium: 3.8 mmol/L (ref 3.5–5.1)
Sodium: 138 mmol/L (ref 135–145)
Total Bilirubin: 0.4 mg/dL (ref 0.3–1.2)
Total Protein: 6.1 g/dL — ABNORMAL LOW (ref 6.5–8.1)

## 2022-03-13 MED ORDER — SODIUM CHLORIDE 0.9 % IV SOLN
200.0000 mg | Freq: Once | INTRAVENOUS | Status: AC
  Administered 2022-03-13: 200 mg via INTRAVENOUS
  Filled 2022-03-13: qty 200

## 2022-03-13 MED ORDER — SODIUM CHLORIDE 0.9 % IV SOLN: Freq: Once | INTRAVENOUS | Status: AC

## 2022-03-13 NOTE — Patient Instructions (Signed)
[facility]  Discharge Instructions: Thank you for choosing [facility] to provide your oncology and hematology care.   If you have a lab appointment with the Cancer Center, please go directly to the Cancer Center and check in at the registration area.   Wear comfortable clothing and clothing appropriate for easy access to any Portacath or PICC line.   We strive to give you quality time with your provider. You may need to reschedule your appointment if you arrive late (15 or more minutes).  Arriving late affects you and other patients whose appointments are after yours.  Also, if you miss three or more appointments without notifying the office, you may be dismissed from the clinic at the provider's discretion.      For prescription refill requests, have your pharmacy contact our office and allow 72 hours for refills to be completed.    Today you received the following chemotherapy and/or immunotherapy agents: keytruda      To help prevent nausea and vomiting after your treatment, we encourage you to take your nausea medication as directed.  BELOW ARE SYMPTOMS THAT SHOULD BE REPORTED IMMEDIATELY: *FEVER GREATER THAN 100.4 F (38 C) OR HIGHER *CHILLS OR SWEATING *NAUSEA AND VOMITING THAT IS NOT CONTROLLED WITH YOUR NAUSEA MEDICATION *UNUSUAL SHORTNESS OF BREATH *UNUSUAL BRUISING OR BLEEDING *URINARY PROBLEMS (pain or burning when urinating, or frequent urination) *BOWEL PROBLEMS (unusual diarrhea, constipation, pain near the anus) TENDERNESS IN MOUTH AND THROAT WITH OR WITHOUT PRESENCE OF ULCERS (sore throat, sores in mouth, or a toothache) UNUSUAL RASH, SWELLING OR PAIN  UNUSUAL VAGINAL DISCHARGE OR ITCHING   Items with * indicate a potential emergency and should be followed up as soon as possible or go to the Emergency Department if any problems should occur.  Please show the CHEMOTHERAPY ALERT CARD or IMMUNOTHERAPY ALERT CARD at check-in to  the Emergency Department and triage nurse.  Should you have questions after your visit or need to cancel or reschedule your appointment, please contact [facility]  Dept: [phone]  and follow the prompts.  Office hours are 8:00 a.m. to 4:30 p.m. Monday - Friday. Please note that voicemails left after 4:00 p.m. may not be returned until the following business day.  We are closed weekends and major holidays. You have access to a nurse at all times for urgent questions. Please call the main number to the clinic Dept: [phone] and follow the prompts.   For any non-urgent questions, you may also contact your provider using MyChart. We now offer e-Visits for anyone 18 and older to request care online for non-urgent symptoms. For details visit [email or web address].   Also download the MyChart app! Go to the app store, search "MyChart", open the app, select [facility], and log in with your MyChart username and password.  Masks are optional in the cancer centers. If you would like for your care team to wear a mask while they are taking care of you, please let them know. For doctor visits, patients may have with them one support person who is at least 86 years old. At this time, visitors are not allowed in the infusion area. 

## 2022-03-13 NOTE — Progress Notes (Signed)
CRITICAL VALUE STICKER  CRITICAL VALUE: WBC 70.4  DATE & TIME NOTIFIED: 03/13/22 1:30 pm  MD NOTIFIED: Irene Limbo  TIME OF NOTIFICATION:1:33  RESPONSE:  Per Dr Irene Limbo pt ok for tx

## 2022-03-15 ENCOUNTER — Ambulatory Visit
Admission: RE | Admit: 2022-03-15 | Discharge: 2022-03-15 | Disposition: A | Discharge status: Home / Self Care | Payer: PPO | Source: Ambulatory Visit | Attending: Radiation Oncology | Admitting: Radiation Oncology

## 2022-03-15 ENCOUNTER — Other Ambulatory Visit: Payer: Self-pay

## 2022-03-15 DIAGNOSIS — Z5112 Encounter for antineoplastic immunotherapy: Secondary | ICD-10-CM | POA: Diagnosis not present

## 2022-03-15 LAB — RAD ONC ARIA SESSION SUMMARY
Course Elapsed Days: 9
Plan Fractions Treated to Date: 5
Plan Prescribed Dose Per Fraction: 3 Gy
Plan Total Fractions Prescribed: 9
Plan Total Prescribed Dose: 27 Gy
Reference Point Dosage Given to Date: 15 Gy
Reference Point Session Dosage Given: 3 Gy
Session Number: 5

## 2022-03-18 ENCOUNTER — Other Ambulatory Visit: Payer: Self-pay

## 2022-03-18 ENCOUNTER — Ambulatory Visit
Admission: RE | Admit: 2022-03-18 | Discharge: 2022-03-18 | Disposition: A | Discharge status: Home / Self Care | Payer: PPO | Source: Ambulatory Visit | Attending: Radiation Oncology | Admitting: Radiation Oncology

## 2022-03-18 DIAGNOSIS — Z5112 Encounter for antineoplastic immunotherapy: Secondary | ICD-10-CM | POA: Diagnosis not present

## 2022-03-18 LAB — RAD ONC ARIA SESSION SUMMARY
Course Elapsed Days: 12
Plan Fractions Treated to Date: 6
Plan Prescribed Dose Per Fraction: 3 Gy
Plan Total Fractions Prescribed: 9
Plan Total Prescribed Dose: 27 Gy
Reference Point Dosage Given to Date: 18 Gy
Reference Point Session Dosage Given: 3 Gy
Session Number: 6

## 2022-03-20 ENCOUNTER — Ambulatory Visit
Admission: RE | Admit: 2022-03-20 | Discharge: 2022-03-20 | Disposition: A | Discharge status: Home / Self Care | Payer: PPO | Source: Ambulatory Visit | Attending: Radiation Oncology | Admitting: Radiation Oncology

## 2022-03-20 ENCOUNTER — Ambulatory Visit: Payer: PPO | Admitting: Podiatry

## 2022-03-20 ENCOUNTER — Other Ambulatory Visit: Payer: Self-pay

## 2022-03-20 DIAGNOSIS — Z87891 Personal history of nicotine dependence: Secondary | ICD-10-CM | POA: Insufficient documentation

## 2022-03-20 DIAGNOSIS — C9110 Chronic lymphocytic leukemia of B-cell type not having achieved remission: Secondary | ICD-10-CM | POA: Insufficient documentation

## 2022-03-20 DIAGNOSIS — Z5112 Encounter for antineoplastic immunotherapy: Secondary | ICD-10-CM | POA: Diagnosis present

## 2022-03-20 DIAGNOSIS — C434 Malignant melanoma of scalp and neck: Secondary | ICD-10-CM | POA: Diagnosis present

## 2022-03-20 DIAGNOSIS — C4339 Malignant melanoma of other parts of face: Secondary | ICD-10-CM | POA: Diagnosis present

## 2022-03-20 LAB — RAD ONC ARIA SESSION SUMMARY
Course Elapsed Days: 14
Plan Fractions Treated to Date: 7
Plan Prescribed Dose Per Fraction: 3 Gy
Plan Total Fractions Prescribed: 9
Plan Total Prescribed Dose: 27 Gy
Reference Point Dosage Given to Date: 21 Gy
Reference Point Session Dosage Given: 3 Gy
Session Number: 7

## 2022-03-22 ENCOUNTER — Other Ambulatory Visit: Payer: Self-pay

## 2022-03-22 ENCOUNTER — Ambulatory Visit
Admission: RE | Admit: 2022-03-22 | Discharge: 2022-03-22 | Disposition: A | Discharge status: Home / Self Care | Payer: PPO | Source: Ambulatory Visit | Attending: Radiation Oncology | Admitting: Radiation Oncology

## 2022-03-22 ENCOUNTER — Ambulatory Visit: Payer: PPO

## 2022-03-22 DIAGNOSIS — Z5112 Encounter for antineoplastic immunotherapy: Secondary | ICD-10-CM | POA: Diagnosis not present

## 2022-03-22 LAB — RAD ONC ARIA SESSION SUMMARY
Course Elapsed Days: 16
Plan Fractions Treated to Date: 8
Plan Prescribed Dose Per Fraction: 3 Gy
Plan Total Fractions Prescribed: 9
Plan Total Prescribed Dose: 27 Gy
Reference Point Dosage Given to Date: 24 Gy
Reference Point Session Dosage Given: 3 Gy
Session Number: 8

## 2022-03-25 ENCOUNTER — Other Ambulatory Visit: Payer: Self-pay

## 2022-03-25 ENCOUNTER — Encounter: Payer: Self-pay | Admitting: Radiation Oncology

## 2022-03-25 ENCOUNTER — Ambulatory Visit
Admission: RE | Admit: 2022-03-25 | Discharge: 2022-03-25 | Disposition: A | Discharge status: Home / Self Care | Payer: PPO | Source: Ambulatory Visit | Attending: Radiation Oncology | Admitting: Radiation Oncology

## 2022-03-25 ENCOUNTER — Ambulatory Visit: Payer: PPO

## 2022-03-25 DIAGNOSIS — Z5112 Encounter for antineoplastic immunotherapy: Secondary | ICD-10-CM | POA: Diagnosis not present

## 2022-03-25 LAB — RAD ONC ARIA SESSION SUMMARY
Course Elapsed Days: 19
Plan Fractions Treated to Date: 9
Plan Prescribed Dose Per Fraction: 3 Gy
Plan Total Fractions Prescribed: 9
Plan Total Prescribed Dose: 27 Gy
Reference Point Dosage Given to Date: 27 Gy
Reference Point Session Dosage Given: 3 Gy
Session Number: 9

## 2022-03-29 ENCOUNTER — Other Ambulatory Visit: Payer: Self-pay

## 2022-03-29 ENCOUNTER — Encounter (HOSPITAL_BASED_OUTPATIENT_CLINIC_OR_DEPARTMENT_OTHER): Payer: Self-pay

## 2022-03-29 ENCOUNTER — Telehealth: Payer: Self-pay | Admitting: *Deleted

## 2022-03-29 ENCOUNTER — Emergency Department (HOSPITAL_BASED_OUTPATIENT_CLINIC_OR_DEPARTMENT_OTHER)
Admission: EM | Admit: 2022-03-29 | Discharge: 2022-03-29 | Disposition: A | Discharge status: Home / Self Care | Payer: PPO | Attending: Emergency Medicine | Admitting: Emergency Medicine

## 2022-03-29 ENCOUNTER — Other Ambulatory Visit: Payer: Self-pay | Admitting: *Deleted

## 2022-03-29 DIAGNOSIS — R220 Localized swelling, mass and lump, head: Secondary | ICD-10-CM | POA: Diagnosis present

## 2022-03-29 DIAGNOSIS — C439 Malignant melanoma of skin, unspecified: Secondary | ICD-10-CM

## 2022-03-29 DIAGNOSIS — C792 Secondary malignant neoplasm of skin: Secondary | ICD-10-CM | POA: Diagnosis not present

## 2022-03-29 NOTE — Discharge Instructions (Signed)
Follow-up with your oncologist as scheduled for Wednesday.  If you develop a fever or swelling to the floor the mouth trouble breathing or trouble swallowing need to get seen right away.  Clinically I suspect that this is just swelling secondary to radiation with think if this was an infection that we will have fever.

## 2022-03-29 NOTE — ED Notes (Signed)
Discharge instructions and follow up care reviewed and explained, pt verbalized understanding and had no further questions on d/c. Pt caox4 and ambulatory on d/c.  

## 2022-03-29 NOTE — ED Triage Notes (Signed)
Patient here POV from Home.  Endorses 10-12 Radiation Sessions for Melanoma on the Face over the Past 3 Weeks. Last Treatment was Tuesday and the Patient noted over 2-3 Days worsening Swelling to Left Jaw/Face.  No Known Fevers. No Oral/Airway Occlusion at this Time.   NAD Noted during Triage. A&Ox4. GCS 15. Ambulatory.

## 2022-03-29 NOTE — Telephone Encounter (Signed)
Mr [facility] Z[facility]ala left a message that he has immense swelling in his face. Daughter states it is more red and swollen than before. Also states it is pulling on his face down towards neck. Instructed to go to the ED for evaluation.

## 2022-03-29 NOTE — ED Provider Notes (Signed)
[facility] EMERGENCY DEPT Provider Note   CSN: [id number] Arrival date & time: 03/29/22  1239     History  Chief Complaint  Patient presents with   Facial Swelling    Thomas Z[facility]ala is a 88 y.o. male.  Patient with metastatic melanoma.  Receiving immunotherapy and we just went through a course of radiation therapy to the left side of the face.  Completed that on Tuesday has follow-up with his oncologist at [facility] on Wednesday.  Swelling is gotten pretty pronounced just today.  Improved some with him sitting up.  Patient's also had partial parotid gland removal on that side.  They were doing the radiation treatment to help prevent some bleeding.  Patient has multiple metastatic melanoma tumors about the scalp area.  In the face.  No active bleeding.  Patient denies any fevers.  Speech is fine no trouble swallowing no trouble breathing.  No swelling to the floor the mouth.       Home Medications Prior to Admission medications   Medication Sig Start Date End Date Taking? Authorizing Provider  acetaminophen (TYLENOL) 500 MG tablet Take 500-1,000 mg by mouth every 6 (six) hours as needed (for pain).    [provider]  carboxymethylcellul-glycerin (REFRESH OPTIVE) 0.5-0.9 % ophthalmic solution Place 1 drop into both eyes 3 (three) times daily as needed for dry eyes.    [provider]  Dextromethorphan-guaiFENesin (MUCINEX DM PO) Take 1 Dose by mouth at bedtime as needed (cough, sleep).    [provider]  finasteride (PROSCAR) 5 MG tablet Take 5 mg by mouth daily.    [provider]  hydrALAZINE (APRESOLINE) 25 MG tablet TAKE 1 TABLET BY MOUTH THREE TIMES A DAY 02/28/22   Cantwell, Celeste C, PA-C  loratadine (CLARITIN) 10 MG tablet Take 10 mg by mouth daily as needed for allergies or rhinitis.    [provider]  losartan-hydrochlorothiazide (HYZAAR) 100-25 MG tablet Take 1 tablet by mouth daily.   07/05/16   [provider]  metFORMIN (GLUCOPHAGE-XR) 500 MG 24 hr tablet Take 500 mg by mouth daily with breakfast. 07/05/16   [provider]  ondansetron (ZOFRAN) 8 MG tablet Take 1 tablet (8 mg total) by mouth 2 (two) times daily as needed (Nausea or vomiting). 12/14/21   Brunetta Genera, MD  PFIZER COVID-19 [facility] BIVALENT injection  05/07/21   [provider]  pindolol (VISKEN) 5 MG tablet TAKE 1 TABLET BY MOUTH TWICE A DAY 10/09/21   Cantwell, Celeste C, PA-C  pindolol (VISKEN) 5 MG tablet Take by mouth. 09/21/21   [provider]  pravastatin (PRAVACHOL) 40 MG tablet Take 40 mg by mouth daily.  07/05/16   [provider]      Allergies    Tape and Fluorouracil    Review of Systems   Review of Systems  Constitutional:  Negative for chills and fever.  HENT:  Positive for facial swelling. Negative for ear pain, sore throat, trouble swallowing and voice change.   Eyes:  Negative for pain and visual disturbance.  Respiratory:  Negative for cough and shortness of breath.   Cardiovascular:  Negative for chest pain and palpitations.  Gastrointestinal:  Negative for abdominal pain and vomiting.  Genitourinary:  Negative for dysuria and hematuria.  Musculoskeletal:  Negative for arthralgias and back pain.  Skin:  Negative for color change and rash.  Neurological:  Positive for facial asymmetry. Negative for seizures, syncope, speech difficulty and headaches.  All other systems reviewed and are negative.   Physical Exam Updated Vital Signs BP (!) 173/75 (BP Location: Right Arm)   Pulse 68   Temp 98.4 F (36.9 C) (Oral)   Resp 18   Ht 1.803 m ('5\' 11"'$ )   Wt 80.7 kg   SpO2 99%   BMI 24.81 kg/m  Physical Exam Vitals and nursing note reviewed.  Constitutional:      General: He is not in acute distress.    Appearance: He is well-developed.  HENT:     Head: Normocephalic.     Comments: Patient with left-sided facial swelling more at the  angle of the jaw and then a little bit anterior and then some into the neck area.  There is a large area of erythema that measures Pacelli on the face probably 10 cm and then some down below the angle of the jaw.  Slight increased warmth.  No real induration no fluctuance.  Lots of skin lesions and scabs to that area.  Scalp has multiple metastatic melanoma tumors scattered about.    Mouth/Throat:     Mouth: Mucous membranes are moist.     Comments: No swelling to the floor the mouth.  Tongue has no swelling.  No lip swelling. Eyes:     Extraocular Movements: Extraocular movements intact.     Conjunctiva/sclera: Conjunctivae normal.     Pupils: Pupils are equal, round, and reactive to light.     Comments: Some erythema to the bilateral lower lid margins.  Cardiovascular:     Rate and Rhythm: Normal rate and regular rhythm.     Heart sounds: No murmur heard. Pulmonary:     Effort: Pulmonary effort is normal. No respiratory distress.     Breath sounds: Normal breath sounds.  Abdominal:     Palpations: Abdomen is soft.     Tenderness: There is no abdominal tenderness.  Musculoskeletal:        General: No swelling.     Cervical back: Neck supple.  Skin:    General: Skin is warm and dry.     Capillary Refill: Capillary refill takes less than 2 seconds.  Neurological:     General: No focal deficit present.     Mental Status: He is alert and oriented to person, place, and time.     Cranial Nerves: No cranial nerve deficit.     Sensory: No sensory deficit.  Psychiatric:        Mood and Affect: Mood normal.     ED Results / Procedures / Treatments   Labs (all labs ordered are listed, but only abnormal results are displayed) Labs Reviewed - No data to display  EKG None  Radiology No results found.  Procedures Procedures    Medications Ordered in ED Medications - No data to display  ED Course/ Medical Decision Making/ A&P                           Medical Decision  Making  Clinically suspect inflammation and erythema secondary to the radiation treatment.  Certainly patient has no difficulty talking no trouble swallowing voice is not hoarse.  No shortness of breath.  Oxygen saturations 100% on room air.  And clearly no fevers.  Patient completed radiation treatment on Tuesday.  Gave the patient the option to do labs and CT soft tissue neck to see if there is evidence of any tumor or abscess.  But clinically doubt this is cellulitis  or infection because would expect fever with as extensive as this is.  Patient in no acute distress.  Patient has opted to wait and see.  We will hold on starting him on any antibiotics for cellulitis again because of the fact that there is no fever.  Patient does not want labs.  Patient plans on following up with his oncologist on Wednesday he will return for any new or worse symptoms to include fever swelling to the floor the mouth difficulty breathing difficulty swallowing.   Final Clinical Impression(s) / ED Diagnoses Final diagnoses:  Left facial swelling  Metastatic melanoma [facility])    Rx / DC Orders ED Discharge Orders     None         Fredia Sorrow, MD 03/29/22 1601

## 2022-04-03 ENCOUNTER — Inpatient Hospital Stay: Payer: PPO

## 2022-04-03 ENCOUNTER — Other Ambulatory Visit: Payer: Self-pay

## 2022-04-03 ENCOUNTER — Inpatient Hospital Stay: Payer: PPO | Attending: Hematology | Admitting: Hematology

## 2022-04-03 ENCOUNTER — Other Ambulatory Visit: Payer: Self-pay | Admitting: Hematology

## 2022-04-03 VITALS — BP 133/61 | HR 72 | Temp 97.7°F | Resp 18 | Wt 173.6 lb

## 2022-04-03 VITALS — BP 122/54 | HR 75 | Resp 16

## 2022-04-03 DIAGNOSIS — C439 Malignant melanoma of skin, unspecified: Secondary | ICD-10-CM

## 2022-04-03 DIAGNOSIS — C9110 Chronic lymphocytic leukemia of B-cell type not having achieved remission: Secondary | ICD-10-CM | POA: Insufficient documentation

## 2022-04-03 DIAGNOSIS — Z5112 Encounter for antineoplastic immunotherapy: Secondary | ICD-10-CM | POA: Insufficient documentation

## 2022-04-03 DIAGNOSIS — C434 Malignant melanoma of scalp and neck: Secondary | ICD-10-CM | POA: Insufficient documentation

## 2022-04-03 DIAGNOSIS — C4339 Malignant melanoma of other parts of face: Secondary | ICD-10-CM | POA: Insufficient documentation

## 2022-04-03 LAB — CBC WITH DIFFERENTIAL (CANCER CENTER ONLY)
Abs Immature Granulocytes: 0.08 10*3/uL — ABNORMAL HIGH (ref 0.00–0.07)
Basophils Absolute: 0.1 10*3/uL (ref 0.0–0.1)
Basophils Relative: 0 %
Eosinophils Absolute: 0.2 10*3/uL (ref 0.0–0.5)
Eosinophils Relative: 0 %
HCT: 31.5 % — ABNORMAL LOW (ref 39.0–52.0)
Hemoglobin: 10.3 g/dL — ABNORMAL LOW (ref 13.0–17.0)
Immature Granulocytes: 0 %
Lymphocytes Relative: 93 %
Lymphs Abs: 73.3 10*3/uL — ABNORMAL HIGH (ref 0.7–4.0)
MCH: 32 pg (ref 26.0–34.0)
MCHC: 32.7 g/dL (ref 30.0–36.0)
MCV: 97.8 fL (ref 80.0–100.0)
Monocytes Absolute: 3.5 10*3/uL — ABNORMAL HIGH (ref 0.1–1.0)
Monocytes Relative: 4 %
Neutro Abs: 2 10*3/uL (ref 1.7–7.7)
Neutrophils Relative %: 3 %
Platelet Count: 97 10*3/uL — ABNORMAL LOW (ref 150–400)
RBC: 3.22 MIL/uL — ABNORMAL LOW (ref 4.22–5.81)
RDW: 14.7 % (ref 11.5–15.5)
Smear Review: NORMAL
WBC Count: 79.1 10*3/uL (ref 4.0–10.5)
nRBC: 0.1 % (ref 0.0–0.2)

## 2022-04-03 LAB — CMP (CANCER CENTER ONLY)
ALT: 11 U/L (ref 0–44)
AST: 19 U/L (ref 15–41)
Albumin: 4.1 g/dL (ref 3.5–5.0)
Alkaline Phosphatase: 106 U/L (ref 38–126)
Anion gap: 4 — ABNORMAL LOW (ref 5–15)
BUN: 19 mg/dL (ref 8–23)
CO2: 30 mmol/L (ref 22–32)
Calcium: 9.3 mg/dL (ref 8.9–10.3)
Chloride: 101 mmol/L (ref 98–111)
Creatinine: 1.05 mg/dL (ref 0.61–1.24)
GFR, Estimated: >60 mL/min (ref >60)
Glucose, Bld: 158 mg/dL — ABNORMAL HIGH (ref 70–99)
Potassium: 3.7 mmol/L (ref 3.5–5.1)
Sodium: 135 mmol/L (ref 135–145)
Total Bilirubin: 0.5 mg/dL (ref 0.3–1.2)
Total Protein: 6.4 g/dL — ABNORMAL LOW (ref 6.5–8.1)

## 2022-04-03 LAB — LACTATE DEHYDROGENASE: LDH: 196 U/L — ABNORMAL HIGH (ref 98–192)

## 2022-04-03 MED ORDER — SODIUM CHLORIDE 0.9 % IV SOLN: Freq: Once | INTRAVENOUS | Status: AC

## 2022-04-03 MED ORDER — SODIUM CHLORIDE 0.9 % IV SOLN
200.0000 mg | Freq: Once | INTRAVENOUS | Status: AC
  Administered 2022-04-03: 200 mg via INTRAVENOUS
  Filled 2022-04-03: qty 200

## 2022-04-03 NOTE — Patient Instructions (Signed)
[facility]  Discharge Instructions: Thank you for choosing [facility] to provide your oncology and hematology care.   If you have a lab appointment with the Cancer Center, please go directly to the Cancer Center and check in at the registration area.   Wear comfortable clothing and clothing appropriate for easy access to any Portacath or PICC line.   We strive to give you quality time with your provider. You may need to reschedule your appointment if you arrive late (15 or more minutes).  Arriving late affects you and other patients whose appointments are after yours.  Also, if you miss three or more appointments without notifying the office, you may be dismissed from the clinic at the provider's discretion.      For prescription refill requests, have your pharmacy contact our office and allow 72 hours for refills to be completed.    Today you received the following chemotherapy and/or immunotherapy agents: Keytruda.       To help prevent nausea and vomiting after your treatment, we encourage you to take your nausea medication as directed.  BELOW ARE SYMPTOMS THAT SHOULD BE REPORTED IMMEDIATELY: *FEVER GREATER THAN 100.4 F (38 C) OR HIGHER *CHILLS OR SWEATING *NAUSEA AND VOMITING THAT IS NOT CONTROLLED WITH YOUR NAUSEA MEDICATION *UNUSUAL SHORTNESS OF BREATH *UNUSUAL BRUISING OR BLEEDING *URINARY PROBLEMS (pain or burning when urinating, or frequent urination) *BOWEL PROBLEMS (unusual diarrhea, constipation, pain near the anus) TENDERNESS IN MOUTH AND THROAT WITH OR WITHOUT PRESENCE OF ULCERS (sore throat, sores in mouth, or a toothache) UNUSUAL RASH, SWELLING OR PAIN  UNUSUAL VAGINAL DISCHARGE OR ITCHING   Items with * indicate a potential emergency and should be followed up as soon as possible or go to the Emergency Department if any problems should occur.  Please show the CHEMOTHERAPY ALERT CARD or IMMUNOTHERAPY ALERT CARD at check-in to  the Emergency Department and triage nurse.  Should you have questions after your visit or need to cancel or reschedule your appointment, please contact [facility]  Dept: [phone]  and follow the prompts.  Office hours are 8:00 a.m. to 4:30 p.m. Monday - Friday. Please note that voicemails left after 4:00 p.m. may not be returned until the following business day.  We are closed weekends and major holidays. You have access to a nurse at all times for urgent questions. Please call the main number to the clinic Dept: [phone] and follow the prompts.   For any non-urgent questions, you may also contact your provider using MyChart. We now offer e-Visits for anyone 18 and older to request care online for non-urgent symptoms. For details visit [email or web address].   Also download the MyChart app! Go to the app store, search "MyChart", open the app, select [facility], and log in with your MyChart username and password.  Masks are optional in the cancer centers. If you would like for your care team to wear a mask while they are taking care of you, please let them know. You may have one support person who is at least 86 years old accompany you for your appointments. 

## 2022-04-03 NOTE — Progress Notes (Signed)
Per Dr. Irene Limbo, Blanco to trt with Plts 97 K/uL today

## 2022-04-10 ENCOUNTER — Encounter: Payer: Self-pay | Admitting: Hematology

## 2022-04-10 NOTE — Progress Notes (Signed)
HEMATOLOGY/ONCOLOGY CLINIC NOTE  Date of Service: 04/03/2022    Patient Care Team: Corrington, Delsa Grana, MD as PCP - General (Family Medicine)  CHIEF COMPLAINTS/PURPOSE OF CONSULTATION:  Follow-up for continued evaluation and management of metastatic melanoma  HISTORY OF PRESENTING ILLNESS:  Please see previous note for details on initial presentation  INTERVAL HISTORY:   Thomas Zavala is a  88 y.o. male who returns for continued valuation and management of his metastatic melanoma. He notes some increase in size of his metastatic nodules on the top of his head.  He had radiation to the lesions in front of his left ear which were bleeding and now these have crusted over.  There is also some swelling but decreased size in the left upper cervical lymph nodes. No other acute new symptoms. No notable toxicities from his immunotherapy at this time. We discussed different treatment options and patient is agreeable to adding ipilimumab to his current pembrolizumab immunotherapy, No fevers no chills no night sweats no unexpected weight loss.  Labs done today were discussed in detail with the patient.  MEDICAL HISTORY:  Past Medical History:  Diagnosis Date   Abdominal hernia    BPH (benign prostatic hyperplasia) 07/25/2016   CLL (chronic lymphocytic leukemia) (HCC)    Heart murmur    Hypertension    Melanoma of forehead ([place])    Melanoma of scalp ([place])    PVC's (premature ventricular contractions)    Type 2 diabetes mellitus without complication, without long-term current use of insulin ([place]) 07/25/2016    SURGICAL HISTORY: Past Surgical History:  Procedure Laterality Date   EYE SURGERY     eye lids   IRRIGATION AND DEBRIDEMENT OF WOUND WITH SPLIT THICKNESS SKIN GRAFT Left 01/05/2020   Procedure: SPLIT THICKNESS SKIN GRAFT Left shoulder;  Surgeon: Wallace Going, DO;  Location: [place];  Service: Plastics;  Laterality: Left;   MASS EXCISION N/A 01/05/2020   Procedure:  EXCISION OF FOREHEAD  MELANOMA;  Surgeon: Wallace Going, DO;  Location: [place];  Service: Plastics;  Laterality: N/A;   MELANOMA EXCISION  02/14/2021   Procedure: MELANOMA EXCISION of scalp;  Surgeon: Wallace Going, DO;  Location: [place];  Service: Plastics;;   PAROTIDECTOMY Left 01/05/2020   Procedure: PAROTIDECTOMY;  Surgeon: Izora Gala, MD;  Location: [place];  Service: ENT;  Laterality: Left;   PROSTATE SURGERY     SENTINEL NODE BIOPSY Left 01/05/2020   Procedure: Sentinel Node Biopsy;  Surgeon: Izora Gala, MD;  Location: [facility];  Service: ENT;  Laterality: Left;    SOCIAL HISTORY: Social History   Socioeconomic History   Marital status: Widowed    Spouse name: Not on file   Number of children: 3   Years of education: Not on file   Highest education level: Not on file  Occupational History   Not on file  Tobacco Use   Smoking status: Former    Packs/day: 1.00    Years: 25.00    Total pack years: 25.00    Types: Cigarettes    Quit date: 52    Years since quitting: 53.6   Smokeless tobacco: Never   Tobacco comments:    quit in the 70's  Vaping Use   Vaping Use: Never used  Substance and Sexual Activity   Alcohol use: Yes    Alcohol/week: 1.0 standard drink of alcohol    Types: 1 Standard drinks or equivalent per week    Comment: socially.   Drug use:  No   Sexual activity: Not on file  Other Topics Concern   Not on file  Social History Narrative   Not on file   Social Determinants of Health   Financial Resource Strain: Not on file  Food Insecurity: Not on file  Transportation Needs: No Transportation Needs (02/26/2022)   PRAPARE - Hydrologist (Medical): No    Lack of Transportation (Non-Medical): No  Physical Activity: Not on file  Stress: Not on file  Social Connections: Not on file  Intimate Partner Violence: Unknown (02/26/2022)   Humiliation, Afraid, Rape, and Kick questionnaire    Fear of Current or Ex-Partner: No     Emotionally Abused: No    Physically Abused: No    Sexually Abused: Not on file    FAMILY HISTORY: Family History  Problem Relation Age of Onset   Hypertension Father     ALLERGIES:  is allergic to tape and fluorouracil.  MEDICATIONS:  Current Outpatient Medications  Medication Sig Dispense Refill   acetaminophen (TYLENOL) 500 MG tablet Take 500-1,000 mg by mouth every 6 (six) hours as needed (for pain).     carboxymethylcellul-glycerin (REFRESH OPTIVE) 0.5-0.9 % ophthalmic solution Place 1 drop into both eyes 3 (three) times daily as needed for dry eyes.     Dextromethorphan-guaiFENesin (MUCINEX DM PO) Take 1 Dose by mouth at bedtime as needed (cough, sleep).     finasteride (PROSCAR) 5 MG tablet Take 5 mg by mouth daily.     hydrALAZINE (APRESOLINE) 25 MG tablet TAKE 1 TABLET BY MOUTH THREE TIMES A DAY 270 tablet 1   loratadine (CLARITIN) 10 MG tablet Take 10 mg by mouth daily as needed for allergies or rhinitis.     losartan-hydrochlorothiazide (HYZAAR) 100-25 MG tablet Take 1 tablet by mouth daily.      metFORMIN (GLUCOPHAGE-XR) 500 MG 24 hr tablet Take 500 mg by mouth daily with breakfast.     ondansetron (ZOFRAN) 8 MG tablet Take 1 tablet (8 mg total) by mouth 2 (two) times daily as needed (Nausea or vomiting). 30 tablet 1   pindolol (VISKEN) 5 MG tablet TAKE 1 TABLET BY MOUTH TWICE A DAY 180 tablet 1   pravastatin (PRAVACHOL) 40 MG tablet Take 40 mg by mouth daily.      PFIZER COVID-19 VAC BIVALENT injection      pindolol (VISKEN) 5 MG tablet Take by mouth.     No current facility-administered medications for this visit.    REVIEW OF SYSTEMS:  10 Point review of Systems was done is negative except as noted above.  PHYSICAL EXAMINATION: ECOG PERFORMANCE STATUS: 2 - Symptomatic, <50% confined to bed  . Vitals:   04/03/22 0947  BP: 133/61  Pulse: 72  Resp: 18  Temp: 97.7 F (36.5 C)  SpO2: 99%    Filed Weights   04/03/22 0947  Weight: 173 lb 9.6 oz (78.7  kg)    Body mass index is 24.21 kg/m.  [name] GENERAL:alert, in no acute distress and comfortable SKIN: Multiple skin lesions from metastatic melanoma on his scalp and upper forehead.  Crusting lesion in front of the left ear left upper cervical lymphadenopathy noted OROPHARYNX: MMM, no exudates, no oropharyngeal erythema or ulceration NECK: supple, no JVD LYMPH:  no palpable lymphadenopathy in the cervical, axillary or inguinal regions LUNGS: clear to auscultation b/l with normal respiratory effort HEART: regular rate & rhythm ABDOMEN:  normoactive bowel sounds , non tender, not distended. Extremity: no pedal edema PSYCH: alert &  oriented x 3 with fluent speech NEURO: no focal motor/sensory deficits   LABORATORY DATA:  I have reviewed the data as listed  .    Latest Ref Rng & Units 04/03/2022    9:29 AM 03/13/2022    1:14 PM 02/20/2022    8:34 AM  CBC  WBC 4.0 - 10.5 K/uL 79.1  70.5  90.1   Hemoglobin 13.0 - 17.0 g/dL 10.3  10.1  10.0   Hematocrit 39.0 - 52.0 % 31.5  31.7  31.1   Platelets 150 - 400 K/uL 97  94  109     .    Latest Ref Rng & Units 04/03/2022    9:29 AM 03/13/2022    1:14 PM 02/20/2022    8:34 AM  CMP  Glucose 70 - 99 mg/dL 158  149  157   BUN 8 - 23 mg/dL '19  24  24   '$ Creatinine 0.61 - 1.24 mg/dL 1.05  1.12  1.20   Sodium 135 - 145 mmol/L 135  138  136   Potassium 3.5 - 5.1 mmol/L 3.7  3.8  3.9   Chloride 98 - 111 mmol/L 101  104  101   CO2 22 - 32 mmol/L '30  28  27   '$ Calcium 8.9 - 10.3 mg/dL 9.3  8.9  9.2   Total Protein 6.5 - 8.1 g/dL 6.4  6.1  6.1   Total Bilirubin 0.3 - 1.2 mg/dL 0.5  0.4  0.5   Alkaline Phos 38 - 126 U/L 106  85  93   AST 15 - 41 U/L '19  19  22   '$ ALT 0 - 44 U/L '11  11  15    '$ . Lab Results  Component Value Date   LDH 196 (H) 04/03/2022    07/15/16 Cytogenetics and Flow Cytometry:   11/12/19 of Molecular Pathology   01/05/20 of Surgical Pathology Report [id number])  RADIOGRAPHIC STUDIES: I have personally reviewed the  radiological images as listed and agreed with the findings in the report. No results found.   ASSESSMENT & PLAN:  88 y.o. male with  1. Chronic Lymphocytic Leukemia Diagnosed in November 2017 with flow cytometry 07/15/16 Cytogenetics revealed Trisomy 12  09/03/16 CT A/P revealed Borderline splenomegaly with 1.3 cm low-attenuation lesion in the anterior aspect of spleen. Mild upper abdominal and right iliac lymphadenopathy. 4 mm indeterminate pulmonary nodule in right lung base. Recommend continued attention on follow-up CT. Incidentally noted benign right hepatic lobe hemangioma, mildly and enlarged prostate, and colonic diverticulosis  10/10/17 CT Chest revealed Scattered small bilateral pulmonary nodules, including two dominant right middle lobe nodules measuring 5-6 mm, unchanged, likely benign. If clinically warranted, consider a single follow-up CT chest in 1 year. Mild mediastinal and bilateral hilar lymphadenopathy, likely related to the patient's known CLL. Mild splenomegaly, incompletely visualized. Aortic Atherosclerosis and Emphysema  2. 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  patient declined adjuvant immunotheraphy Now with multiple cutaneous metastases from melanoma over the scalp as well as left cervical lymph node metastasis based on PET scan.  On 12/20/2021.  MRI brain shows chronic meningioma but no evidence of metastatic melanoma to the brain.  PLAN: -Patient's labs done today were discussed in detail with the patient. -He has completed his palliative radiation which has led to cessation of bleeding and crusting of his lesions and for his left ear and some decrease in his left cervical lymphadenopathy. -We discussed  different options and since multiple growing lesions on the scalp cannot be treated with radiation easily we discussed adding/intensifying his immunotherapy by adding ipilimumab.  We discussed the  risks and benefits of this approach and he is agreeable to proceed with this.  FOLLOW UP: Please schedule next cycle of pembrolizumab plus ipilimumab with labs and MD visit.  The total time spent in the appointment was 30 minutes*.  All of the patient's questions were answered with apparent satisfaction. The patient knows to call the clinic with any problems, questions or concerns.   Sullivan Lone MD MS AAHIVMS [facility] Hematology/Oncology Physician [facility]  .*Total Encounter Time as defined by the Centers for Medicare and Medicaid Services includes, in addition to the face-to-face time of a patient visit (documented in the note above) non-face-to-face time: obtaining and reviewing outside history, ordering and reviewing medications, tests or procedures, care coordination (communications with other health care professionals or caregivers) and documentation in the medical record.

## 2022-04-19 ENCOUNTER — Other Ambulatory Visit: Payer: Self-pay | Admitting: *Deleted

## 2022-04-19 DIAGNOSIS — C439 Malignant melanoma of skin, unspecified: Secondary | ICD-10-CM

## 2022-04-23 ENCOUNTER — Other Ambulatory Visit: Payer: Self-pay

## 2022-04-23 ENCOUNTER — Inpatient Hospital Stay: Payer: PPO

## 2022-04-23 ENCOUNTER — Inpatient Hospital Stay: Payer: PPO | Attending: Hematology | Admitting: Hematology

## 2022-04-23 VITALS — BP 131/62 | HR 67 | Temp 98.1°F | Wt 176.0 lb

## 2022-04-23 DIAGNOSIS — E119 Type 2 diabetes mellitus without complications: Secondary | ICD-10-CM | POA: Insufficient documentation

## 2022-04-23 DIAGNOSIS — Z923 Personal history of irradiation: Secondary | ICD-10-CM | POA: Diagnosis not present

## 2022-04-23 DIAGNOSIS — C434 Malignant melanoma of scalp and neck: Secondary | ICD-10-CM | POA: Diagnosis not present

## 2022-04-23 DIAGNOSIS — C439 Malignant melanoma of skin, unspecified: Secondary | ICD-10-CM | POA: Insufficient documentation

## 2022-04-23 DIAGNOSIS — C4339 Malignant melanoma of other parts of face: Secondary | ICD-10-CM | POA: Insufficient documentation

## 2022-04-23 DIAGNOSIS — C7989 Secondary malignant neoplasm of other specified sites: Secondary | ICD-10-CM | POA: Insufficient documentation

## 2022-04-23 DIAGNOSIS — Z79899 Other long term (current) drug therapy: Secondary | ICD-10-CM | POA: Insufficient documentation

## 2022-04-23 DIAGNOSIS — Z7984 Long term (current) use of oral hypoglycemic drugs: Secondary | ICD-10-CM | POA: Insufficient documentation

## 2022-04-23 DIAGNOSIS — Z87891 Personal history of nicotine dependence: Secondary | ICD-10-CM | POA: Insufficient documentation

## 2022-04-23 DIAGNOSIS — C9110 Chronic lymphocytic leukemia of B-cell type not having achieved remission: Secondary | ICD-10-CM | POA: Insufficient documentation

## 2022-04-23 DIAGNOSIS — Z5112 Encounter for antineoplastic immunotherapy: Secondary | ICD-10-CM | POA: Insufficient documentation

## 2022-04-23 LAB — CBC WITH DIFFERENTIAL (CANCER CENTER ONLY)
Abs Immature Granulocytes: 0 10*3/uL (ref 0.00–0.07)
Basophils Absolute: 0 10*3/uL (ref 0.0–0.1)
Basophils Relative: 0 %
Eosinophils Absolute: 0 10*3/uL (ref 0.0–0.5)
Eosinophils Relative: 0 %
HCT: 32.8 % — ABNORMAL LOW (ref 39.0–52.0)
Hemoglobin: 10.9 g/dL — ABNORMAL LOW (ref 13.0–17.0)
Lymphocytes Relative: 94 %
Lymphs Abs: 70.8 10*3/uL — ABNORMAL HIGH (ref 0.7–4.0)
MCH: 32.3 pg (ref 26.0–34.0)
MCHC: 33.2 g/dL (ref 30.0–36.0)
MCV: 97.3 fL (ref 80.0–100.0)
Monocytes Absolute: 0 10*3/uL — ABNORMAL LOW (ref 0.1–1.0)
Monocytes Relative: 0 %
Neutro Abs: 4.5 10*3/uL (ref 1.7–7.7)
Neutrophils Relative %: 6 %
Platelet Count: 76 10*3/uL — ABNORMAL LOW (ref 150–400)
RBC: 3.37 MIL/uL — ABNORMAL LOW (ref 4.22–5.81)
RDW: 15.3 % (ref 11.5–15.5)
WBC Count: 75.3 10*3/uL (ref 4.0–10.5)
nRBC: 0 % (ref 0.0–0.2)

## 2022-04-23 LAB — CMP (CANCER CENTER ONLY)
ALT: 10 U/L (ref 0–44)
AST: 23 U/L (ref 15–41)
Albumin: 4.2 g/dL (ref 3.5–5.0)
Alkaline Phosphatase: 107 U/L (ref 38–126)
Anion gap: 6 (ref 5–15)
BUN: 20 mg/dL (ref 8–23)
CO2: 29 mmol/L (ref 22–32)
Calcium: 9.4 mg/dL (ref 8.9–10.3)
Chloride: 98 mmol/L (ref 98–111)
Creatinine: 1.05 mg/dL (ref 0.61–1.24)
GFR, Estimated: >60 mL/min (ref >60)
Glucose, Bld: 146 mg/dL — ABNORMAL HIGH (ref 70–99)
Potassium: 3.9 mmol/L (ref 3.5–5.1)
Sodium: 133 mmol/L — ABNORMAL LOW (ref 135–145)
Total Bilirubin: 0.8 mg/dL (ref 0.3–1.2)
Total Protein: 6.3 g/dL — ABNORMAL LOW (ref 6.5–8.1)

## 2022-04-23 LAB — LACTATE DEHYDROGENASE: LDH: 186 U/L (ref 98–192)

## 2022-04-23 NOTE — Progress Notes (Signed)
HEMATOLOGY/ONCOLOGY CLINIC NOTE  Date of Service: 04/23/2022    Patient Care Team: Corrington, Delsa Grana, MD as PCP - General (Family Medicine) Malmfelt, Stephani Police, RN as Oncology Nurse Navigator Eppie Gibson, MD as Consulting Physician (Radiation Oncology)  CHIEF COMPLAINTS/PURPOSE OF CONSULTATION:  Follow-up for continued evaluation and management of metastatic melanoma  HISTORY OF PRESENTING ILLNESS:  Please see previous note for details on initial presentation  INTERVAL HISTORY:  Thomas Zavala is a  88 y.o. male who returns for continued valuation and management of his metastatic melanoma. He reports He is doing well with no new symptoms or concerns.  He notes he is eating well.   He notes bleeding from a few of the left scalp lesions. No other new changes in cervical lymph nodes or other nodules/lesions.  No notable toxicities from his immunotherapy at this time.  We discussed different treatment options and patient is agreeable to adding ipilimumab and nivolumab and holding current pembrolizumab immunotherapy.  No fever, chills, night sweats. No new or unexpected weight loss. No other new or acute focal symptoms.  Labs done today were discussed in detail with the patient.  MEDICAL HISTORY:  Past Medical History:  Diagnosis Date   Abdominal hernia    BPH (benign prostatic hyperplasia) 07/25/2016   CLL (chronic lymphocytic leukemia) (HCC)    Heart murmur    Hypertension    Melanoma of forehead ([place])    Melanoma of scalp ([place])    PVC's (premature ventricular contractions)    Type 2 diabetes mellitus without complication, without long-term current use of insulin ([place]) 07/25/2016    SURGICAL HISTORY: Past Surgical History:  Procedure Laterality Date   EYE SURGERY     eye lids   IRRIGATION AND DEBRIDEMENT OF WOUND WITH SPLIT THICKNESS SKIN GRAFT Left 01/05/2020   Procedure: SPLIT THICKNESS SKIN GRAFT Left shoulder;  Surgeon: Wallace Going, DO;  Location:  [place];  Service: Plastics;  Laterality: Left;   MASS EXCISION N/A 01/05/2020   Procedure: EXCISION OF FOREHEAD  MELANOMA;  Surgeon: Wallace Going, DO;  Location: [place];  Service: Plastics;  Laterality: N/A;   MELANOMA EXCISION  02/14/2021   Procedure: MELANOMA EXCISION of scalp;  Surgeon: Wallace Going, DO;  Location: [place];  Service: Plastics;;   PAROTIDECTOMY Left 01/05/2020   Procedure: PAROTIDECTOMY;  Surgeon: Izora Gala, MD;  Location: [place];  Service: ENT;  Laterality: Left;   PROSTATE SURGERY     SENTINEL NODE BIOPSY Left 01/05/2020   Procedure: Sentinel Node Biopsy;  Surgeon: Izora Gala, MD;  Location: [facility];  Service: ENT;  Laterality: Left;    SOCIAL HISTORY: Social History   Socioeconomic History   Marital status: Widowed    Spouse name: Not on file   Number of children: 3   Years of education: Not on file   Highest education level: Not on file  Occupational History   Not on file  Tobacco Use   Smoking status: Former    Packs/day: 1.00    Years: 25.00    Total pack years: 25.00    Types: Cigarettes    Quit date: 15    Years since quitting: 53.7   Smokeless tobacco: Never   Tobacco comments:    quit in the 70's  Vaping Use   Vaping Use: Never used  Substance and Sexual Activity   Alcohol use: Yes    Alcohol/week: 1.0 standard drink of alcohol    Types: 1 Standard drinks or equivalent  per week    Comment: socially.   Drug use: No   Sexual activity: Not on file  Other Topics Concern   Not on file  Social History Narrative   Not on file   Social Determinants of Health   Financial Resource Strain: Not on file  Food Insecurity: Not on file  Transportation Needs: No Transportation Needs (02/26/2022)   PRAPARE - Hydrologist (Medical): No    Lack of Transportation (Non-Medical): No  Physical Activity: Not on file  Stress: Not on file  Social Connections: Not on file  Intimate Partner Violence: Unknown (02/26/2022)    Humiliation, Afraid, Rape, and Kick questionnaire    Fear of Current or Ex-Partner: No    Emotionally Abused: No    Physically Abused: No    Sexually Abused: Not on file    FAMILY HISTORY: Family History  Problem Relation Age of Onset   Hypertension Father     ALLERGIES:  is allergic to tape and fluorouracil.  MEDICATIONS:  Current Outpatient Medications  Medication Sig Dispense Refill   acetaminophen (TYLENOL) 500 MG tablet Take 500-1,000 mg by mouth every 6 (six) hours as needed (for pain).     carboxymethylcellul-glycerin (REFRESH OPTIVE) 0.5-0.9 % ophthalmic solution Place 1 drop into both eyes 3 (three) times daily as needed for dry eyes.     Dextromethorphan-guaiFENesin (MUCINEX DM PO) Take 1 Dose by mouth at bedtime as needed (cough, sleep).     finasteride (PROSCAR) 5 MG tablet Take 5 mg by mouth daily.     hydrALAZINE (APRESOLINE) 25 MG tablet TAKE 1 TABLET BY MOUTH THREE TIMES A DAY 270 tablet 1   loratadine (CLARITIN) 10 MG tablet Take 10 mg by mouth daily as needed for allergies or rhinitis.     losartan-hydrochlorothiazide (HYZAAR) 100-25 MG tablet Take 1 tablet by mouth daily.      metFORMIN (GLUCOPHAGE-XR) 500 MG 24 hr tablet Take 500 mg by mouth daily with breakfast.     PFIZER COVID-19 VAC BIVALENT injection      pindolol (VISKEN) 5 MG tablet TAKE 1 TABLET BY MOUTH TWICE A DAY 180 tablet 1   pindolol (VISKEN) 5 MG tablet Take by mouth.     pravastatin (PRAVACHOL) 40 MG tablet Take 40 mg by mouth daily.      No current facility-administered medications for this visit.    REVIEW OF SYSTEMS:  10 Point review of Systems was done is negative except as noted above.  PHYSICAL EXAMINATION: ECOG PERFORMANCE STATUS: 2 - Symptomatic, <50% confined to bed  . Vitals:   04/23/22 0929  BP: 131/62  Pulse: 67  Temp: 98.1 F (36.7 C)  SpO2: 97%    Filed Weights   04/23/22 0929  Weight: 176 lb (79.8 kg)    Body mass index is 24.55 kg/m.  NAD GENERAL:alert,  in no acute distress and comfortable SKIN: no acute rashes, no significant lesions EYES: conjunctiva are pink and non-injected, sclera anicteric NECK: supple, no JVD LYMPH:  no palpable lymphadenopathy in the cervical, axillary or inguinal regions LUNGS: clear to auscultation b/l with normal respiratory effort HEART: regular rate & rhythm ABDOMEN:  normoactive bowel sounds , non tender, not distended. Extremity: no pedal edema PSYCH: alert & oriented x 3 with fluent speech NEURO: no focal motor/sensory deficits  Exam performed in chair.  LABORATORY DATA:  I have reviewed the data as listed  .    Latest Ref Rng & Units 04/23/2022  9:03 AM 04/03/2022    9:29 AM 03/13/2022    1:14 PM  CBC  WBC 4.0 - 10.5 K/uL 75.3  79.1  70.5   Hemoglobin 13.0 - 17.0 g/dL 10.9  10.3  10.1   Hematocrit 39.0 - 52.0 % 32.8  31.5  31.7   Platelets 150 - 400 K/uL 76  97  94     .    Latest Ref Rng & Units 04/23/2022    9:03 AM 04/03/2022    9:29 AM 03/13/2022    1:14 PM  CMP  Glucose 70 - 99 mg/dL 146  158  149   BUN 8 - 23 mg/dL '20  19  24   '$ Creatinine 0.61 - 1.24 mg/dL 1.05  1.05  1.12   Sodium 135 - 145 mmol/L 133  135  138   Potassium 3.5 - 5.1 mmol/L 3.9  3.7  3.8   Chloride 98 - 111 mmol/L 98  101  104   CO2 22 - 32 mmol/L '29  30  28   '$ Calcium 8.9 - 10.3 mg/dL 9.4  9.3  8.9   Total Protein 6.5 - 8.1 g/dL 6.3  6.4  6.1   Total Bilirubin 0.3 - 1.2 mg/dL 0.8  0.5  0.4   Alkaline Phos 38 - 126 U/L 107  106  85   AST 15 - 41 U/L '23  19  19   '$ ALT 0 - 44 U/L '10  11  11    '$ . Lab Results  Component Value Date   LDH 186 04/23/2022    07/15/16 Cytogenetics and Flow Cytometry:   11/12/19 of Molecular Pathology   01/05/20 of Surgical Pathology Report [id number])  RADIOGRAPHIC STUDIES: I have personally reviewed the radiological images as listed and agreed with the findings in the report. No results found.   ASSESSMENT & PLAN:  88 y.o. male with  1. Chronic Lymphocytic  Leukemia Diagnosed in November 2017 with flow cytometry 07/15/16 Cytogenetics revealed Trisomy 12  09/03/16 CT A/P revealed Borderline splenomegaly with 1.3 cm low-attenuation lesion in the anterior aspect of spleen. Mild upper abdominal and right iliac lymphadenopathy. 4 mm indeterminate pulmonary nodule in right lung base. Recommend continued attention on follow-up CT. Incidentally noted benign right hepatic lobe hemangioma, mildly and enlarged prostate, and colonic diverticulosis  10/10/17 CT Chest revealed Scattered small bilateral pulmonary nodules, including two dominant right middle lobe nodules measuring 5-6 mm, unchanged, likely benign. If clinically warranted, consider a single follow-up CT chest in 1 year. Mild mediastinal and bilateral hilar lymphadenopathy, likely related to the patient's known CLL. Mild splenomegaly, incompletely visualized. Aortic Atherosclerosis and Emphysema  2. 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  patient declined adjuvant immunotheraphy Now with multiple cutaneous metastases from melanoma over the scalp as well as left cervical lymph node metastasis based on PET scan.  On 12/20/2021.  MRI brain shows chronic meningioma but no evidence of metastatic melanoma to the brain.  PLAN: -Patient's labs done today were discussed in detail with the patient. -No significant new symptoms since last treatment. -switching to Nivo3/Ipi1 due to progressive skin lesions. -can also consider topical imiquimod once tolerance to Ipi/Nivo noted. -still has option for palliative RT if needed for progressive lesions. -FOLLOW UP: F/u for C1 and C2 of Ipi/Nivo as per orders  The total time spent in the appointment was 20 minutes*.  All of the patient's questions were answered with apparent satisfaction. The  patient knows to call the clinic with any problems, questions or concerns.   Sullivan Lone MD MS AAHIVMS [facility]  [facility] Hematology/Oncology Physician [facility]  .*Total Encounter Time as defined by the Centers for Medicare and Medicaid Services includes, in addition to the face-to-face time of a patient visit (documented in the note above) non-face-to-face time: obtaining and reviewing outside history, ordering and reviewing medications, tests or procedures, care coordination (communications with other health care professionals or caregivers) and documentation in the medical record.  I, Melene Muller, am acting as scribe for Dr. Sullivan Lone, MD.  .I have reviewed the above documentation for accuracy and completeness, and I agree with the above.Brunetta Genera MD

## 2022-04-24 ENCOUNTER — Telehealth: Payer: Self-pay | Admitting: Hematology

## 2022-04-24 ENCOUNTER — Encounter: Payer: Self-pay | Admitting: Radiation Oncology

## 2022-04-24 NOTE — Telephone Encounter (Signed)
Scheduled follow-up appointments per appointment request workqueue. Patient is aware. 

## 2022-04-26 NOTE — Progress Notes (Incomplete)
  Radiation Oncology         [phone] ________________________________  Patient Name: Thomas Zavala MRN: [id number] DOB: [birth year] Referring Physician: Sullivan Lone (Profile Not Attached) Date of Service: 03/25/2022 [facility], [place]                                                        End Of Treatment Note  Diagnoses: C79.89-Secondary malignant neoplasm of other specified sites  Cancer Staging: The primary encounter diagnosis was Metastatic melanoma ([place]). A diagnosis of Melanoma of skin ([place]) was also pertinent to this visit.   Recurrent metastatic melanoma   Intent: Palliative  Radiation Treatment Dates: 03/06/2022 through 03/25/2022 Site Technique Total Dose (Gy) Dose per Fx (Gy) Completed Fx Beam Energies  Face: HN specialPort 27/27 3 9/9 6E   Narrative: The patient tolerated radiation therapy relatively well. On the date of his final treatment, the patient denied any issues other than redness and mild swelling to the left side of his face. Physical exam performed on that same date revealed some thinning and shrinkage to the left upper neck node. No skin breakdown or signs of bleeding were appreciated. Overall, treatment successfully stopped bleeding from the dominant lesions.   Plan: The patient will follow-up with radiation oncology in one month .  ________________________________________________ -----------------------------------  Blair Promise, PhD, MD  This document serves as a record of services personally performed by Gery Pray, MD. It was created on his behalf by Roney Mans, a trained medical scribe. The creation of this record is based on the scribe's personal observations and the provider's statements to them. This document has been checked and approved by the attending provider.

## 2022-04-28 ENCOUNTER — Encounter: Payer: Self-pay | Admitting: Hematology

## 2022-04-28 MED ORDER — ONDANSETRON HCL 8 MG PO TABS
8.0000 mg | ORAL_TABLET | Freq: Three times a day (TID) | ORAL | 1 refills | Status: AC | PRN
  Filled 2022-04-28: qty 30, 10d supply, fill #0

## 2022-04-28 NOTE — Progress Notes (Signed)
Radiation Oncology         [phone]) [phone] ________________________________  Name: Thomas Zavala MRN: [id number]  Date: 04/29/2022  DOB: 1927-09-22  Follow-Up Visit Note  CC: Thomas Zavala, Thomas Grana, MD  Thomas Genera, MD  No diagnosis found.  Diagnosis:   The primary encounter diagnosis was Metastatic melanoma ([place]). A diagnosis of Melanoma of skin ([place]) was also pertinent to this visit.   Recurrent metastatic melanoma   Interval Since Last Radiation: 1 month and 4 days   Intent: Palliative  Radiation Treatment Dates: 03/06/2022 through 03/25/2022 Site Technique Total Dose (Gy) Dose per Fx (Gy) Completed Fx Beam Energies  Face: HN specialPort 27/27 3 9/9 6E    Narrative:  The patient returns today for routine follow-up. The patient tolerated radiation therapy relatively well. On the date of his final treatment, the patient denied any issues other than redness and mild swelling to the left side of his face. Physical exam performed on that same date revealed some thinning and shrinkage to the left upper neck node. No skin breakdown or signs of bleeding were appreciated. Overall, treatment successfully stopped bleeding from the dominant lesions.      Shortly after completing RT, the patient presented to the ED on 03/29/22 with the cc of increased left facial swelling. Examination in the ED revealed left-sided facial swelling more at the angle of the jaw, slightly anterior to the jaw, and some into the neck area. A large area of erythema was also appreciated measuring approximately 10 cm and then some down below the angle of the jaw.  No real induration/fluctuance was appreciated.  Given absence of associated symptoms such as trouble swallowing or fever, he was suspected to have inflammation and erythema secondary to radiation treatment. The patient was offered further work up but opted to wait and see if swelling subsided on it's own.                        During a follow-up visit with Dr.  Irene Zavala on 04/03/22, the patient was noted with decreased facial swelling on examination. In regards to his other scalp lesions, the patient agreed to intensifying his immunotherapy by adding ipilimumab.  ***  Allergies:  is allergic to tape and fluorouracil.  Meds: Current Outpatient Medications  Medication Sig Dispense Refill   acetaminophen (TYLENOL) 500 MG tablet Take 500-1,000 mg by mouth every 6 (six) hours as needed (for pain).     carboxymethylcellul-glycerin (REFRESH OPTIVE) 0.5-0.9 % ophthalmic solution Place 1 drop into both eyes 3 (three) times daily as needed for dry eyes.     Dextromethorphan-guaiFENesin (MUCINEX DM PO) Take 1 Dose by mouth at bedtime as needed (cough, sleep).     finasteride (PROSCAR) 5 MG tablet Take 5 mg by mouth daily.     hydrALAZINE (APRESOLINE) 25 MG tablet TAKE 1 TABLET BY MOUTH THREE TIMES A DAY 270 tablet 1   loratadine (CLARITIN) 10 MG tablet Take 10 mg by mouth daily as needed for allergies or rhinitis.     losartan-hydrochlorothiazide (HYZAAR) 100-25 MG tablet Take 1 tablet by mouth daily.      metFORMIN (GLUCOPHAGE-XR) 500 MG 24 hr tablet Take 500 mg by mouth daily with breakfast.     PFIZER COVID-19 VAC BIVALENT injection      pindolol (VISKEN) 5 MG tablet TAKE 1 TABLET BY MOUTH TWICE A DAY 180 tablet 1   pindolol (VISKEN) 5 MG tablet Take by mouth.     pravastatin (  PRAVACHOL) 40 MG tablet Take 40 mg by mouth daily.      No current facility-administered medications for this encounter.    Physical Findings: The patient is in no acute distress. Patient is alert and oriented.  vitals were not taken for this visit. .  No significant changes. Lungs are clear to auscultation bilaterally. Heart has regular rate and rhythm. No palpable cervical, supraclavicular, or axillary adenopathy. Abdomen soft, non-tender, normal bowel sounds.   Lab Findings: Lab Results  Component Value Date   WBC 75.3 (HH) 04/23/2022   HGB 10.9 (L) 04/23/2022   HCT 32.8 (L)  04/23/2022   MCV 97.3 04/23/2022   PLT 76 (L) 04/23/2022    Radiographic Findings: No results found.  Impression:  The primary encounter diagnosis was Metastatic melanoma ([place]). A diagnosis of Melanoma of skin ([place]) was also pertinent to this visit.   Recurrent metastatic melanoma   The patient is recovering from the effects of radiation.  ***  Plan:  ***   *** minutes of total time was spent for this patient encounter, including preparation, face-to-face counseling with the patient and coordination of care, physical exam, and documentation of the encounter. ____________________________________  Thomas Promise, PhD, MD  This document serves as a record of services personally performed by Thomas Pray, MD. It was created on his behalf by Thomas Zavala, a trained medical scribe. The creation of this record is based on the scribe's personal observations and the provider's statements to them. This document has been checked and approved by the attending provider.

## 2022-04-29 ENCOUNTER — Inpatient Hospital Stay: Payer: PPO

## 2022-04-29 ENCOUNTER — Other Ambulatory Visit: Payer: Self-pay

## 2022-04-29 ENCOUNTER — Other Ambulatory Visit (HOSPITAL_BASED_OUTPATIENT_CLINIC_OR_DEPARTMENT_OTHER): Payer: Self-pay

## 2022-04-29 ENCOUNTER — Encounter: Payer: Self-pay | Admitting: Hematology

## 2022-04-29 ENCOUNTER — Ambulatory Visit
Admission: RE | Admit: 2022-04-29 | Discharge: 2022-04-29 | Disposition: A | Discharge status: Home / Self Care | Payer: PPO | Source: Ambulatory Visit | Attending: Radiation Oncology | Admitting: Radiation Oncology

## 2022-04-29 VITALS — BP 146/62 | HR 70 | Temp 96.9°F | Resp 18 | Wt 174.0 lb

## 2022-04-29 DIAGNOSIS — C7989 Secondary malignant neoplasm of other specified sites: Secondary | ICD-10-CM | POA: Insufficient documentation

## 2022-04-29 DIAGNOSIS — Z5112 Encounter for antineoplastic immunotherapy: Secondary | ICD-10-CM | POA: Diagnosis not present

## 2022-04-29 DIAGNOSIS — Z79899 Other long term (current) drug therapy: Secondary | ICD-10-CM | POA: Insufficient documentation

## 2022-04-29 DIAGNOSIS — C439 Malignant melanoma of skin, unspecified: Secondary | ICD-10-CM | POA: Insufficient documentation

## 2022-04-29 DIAGNOSIS — Z7984 Long term (current) use of oral hypoglycemic drugs: Secondary | ICD-10-CM | POA: Insufficient documentation

## 2022-04-29 DIAGNOSIS — C434 Malignant melanoma of scalp and neck: Secondary | ICD-10-CM

## 2022-04-29 DIAGNOSIS — Z923 Personal history of irradiation: Secondary | ICD-10-CM | POA: Insufficient documentation

## 2022-04-29 HISTORY — DX: Personal history of irradiation: Z92.3

## 2022-04-29 MED ORDER — SODIUM CHLORIDE 0.9 % IV SOLN
1.0000 mg/kg | Freq: Once | INTRAVENOUS | Status: AC
  Administered 2022-04-29: 80 mg via INTRAVENOUS
  Filled 2022-04-29: qty 16

## 2022-04-29 MED ORDER — SODIUM CHLORIDE 0.9 % IV SOLN: Freq: Once | INTRAVENOUS | Status: AC

## 2022-04-29 MED ORDER — FAMOTIDINE IN NACL 20-0.9 MG/50ML-% IV SOLN
20.0000 mg | Freq: Once | INTRAVENOUS | Status: AC
  Administered 2022-04-29: 20 mg via INTRAVENOUS
  Filled 2022-04-29: qty 50

## 2022-04-29 MED ORDER — DIPHENHYDRAMINE HCL 50 MG/ML IJ SOLN
25.0000 mg | Freq: Once | INTRAMUSCULAR | Status: AC
  Administered 2022-04-29: 25 mg via INTRAVENOUS
  Filled 2022-04-29: qty 1

## 2022-04-29 MED ORDER — SODIUM CHLORIDE 0.9 % IV SOLN
3.0100 mg/kg | Freq: Once | INTRAVENOUS | Status: AC
  Administered 2022-04-29: 240 mg via INTRAVENOUS
  Filled 2022-04-29: qty 24

## 2022-04-29 NOTE — Progress Notes (Addendum)
Thomas Zavala is here for follow up after radiation treatment to his face.  He denies having any pain or fatigue.  The skin on his left face has a scabbed area by his left ear.  The bleeding has stopped. The lymph node on his left neck is also swollen.  He had infusion today.  BP (!) 152/63 (BP Location: Right Arm, Patient Position: Sitting, Cuff Size: Large)   Pulse 65   Temp 97.7 F (36.5 C)   Resp 20   Ht 6' (1.829 m)   Wt 174 lb 12.8 oz (79.3 kg)   SpO2 100%   BMI 23.71 kg/m

## 2022-04-29 NOTE — Patient Instructions (Addendum)
[facility]  Discharge Instructions: Thank you for choosing [facility] to provide your oncology and hematology care.   If you have a lab appointment with the [facility], please go directly to the [facility] and check in at the registration area.   Wear comfortable clothing and clothing appropriate for easy access to any Portacath or PICC line.   We strive to give you quality time with your provider. You may need to reschedule your appointment if you arrive late (15 or more minutes).  Arriving late affects you and other patients whose appointments are after yours.  Also, if you miss three or more appointments without notifying the office, you may be dismissed from the clinic at the provider's discretion.      For prescription refill requests, have your pharmacy contact our office and allow 72 hours for refills to be completed.    Today you received the following chemotherapy and/or immunotherapy agents: Yervoy, Opdivo.       To help prevent nausea and vomiting after your treatment, we encourage you to take your nausea medication as directed.  BELOW ARE SYMPTOMS THAT SHOULD BE REPORTED IMMEDIATELY: *FEVER GREATER THAN 100.4 F (38 C) OR HIGHER *CHILLS OR SWEATING *NAUSEA AND VOMITING THAT IS NOT CONTROLLED WITH YOUR NAUSEA MEDICATION *UNUSUAL SHORTNESS OF BREATH *UNUSUAL BRUISING OR BLEEDING *URINARY PROBLEMS (pain or burning when urinating, or frequent urination) *BOWEL PROBLEMS (unusual diarrhea, constipation, pain near the anus) TENDERNESS IN MOUTH AND THROAT WITH OR WITHOUT PRESENCE OF ULCERS (sore throat, sores in mouth, or a toothache) UNUSUAL RASH, SWELLING OR PAIN  UNUSUAL VAGINAL DISCHARGE OR ITCHING   Items with * indicate a potential emergency and should be followed up as soon as possible or go to the Emergency Department if any problems should occur.  Please show the CHEMOTHERAPY ALERT CARD or IMMUNOTHERAPY ALERT CARD at  check-in to the Emergency Department and triage nurse.  Should you have questions after your visit or need to cancel or reschedule your appointment, please contact [facility]  Dept: [phone]  and follow the prompts.  Office hours are 8:00 a.m. to 4:30 p.m. Monday - Friday. Please note that voicemails left after 4:00 p.m. may not be returned until the following business day.  We are closed weekends and major holidays. You have access to a nurse at all times for urgent questions. Please call the main number to the clinic Dept: [phone] and follow the prompts.   For any non-urgent questions, you may also contact your provider using MyChart. We now offer e-Visits for anyone 37 and older to request care online for non-urgent symptoms. For details visit [email or web address].   Also download the MyChart app! Go to the app store, search "MyChart", open the app, select [facility], and log in with your MyChart username and password.  Masks are optional in the cancer centers. If you would like for your care team to wear a mask while they are taking care of you, please let them know. You may have one support person who is at least 86 years old accompany you for your appointments. Nivolumab Injection What is this medication? NIVOLUMAB (nye VOL ue mab) treats some types of cancer. It works by helping your immune system slow or stop the spread of cancer cells. It is a monoclonal antibody. This medicine may be used for other purposes; ask your health care provider or pharmacist if you have questions. COMMON BRAND NAME(S): Opdivo What  should I tell my care team before I take this medication? They need to know if you have any of these conditions: Allogeneic stem cell transplant (uses someone else's stem cells) Autoimmune diseases, such as Crohn disease, ulcerative colitis, lupus History of chest radiation Nervous system problems, such as Guillain-Barre syndrome or  myasthenia gravis Organ transplant An unusual or allergic reaction to nivolumab, other medications, foods, dyes, or preservatives Pregnant or trying to get pregnant Breast-feeding How should I use this medication? This medication is infused into a vein. It is given in a hospital or clinic setting. A special MedGuide will be given to you before each treatment. Be sure to read this information carefully each time. Talk to your care team about the use of this medication in children. While it may be prescribed for children as young as 12 years for selected conditions, precautions do apply. Overdosage: If you think you have taken too much of this medicine contact a poison control center or emergency room at once. NOTE: This medicine is only for you. Do not share this medicine with others. What if I miss a dose? Keep appointments for follow-up doses. It is important not to miss your dose. Call your care team if you are unable to keep an appointment. What may interact with this medication? Interactions have not been studied. This list may not describe all possible interactions. Give your health care provider a list of all the medicines, herbs, non-prescription drugs, or dietary supplements you use. Also tell them if you smoke, drink alcohol, or use illegal drugs. Some items may interact with your medicine. What should I watch for while using this medication? Your condition will be monitored carefully while you are receiving this medication. You may need blood work while taking this medication. This medication may cause serious skin reactions. They can happen weeks to months after starting the medication. Contact your care team right away if you notice fevers or flu-like symptoms with a rash. The rash may be red or purple and then turn into blisters or peeling of the skin. You may also notice a red rash with swelling of the face, lips, or lymph nodes in your neck or under your arms. Tell your care team  right away if you have any change in your eyesight. Talk to your care team if you are pregnant or think you might be pregnant. A negative pregnancy test is required before starting this medication. A reliable form of contraception is recommended while taking this medication and for 5 months after the last dose. Talk to your care team about effective forms of contraception. Do not breast-feed while taking this medication and for 5 months after the last dose. What side effects may I notice from receiving this medication? Side effects that you should report to your care team as soon as possible: Allergic reactions--skin rash, itching, hives, swelling of the face, lips, tongue, or throat Dry cough, shortness of breath or trouble breathing Eye pain, redness, irritation, or discharge with blurry or decreased vision Heart muscle inflammation--unusual weakness or fatigue, shortness of breath, chest pain, fast or irregular heartbeat, dizziness, swelling of the ankles, feet, or hands Hormone gland problems--headache, sensitivity to light, unusual weakness or fatigue, dizziness, fast or irregular heartbeat, increased sensitivity to cold or heat, excessive sweating, constipation, hair loss, increased thirst or amount of urine, tremors or shaking, irritability Infusion reactions--chest pain, shortness of breath or trouble breathing, feeling faint or lightheaded Kidney injury (glomerulonephritis)--decrease in the amount of urine, red or dark  brown urine, foamy or bubbly urine, swelling of the ankles, hands, or feet Liver injury--right upper belly pain, loss of appetite, nausea, light-colored stool, dark yellow or brown urine, yellowing skin or eyes, unusual weakness or fatigue Pain, tingling, or numbness in the hands or feet, muscle weakness, change in vision, confusion or trouble speaking, loss of balance or coordination, trouble walking, seizures Rash, fever, and swollen lymph nodes Redness, blistering, peeling,  or loosening of the skin, including inside the mouth Sudden or severe stomach pain, bloody diarrhea, fever, nausea, vomiting Side effects that usually do not require medical attention (report these to your care team if they continue or are bothersome): Bone, joint, or muscle pain Diarrhea Fatigue Loss of appetite Nausea Skin rash This list may not describe all possible side effects. Call your doctor for medical advice about side effects. You may report side effects to FDA at 1-800-FDA-1088. Where should I keep my medication? This medication is given in a hospital or clinic. It will not be stored at home. NOTE: This sheet is a summary. It may not cover all possible information. If you have questions about this medicine, talk to your doctor, pharmacist, or health care provider.  2023 Elsevier/Gold Standard (2021-07-06 00:00:00) Ipilimumab Injection What is this medication? IPILIMUMAB (IP i LIM ue mab) treats some types of cancer. It works by helping your immune system slow or stop the spread of cancer cells. It is a monoclonal antibody. This medicine may be used for other purposes; ask your health care provider or pharmacist if you have questions. COMMON BRAND NAME(S): YERVOY What should I tell my care team before I take this medication? They need to know if you have any of these conditions: Allogeneic stem cell transplant (uses someone else's stem cells) Autoimmune diseases, such as Crohn disease, ulcerative colitis, lupus Nervous system problems, such as Guillain-Barre syndrome or myasthenia gravis Organ transplant An unusual or allergic reaction to ipilimumab, other medications, foods, dyes, or preservatives Pregnant or trying to get pregnant Breast-feeding How should I use this medication? This medication is infused into a vein. It is given by your care team in a hospital or clinic setting. A special MedGuide will be given to you before each treatment. Be sure to read this  information carefully each time. Talk to your care team about the use of this medication in children. While it may be prescribed for children as young as 12 years for selected conditions, precautions do apply. Overdosage: If you think you have taken too much of this medicine contact a poison control center or emergency room at once. NOTE: This medicine is only for you. Do not share this medicine with others. What if I miss a dose? Keep appointments for follow-up doses. It is important not to miss your dose. Call your care team if you are unable to keep an appointment. What may interact with this medication? Interactions are not expected. This list may not describe all possible interactions. Give your health care provider a list of all the medicines, herbs, non-prescription drugs, or dietary supplements you use. Also tell them if you smoke, drink alcohol, or use illegal drugs. Some items may interact with your medicine. What should I watch for while using this medication? Your condition will be monitored carefully while you are receiving this medication. You may need blood work while taking this medication. This medication may cause serious skin reactions. They can happen weeks to months after starting the medication. Contact your care team right away if you notice  fevers or flu-like symptoms with a rash. The rash may be red or purple and then turn into blisters or peeling of the skin. You may also notice a red rash with swelling of the face, lips, or lymph nodes in your neck or under your arms. Tell your care team right away if you have any change in your eyesight. Talk to your care team if you may be pregnant. Serious birth defects can occur if you take this medication during pregnancy and for 3 months after the last dose. You will need a negative pregnancy test before starting this medication. Contraception is recommended while taking this medication and for 3 months after the last dose. Your care  team can help you find the option that works for you. Do not breastfeed while taking this medication and for 3 months after the last dose. What side effects may I notice from receiving this medication? Side effects that you should report to your care team as soon as possible: Allergic reactions--skin rash, itching, hives, swelling of the face, lips, tongue, or throat Dry cough, shortness of breath or trouble breathing Eye pain, redness, irritation, or discharge with blurry or decreased vision Heart muscle inflammation--unusual weakness or fatigue, shortness of breath, chest pain, fast or irregular heartbeat, dizziness, swelling of the ankles, feet, or hands Hormone gland problems--headache, sensitivity to light, unusual weakness or fatigue, dizziness, fast or irregular heartbeat, increased sensitivity to cold or heat, excessive sweating, constipation, hair loss, increased thirst or amount of urine, tremors or shaking, irritability Infusion reactions--chest pain, shortness of breath or trouble breathing, feeling faint or lightheaded Kidney injury (glomerulonephritis)--decrease in the amount of urine, red or dark brown urine, foamy or bubbly urine, swelling of the ankles, hands, or feet Liver injury--right upper belly pain, loss of appetite, nausea, light-colored stool, dark yellow or brown urine, yellowing skin or eyes, unusual weakness or fatigue Pain, tingling, or numbness in the hands or feet, muscle weakness, change in vision, confusion or trouble speaking, loss of balance or coordination, trouble walking, seizures Rash, fever, and swollen lymph nodes Redness, blistering, peeling, or loosening of the skin, including inside the mouth Sudden or severe stomach pain, bloody diarrhea, fever, nausea, vomiting Side effects that usually do not require medical attention (report to your care team if they continue or are bothersome): Bone, joint, or muscle pain Diarrhea Fatigue Loss of  appetite Nausea Skin rash This list may not describe all possible side effects. Call your doctor for medical advice about side effects. You may report side effects to FDA at 1-800-FDA-1088. Where should I keep my medication? This medication is given in a hospital or clinic. It will not be stored at home. NOTE: This sheet is a summary. It may not cover all possible information. If you have questions about this medicine, talk to your doctor, pharmacist, or health care provider.  2023 Elsevier/Gold Standard (2021-12-18 00:00:00)

## 2022-04-29 NOTE — Progress Notes (Signed)
Per Irene Limbo, ok to treat with PLT 76

## 2022-04-29 NOTE — Progress Notes (Signed)
Ipilimumab (YERVOY) Patient Monitoring Assessment   Is the patient experiencing any of the following general symptoms?:  '[]'$ Difficulty performing normal activities '[]'$ Feeling sluggish or cold all the time '[]'$ Unusual weight gain '[]'$ Constant or unusual headaches '[]'$ Feeling dizzy or faint '[]'$ Changes in eyesight (blurry vision, double vision, or other vision problems) '[]'$ Changes in mood or behavior (ex: decreased sex drive, irritability, or forgetfulness) '[]'$ Starting new medications (ex: steroids, other medications that lower immune response) '[x]'$ Patient is not experiencing any of the general symptoms above.    Gastrointestinal  Patient is having 1 bowel movements each day.  Is this different from baseline? '[]'$ Yes '[x]'$ No Are your stools watery or do they have a foul smell? '[]'$ Yes '[x]'$ No Have you seen blood in your stools? '[]'$ Yes '[x]'$ No Are your stools dark, tarry, or sticky? '[]'$ Yes '[x]'$ No Are you having pain or tenderness in your belly? '[]'$ Yes '[x]'$ No  Skin Does your skin itch? '[]'$ Yes '[x]'$ No Do you have a rash? '[]'$ Yes '[x]'$ No Has your skin blistered and/or peeled? '[]'$ Yes '[x]'$ No Do you have sores in your mouth? '[]'$ Yes '[x]'$ No  Hepatic Has your urine been dark or tea colored? '[]'$ Yes '[x]'$ No Have you noticed that your skin or the whites of your eyes are turning yellow? '[]'$ Yes '[x]'$ No Are you bleeding or bruising more easily than normal? '[]'$ Yes '[x]'$ No Are you nauseous and/or vomiting? '[]'$ Yes '[x]'$ No Do you have pain on the right side of your stomach? '[]'$ Yes '[x]'$ No  Neurologic  Are you having unusual weakness of legs, arms, or face? '[]'$ Yes '[x]'$ No Are you having numbness or tingling in your hands or feet? '[]'$ Yes '[x]'$ No  Rafael Bihari

## 2022-05-01 ENCOUNTER — Encounter: Payer: Self-pay | Admitting: Podiatry

## 2022-05-01 ENCOUNTER — Ambulatory Visit (INDEPENDENT_AMBULATORY_CARE_PROVIDER_SITE_OTHER): Payer: PPO | Admitting: Podiatry

## 2022-05-01 DIAGNOSIS — E119 Type 2 diabetes mellitus without complications: Secondary | ICD-10-CM | POA: Diagnosis not present

## 2022-05-01 DIAGNOSIS — B351 Tinea unguium: Secondary | ICD-10-CM

## 2022-05-01 DIAGNOSIS — M79674 Pain in right toe(s): Secondary | ICD-10-CM

## 2022-05-01 DIAGNOSIS — M79675 Pain in left toe(s): Secondary | ICD-10-CM | POA: Diagnosis not present

## 2022-05-03 ENCOUNTER — Encounter (HOSPITAL_COMMUNITY): Payer: Self-pay | Admitting: Hematology
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
## 2022-05-13 ENCOUNTER — Other Ambulatory Visit (HOSPITAL_BASED_OUTPATIENT_CLINIC_OR_DEPARTMENT_OTHER): Payer: Self-pay

## 2022-05-14 ENCOUNTER — Ambulatory Visit: Payer: PPO | Admitting: Hematology

## 2022-05-14 ENCOUNTER — Other Ambulatory Visit: Payer: PPO

## 2022-05-14 ENCOUNTER — Ambulatory Visit: Payer: PPO

## 2022-05-21 ENCOUNTER — Inpatient Hospital Stay: Payer: PPO

## 2022-05-21 ENCOUNTER — Inpatient Hospital Stay (HOSPITAL_BASED_OUTPATIENT_CLINIC_OR_DEPARTMENT_OTHER): Payer: PPO | Admitting: Hematology

## 2022-05-21 ENCOUNTER — Inpatient Hospital Stay: Payer: PPO | Attending: Hematology

## 2022-05-21 ENCOUNTER — Other Ambulatory Visit: Payer: Self-pay

## 2022-05-21 ENCOUNTER — Other Ambulatory Visit (HOSPITAL_BASED_OUTPATIENT_CLINIC_OR_DEPARTMENT_OTHER): Payer: Self-pay

## 2022-05-21 VITALS — BP 124/56 | HR 72 | Temp 98.3°F | Resp 15 | Wt 167.8 lb

## 2022-05-21 DIAGNOSIS — C434 Malignant melanoma of scalp and neck: Secondary | ICD-10-CM | POA: Diagnosis not present

## 2022-05-21 DIAGNOSIS — E119 Type 2 diabetes mellitus without complications: Secondary | ICD-10-CM | POA: Insufficient documentation

## 2022-05-21 DIAGNOSIS — Z79899 Other long term (current) drug therapy: Secondary | ICD-10-CM | POA: Diagnosis not present

## 2022-05-21 DIAGNOSIS — Z7984 Long term (current) use of oral hypoglycemic drugs: Secondary | ICD-10-CM | POA: Insufficient documentation

## 2022-05-21 DIAGNOSIS — C9110 Chronic lymphocytic leukemia of B-cell type not having achieved remission: Secondary | ICD-10-CM | POA: Insufficient documentation

## 2022-05-21 DIAGNOSIS — Z923 Personal history of irradiation: Secondary | ICD-10-CM | POA: Insufficient documentation

## 2022-05-21 DIAGNOSIS — C439 Malignant melanoma of skin, unspecified: Secondary | ICD-10-CM | POA: Diagnosis not present

## 2022-05-21 DIAGNOSIS — Z87891 Personal history of nicotine dependence: Secondary | ICD-10-CM | POA: Diagnosis not present

## 2022-05-21 DIAGNOSIS — Z5112 Encounter for antineoplastic immunotherapy: Secondary | ICD-10-CM | POA: Insufficient documentation

## 2022-05-21 DIAGNOSIS — C4339 Malignant melanoma of other parts of face: Secondary | ICD-10-CM | POA: Insufficient documentation

## 2022-05-21 DIAGNOSIS — C7989 Secondary malignant neoplasm of other specified sites: Secondary | ICD-10-CM | POA: Insufficient documentation

## 2022-05-21 LAB — CMP (CANCER CENTER ONLY)
ALT: 10 U/L (ref 0–44)
AST: 20 U/L (ref 15–41)
Albumin: 3.9 g/dL (ref 3.5–5.0)
Alkaline Phosphatase: 113 U/L (ref 38–126)
Anion gap: 5 (ref 5–15)
BUN: 39 mg/dL — ABNORMAL HIGH (ref 8–23)
CO2: 30 mmol/L (ref 22–32)
Calcium: 9.2 mg/dL (ref 8.9–10.3)
Chloride: 101 mmol/L (ref 98–111)
Creatinine: 1.42 mg/dL — ABNORMAL HIGH (ref 0.61–1.24)
GFR, Estimated: 47 mL/min — ABNORMAL LOW
Glucose, Bld: 125 mg/dL — ABNORMAL HIGH (ref 70–99)
Potassium: 4.5 mmol/L (ref 3.5–5.1)
Sodium: 136 mmol/L (ref 135–145)
Total Bilirubin: 0.5 mg/dL (ref 0.3–1.2)
Total Protein: 6 g/dL — ABNORMAL LOW (ref 6.5–8.1)

## 2022-05-21 LAB — CBC WITH DIFFERENTIAL (CANCER CENTER ONLY)
Abs Immature Granulocytes: 0.12 10*3/uL — ABNORMAL HIGH (ref 0.00–0.07)
Basophils Absolute: 0.1 10*3/uL (ref 0.0–0.1)
Basophils Relative: 0 %
Eosinophils Absolute: 0.2 10*3/uL (ref 0.0–0.5)
Eosinophils Relative: 0 %
HCT: 32.8 % — ABNORMAL LOW (ref 39.0–52.0)
Hemoglobin: 10.4 g/dL — ABNORMAL LOW (ref 13.0–17.0)
Immature Granulocytes: 0 %
Lymphocytes Relative: 91 %
Lymphs Abs: 81.8 10*3/uL — ABNORMAL HIGH (ref 0.7–4.0)
MCH: 31.5 pg (ref 26.0–34.0)
MCHC: 31.7 g/dL (ref 30.0–36.0)
MCV: 99.4 fL (ref 80.0–100.0)
Monocytes Absolute: 5.5 10*3/uL — ABNORMAL HIGH (ref 0.1–1.0)
Monocytes Relative: 6 %
Neutro Abs: 2.9 10*3/uL (ref 1.7–7.7)
Neutrophils Relative %: 3 %
Platelet Count: 151 10*3/uL (ref 150–400)
RBC: 3.3 MIL/uL — ABNORMAL LOW (ref 4.22–5.81)
RDW: 14.8 % (ref 11.5–15.5)
WBC Count: 90.6 10*3/uL (ref 4.0–10.5)
nRBC: 0 % (ref 0.0–0.2)

## 2022-05-21 MED ORDER — SODIUM CHLORIDE 0.9 % IV SOLN
1.0000 mg/kg | Freq: Once | INTRAVENOUS | Status: AC
  Administered 2022-05-21: 80 mg via INTRAVENOUS
  Filled 2022-05-21: qty 16

## 2022-05-21 MED ORDER — FAMOTIDINE IN NACL 20-0.9 MG/50ML-% IV SOLN
20.0000 mg | Freq: Once | INTRAVENOUS | Status: AC
  Administered 2022-05-21: 20 mg via INTRAVENOUS
  Filled 2022-05-21: qty 50

## 2022-05-21 MED ORDER — SODIUM CHLORIDE 0.9 % IV SOLN
3.0100 mg/kg | Freq: Once | INTRAVENOUS | Status: AC
  Administered 2022-05-21: 240 mg via INTRAVENOUS
  Filled 2022-05-21: qty 24

## 2022-05-21 MED ORDER — SODIUM CHLORIDE 0.9 % IV SOLN: Freq: Once | INTRAVENOUS | Status: AC

## 2022-05-21 MED ORDER — IMIQUIMOD 5 % EX CREA: TOPICAL_CREAM | CUTANEOUS | 0 refills | Status: AC

## 2022-05-21 MED ORDER — IMIQUIMOD 5 % EX CREA
TOPICAL_CREAM | CUTANEOUS | 0 refills | Status: DC
  Filled 2022-05-21: qty 12, fill #0

## 2022-05-21 MED ORDER — DIPHENHYDRAMINE HCL 50 MG/ML IJ SOLN
25.0000 mg | Freq: Once | INTRAMUSCULAR | Status: AC
  Administered 2022-05-21: 25 mg via INTRAVENOUS
  Filled 2022-05-21: qty 1

## 2022-05-21 NOTE — Progress Notes (Signed)
Ipilimumab (YERVOY) Patient Monitoring Assessment   Is the patient experiencing any of the following general symptoms?:  '[x]'$ Patient is not experiencing any of the general symptoms listed in this section.  '[]'$ Difficulty performing normal activities '[]'$ Feeling sluggish or cold all the time '[]'$ Unusual weight gain '[]'$ Constant or unusual headaches '[]'$ Feeling dizzy or faint '[]'$ Changes in eyesight (blurry vision, double vision, or other vision problems) '[]'$ Changes in mood or behavior (ex: decreased sex drive, irritability, or forgetfulness) '[]'$ Starting new medications (ex: steroids, other medications that lower immune response)   Gastrointestinal  Patient is having  1 bowel movements each day.  Is this different from baseline? '[]'$ Yes '[x]'$ No Are your stools watery or do they have a foul smell? '[]'$ Yes '[x]'$ No Have you seen blood in your stools? '[]'$ Yes '[x]'$ No Are your stools dark, tarry, or sticky? '[]'$ Yes '[x]'$ No Are you having pain or tenderness in your belly? '[]'$ Yes '[x]'$ No  Skin Does your skin itch? '[]'$ Yes '[x]'$ No Do you have a rash? '[]'$ Yes '[x]'$ No Has your skin blistered and/or peeled? '[]'$ Yes '[x]'$ No Do you have sores in your mouth? '[]'$ Yes '[]'$ No  Hepatic Has your urine been dark or tea colored? '[]'$ Yes '[x]'$ No Have you noticed your skin or the whites of your eyes are turning yellow? '[]'$ Yes '[x]'$ No Are you bleeding or bruising more easily than normal? '[]'$ Yes '[x]'$ No Are you nauseous and/or vomiting? '[]'$ Yes '[x]'$ No Do you have pain on the right side of your stomach? '[]'$ Yes '[x]'$ No  Neurologic  Are you having unusual weakness of legs, arms, or face? '[]'$ Yes '[x]'$ No Are you having numbness or tingling in your hands or feet? '[]'$ Yes '[x]'$ No  Thomas Zavala

## 2022-05-21 NOTE — Patient Instructions (Signed)
[facility]COLOG[facility]   Discharge Instructions: Thank you for choosing [facility] to provide your oncology and hematology care.   If you have a lab appointment with the [facility], please go directly to the [facility] and check in at the registration area.   Wear comfortable clothing and clothing appropriate for easy access to any Portacath or PICC line.   We strive to give you quality time with your provider. You may need to reschedule your appointment if you arrive late (15 or more minutes).  Arriving late affects you and other patients whose appointments are after yours.  Also, if you miss three or more appointments without notifying the office, you may be dismissed from the clinic at the provider's discretion.      For prescription refill requests, have your pharmacy contact our office and allow 72 hours for refills to be completed.    Today you received the following chemotherapy and/or immunotherapy agents: nivolumab and ipilimumab      To help prevent nausea and vomiting after your treatment, we encourage you to take your nausea medication as directed.  BELOW ARE SYMPTOMS THAT SHOULD BE REPORTED IMMEDIATELY: *FEVER GREATER THAN 100.4 F (38 C) OR HIGHER *CHILLS OR SWEATING *NAUSEA AND VOMITING THAT IS NOT CONTROLLED WITH YOUR NAUSEA MEDICATION *UNUSUAL SHORTNESS OF BREATH *UNUSUAL BRUISING OR BLEEDING *URINARY PROBLEMS (pain or burning when urinating, or frequent urination) *BOWEL PROBLEMS (unusual diarrhea, constipation, pain near the anus) TENDERNESS IN MOUTH AND THROAT WITH OR WITHOUT PRESENCE OF ULCERS (sore throat, sores in mouth, or a toothache) UNUSUAL RASH, SWELLING OR PAIN  UNUSUAL VAGINAL DISCHARGE OR ITCHING   Items with * indicate a potential emergency and should be followed up as soon as possible or go to the Emergency Department if any problems should occur.  Please show the CHEMOTHERAPY ALERT CARD or IMMUNOTHERAPY ALERT  CARD at check-in to the Emergency Department and triage nurse.  Should you have questions after your visit or need to cancel or reschedule your appointment, please contact [facility]  Dept: [phone]  and follow the prompts.  Office hours are 8:00 a.m. to 4:30 p.m. Monday - Friday. Please note that voicemails left after 4:00 p.m. may not be returned until the following business day.  We are closed weekends and major holidays. You have access to a nurse at all times for urgent questions. Please call the main number to the clinic Dept: [phone] and follow the prompts.   For any non-urgent questions, you may also contact your provider using MyChart. We now offer e-Visits for anyone 48 and older to request care online for non-urgent symptoms. For details visit [email or web address].   Also download the MyChart app! Go to the app store, search "MyChart", open the app, select [facility], and log in with your MyChart username and password.  Masks are optional in the cancer centers. If you would like for your care team to wear a mask while they are taking care of you, please let them know. You may have one support person who is at least 86 years old accompany you for your appointments.

## 2022-05-22 ENCOUNTER — Ambulatory Visit: Payer: PPO | Admitting: Podiatry

## 2022-05-23 NOTE — Progress Notes (Signed)
HEMATOLOGY/ONCOLOGY CLINIC NOTE  Date of Service: 05/21/2022  Patient Care Team: Corrington, Delsa Grana, MD as PCP - General (Family Medicine) Malmfelt, Stephani Police, RN as Oncology Nurse Navigator Eppie Gibson, MD as Consulting Physician (Radiation Oncology)  CHIEF COMPLAINTS/PURPOSE OF CONSULTATION:  Follow-up for continued evaluation and management of metastatic melanoma  HISTORY OF PRESENTING ILLNESS:  Please see previous note for details on initial presentation  INTERVAL HISTORY:  Thomas Zavala is a  88 y.o. male who returns for continued valuation and management of his metastatic melanoma. He reports He is doing well with no new symptoms or concerns.  He notes he is eating well.   No notable toxicities from his immunotherapy at this time. He is tolerating the Nivo3/Ipi1 well with no prohibitive toxicities. We discussed starting Imiquimod topical cream which he will apply 3x weekly to lesions and he is agreeable to this.  No fever, chills, night sweats. No new or unexpected weight loss. No other new or acute focal symptoms.  Labs done today were discussed in detail with the patient.  MEDICAL HISTORY:  Past Medical History:  Diagnosis Date   Abdominal hernia    BPH (benign prostatic hyperplasia) 07/25/2016   CLL (chronic lymphocytic leukemia) ([facility])    Heart murmur    History of radiation therapy    Face- 03/06/22-03/25/22-Dr. Gery Pray   Hypertension    Melanoma of forehead ([facility])    Melanoma of scalp ([facility])    PVC's (premature ventricular contractions)    Type 2 diabetes mellitus without complication, without long-term current use of insulin ([facility]) 07/25/2016    SURGICAL HISTORY: Past Surgical History:  Procedure Laterality Date   EYE SURGERY     eye lids   IRRIGATION AND DEBRIDEMENT OF WOUND WITH SPLIT THICKNESS SKIN GRAFT Left 01/05/2020   Procedure: SPLIT THICKNESS SKIN GRAFT Left shoulder;  Surgeon: Wallace Going, DO;  Location: [facility];  Service:  Plastics;  Laterality: Left;   MASS EXCISION N/A 01/05/2020   Procedure: EXCISION OF FOREHEAD  MELANOMA;  Surgeon: Wallace Going, DO;  Location: [facility];  Service: Plastics;  Laterality: N/A;   MELANOMA EXCISION  02/14/2021   Procedure: MELANOMA EXCISION of scalp;  Surgeon: Wallace Going, DO;  Location: [facility];  Service: Plastics;;   PAROTIDECTOMY Left 01/05/2020   Procedure: PAROTIDECTOMY;  Surgeon: Izora Gala, MD;  Location: [facility];  Service: ENT;  Laterality: Left;   PROSTATE SURGERY     SENTINEL NODE BIOPSY Left 01/05/2020   Procedure: Sentinel Node Biopsy;  Surgeon: Izora Gala, MD;  Location: [facility];  Service: ENT;  Laterality: Left;    SOCIAL HISTORY: Social History   Socioeconomic History   Marital status: Widowed    Spouse name: Not on file   Number of children: 3   Years of education: Not on file   Highest education level: Not on file  Occupational History   Not on file  Tobacco Use   Smoking status: Former    Packs/day: 1.00    Years: 25.00    Total pack years: 25.00    Types: Cigarettes    Quit date: 73    Years since quitting: 53.7   Smokeless tobacco: Never   Tobacco comments:    quit in the 70's  Vaping Use   Vaping Use: Never used  Substance and Sexual Activity   Alcohol use: Yes    Alcohol/week: 1.0 standard drink of alcohol    Types: 1 Standard drinks or equivalent per week  Comment: socially.   Drug use: No   Sexual activity: Not on file  Other Topics Concern   Not on file  Social History Narrative   Not on file   Social Determinants of Health   Financial Resource Strain: Not on file  Food Insecurity: Not on file  Transportation Needs: No Transportation Needs (02/26/2022)   PRAPARE - Hydrologist (Medical): No    Lack of Transportation (Non-Medical): No  Physical Activity: Not on file  Stress: Not on file  Social Connections: Not on file  Intimate Partner Violence: Unknown (02/26/2022)   Humiliation,  Afraid, Rape, and Kick questionnaire    Fear of Current or Ex-Partner: No    Emotionally Abused: No    Physically Abused: No    Sexually Abused: Not on file    FAMILY HISTORY: Family History  Problem Relation Age of Onset   Hypertension Father     ALLERGIES:  is allergic to tape and fluorouracil.  MEDICATIONS:  Current Outpatient Medications  Medication Sig Dispense Refill   acetaminophen (TYLENOL) 500 MG tablet Take 500-1,000 mg by mouth every 6 (six) hours as needed (for pain).     carboxymethylcellul-glycerin (REFRESH OPTIVE) 0.5-0.9 % ophthalmic solution Place 1 drop into both eyes 3 (three) times daily as needed for dry eyes.     Dextromethorphan-guaiFENesin (MUCINEX DM PO) Take 1 Dose by mouth at bedtime as needed (cough, sleep).     finasteride (PROSCAR) 5 MG tablet Take 5 mg by mouth daily.     hydrALAZINE (APRESOLINE) 25 MG tablet TAKE 1 TABLET BY MOUTH THREE TIMES A DAY 270 tablet 1   imiquimod (ALDARA) 5 % cream Apply topically 3 (three) times a week. To melanoma lesions on the scalp. Clean and dry skin, then use a thin layer on area. Wash off after treatment with mild soap & water. Avoid contact with eyes, nose, mouth. Skin may be more prone to sunburn. 12 each 0   loratadine (CLARITIN) 10 MG tablet Take 10 mg by mouth daily as needed for allergies or rhinitis.     losartan-hydrochlorothiazide (HYZAAR) 100-25 MG tablet Take 1 tablet by mouth daily.      metFORMIN (GLUCOPHAGE-XR) 500 MG 24 hr tablet Take 500 mg by mouth daily with breakfast.     ondansetron (ZOFRAN) 8 MG tablet Take 1 tablet (8 mg total) by mouth every 8 (eight) hours as needed for nausea or vomiting. 30 tablet 1   PFIZER COVID-19 VAC BIVALENT injection      pindolol (VISKEN) 5 MG tablet TAKE 1 TABLET BY MOUTH TWICE A DAY 180 tablet 1   pindolol (VISKEN) 5 MG tablet Take by mouth.     pravastatin (PRAVACHOL) 40 MG tablet Take 40 mg by mouth daily.      No current facility-administered medications for this  visit.    REVIEW OF SYSTEMS:  10 Point review of Systems was done is negative except as noted above.  PHYSICAL EXAMINATION: ECOG PERFORMANCE STATUS: 2 - Symptomatic, <50% confined to bed  . Vitals:   05/21/22 1319  BP: (!) 124/56  Pulse: 72  Resp: 15  Temp: 98.3 F (36.8 C)  SpO2: 98%    Filed Weights   05/21/22 1319  Weight: 167 lb 12.8 oz (76.1 kg)    Body mass index is 22.76 kg/m.  NAD GENERAL:alert, in no acute distress and comfortable SKIN: no acute rashes, no significant lesions EYES: conjunctiva are pink and non-injected, sclera anicteric NECK: supple, no  JVD LYMPH:  no palpable lymphadenopathy in the cervical, axillary or inguinal regions LUNGS: clear to auscultation b/l with normal respiratory effort HEART: regular rate & rhythm ABDOMEN:  normoactive bowel sounds , non tender, not distended. Extremity: no pedal edema PSYCH: alert & oriented x 3 with fluent speech NEURO: no focal motor/sensory deficits  Exam performed in chair.  LABORATORY DATA:  I have reviewed the data as listed  .    Latest Ref Rng & Units 05/21/2022    1:06 PM 04/23/2022    9:03 AM 04/03/2022    9:29 AM  CBC  WBC 4.0 - 10.5 K/uL 90.6  75.3  79.1   Hemoglobin 13.0 - 17.0 g/dL 10.4  10.9  10.3   Hematocrit 39.0 - 52.0 % 32.8  32.8  31.5   Platelets 150 - 400 K/uL 151  76  97     .    Latest Ref Rng & Units 05/21/2022    1:06 PM 04/23/2022    9:03 AM 04/03/2022    9:29 AM  CMP  Glucose 70 - 99 mg/dL 125  146  158   BUN 8 - 23 mg/dL 39  20  19   Creatinine 0.61 - 1.24 mg/dL 1.42  1.05  1.05   Sodium 135 - 145 mmol/L 136  133  135   Potassium 3.5 - 5.1 mmol/L 4.5  3.9  3.7   Chloride 98 - 111 mmol/L 101  98  101   CO2 22 - 32 mmol/L '30  29  30   '$ Calcium 8.9 - 10.3 mg/dL 9.2  9.4  9.3   Total Protein 6.5 - 8.1 g/dL 6.0  6.3  6.4   Total Bilirubin 0.3 - 1.2 mg/dL 0.5  0.8  0.5   Alkaline Phos 38 - 126 U/L 113  107  106   AST 15 - 41 U/L '20  23  19   '$ ALT 0 - 44 U/L '10  10  11     '$ . Lab Results  Component Value Date   LDH 186 04/23/2022    07/15/16 Cytogenetics and Flow Cytometry:   11/12/19 of Molecular Pathology   01/05/20 of Surgical Pathology Report [id number])  RADIOGRAPHIC STUDIES: I have personally reviewed the radiological images as listed and agreed with the findings in the report. No results found.   ASSESSMENT & PLAN:  88 y.o. male with  1. Chronic Lymphocytic Leukemia Diagnosed in November 2017 with flow cytometry 07/15/16 Cytogenetics revealed Trisomy 12  09/03/16 CT A/P revealed Borderline splenomegaly with 1.3 cm low-attenuation lesion in the anterior aspect of spleen. Mild upper abdominal and right iliac lymphadenopathy. 4 mm indeterminate pulmonary nodule in right lung base. Recommend continued attention on follow-up CT. Incidentally noted benign right hepatic lobe hemangioma, mildly and enlarged prostate, and colonic diverticulosis  10/10/17 CT Chest revealed Scattered small bilateral pulmonary nodules, including two dominant right middle lobe nodules measuring 5-6 mm, unchanged, likely benign. If clinically warranted, consider a single follow-up CT chest in 1 year. Mild mediastinal and bilateral hilar lymphadenopathy, likely related to the patient's known CLL. Mild splenomegaly, incompletely visualized. Aortic Atherosclerosis and Emphysema  2. 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  patient declined adjuvant immunotheraphy Now with multiple cutaneous metastases from melanoma over the scalp as well as left cervical lymph node metastasis based on PET scan.  On 12/20/2021.  MRI brain shows chronic meningioma but no evidence of metastatic melanoma to the  brain.  PLAN: -Patient's labs done today were discussed in detail with the patient. -No significant new symptoms since last treatment. -He is tolerating the Nivo3/Ipi1 well with no prohibitive toxicities. We discussed  starting Imiquimod topical cream which he will apply 3x weekly to lesions. Orders signed. -still has option for palliative RT if needed for progressive lesions.  -FOLLOW UP: Will schedule next 2 cycles of immunotherapy  The total time spent in the appointment was 30 minutes*.  All of the patient's questions were answered with apparent satisfaction. The patient knows to call the clinic with any problems, questions or concerns.   Sullivan Lone MD MS AAHIVMS [facility] Hematology/Oncology Physician [facility]  .*Total Encounter Time as defined by the Centers for Medicare and Medicaid Services includes, in addition to the face-to-face time of a patient visit (documented in the note above) non-face-to-face time: obtaining and reviewing outside history, ordering and reviewing medications, tests or procedures, care coordination (communications with other health care professionals or caregivers) and documentation in the medical record.  I, Melene Muller, am acting as scribe for Dr. Sullivan Lone, MD.  .I have reviewed the above documentation for accuracy and completeness, and I agree with the above. Brunetta Genera MD

## 2022-05-27 ENCOUNTER — Encounter: Payer: Self-pay | Admitting: Hematology

## 2022-06-03 ENCOUNTER — Other Ambulatory Visit: Payer: Self-pay | Admitting: Hematology

## 2022-06-03 DIAGNOSIS — C434 Malignant melanoma of scalp and neck: Secondary | ICD-10-CM

## 2022-06-03 DIAGNOSIS — C439 Malignant melanoma of skin, unspecified: Secondary | ICD-10-CM

## 2022-06-07 ENCOUNTER — Telehealth: Payer: Self-pay

## 2022-06-07 NOTE — Telephone Encounter (Signed)
error 

## 2022-06-11 ENCOUNTER — Inpatient Hospital Stay (HOSPITAL_BASED_OUTPATIENT_CLINIC_OR_DEPARTMENT_OTHER): Payer: PPO | Admitting: Hematology

## 2022-06-11 ENCOUNTER — Other Ambulatory Visit: Payer: Self-pay | Admitting: Hematology

## 2022-06-11 ENCOUNTER — Other Ambulatory Visit: Payer: Self-pay

## 2022-06-11 ENCOUNTER — Inpatient Hospital Stay: Payer: PPO

## 2022-06-11 VITALS — BP 133/60 | HR 75 | Temp 98.1°F | Resp 16 | Wt 166.8 lb

## 2022-06-11 DIAGNOSIS — C434 Malignant melanoma of scalp and neck: Secondary | ICD-10-CM

## 2022-06-11 DIAGNOSIS — Z5112 Encounter for antineoplastic immunotherapy: Secondary | ICD-10-CM | POA: Diagnosis not present

## 2022-06-11 DIAGNOSIS — C439 Malignant melanoma of skin, unspecified: Secondary | ICD-10-CM

## 2022-06-11 LAB — CMP (CANCER CENTER ONLY)
ALT: 13 U/L (ref 0–44)
AST: 28 U/L (ref 15–41)
Albumin: 3.7 g/dL (ref 3.5–5.0)
Alkaline Phosphatase: 107 U/L (ref 38–126)
Anion gap: 7 (ref 5–15)
BUN: 23 mg/dL (ref 8–23)
CO2: 28 mmol/L (ref 22–32)
Calcium: 9.1 mg/dL (ref 8.9–10.3)
Chloride: 99 mmol/L (ref 98–111)
Creatinine: 1.17 mg/dL (ref 0.61–1.24)
GFR, Estimated: 59 mL/min — ABNORMAL LOW (ref >60)
Glucose, Bld: 153 mg/dL — ABNORMAL HIGH (ref 70–99)
Potassium: 4.6 mmol/L (ref 3.5–5.1)
Sodium: 134 mmol/L — ABNORMAL LOW (ref 135–145)
Total Bilirubin: 0.6 mg/dL (ref 0.3–1.2)
Total Protein: 6.2 g/dL — ABNORMAL LOW (ref 6.5–8.1)

## 2022-06-11 LAB — CBC WITH DIFFERENTIAL (CANCER CENTER ONLY)
Abs Immature Granulocytes: 0.16 10*3/uL — ABNORMAL HIGH (ref 0.00–0.07)
Basophils Absolute: 0.1 10*3/uL (ref 0.0–0.1)
Basophils Relative: 0 %
Eosinophils Absolute: 0.1 10*3/uL (ref 0.0–0.5)
Eosinophils Relative: 0 %
HCT: 31.1 % — ABNORMAL LOW (ref 39.0–52.0)
Hemoglobin: 9.8 g/dL — ABNORMAL LOW (ref 13.0–17.0)
Immature Granulocytes: 0 %
Lymphocytes Relative: 92 %
Lymphs Abs: 94.9 10*3/uL — ABNORMAL HIGH (ref 0.7–4.0)
MCH: 31.3 pg (ref 26.0–34.0)
MCHC: 31.5 g/dL (ref 30.0–36.0)
MCV: 99.4 fL (ref 80.0–100.0)
Monocytes Absolute: 4.8 10*3/uL — ABNORMAL HIGH (ref 0.1–1.0)
Monocytes Relative: 5 %
Neutro Abs: 3.2 10*3/uL (ref 1.7–7.7)
Neutrophils Relative %: 3 %
Platelet Count: 152 10*3/uL (ref 150–400)
RBC: 3.13 MIL/uL — ABNORMAL LOW (ref 4.22–5.81)
RDW: 15 % (ref 11.5–15.5)
Smear Review: NORMAL
WBC Count: 103.2 10*3/uL (ref 4.0–10.5)
nRBC: 0 % (ref 0.0–0.2)

## 2022-06-11 LAB — TSH: TSH: 2.215 u[IU]/mL (ref 0.350–4.500)

## 2022-06-11 MED ORDER — DIPHENHYDRAMINE HCL 50 MG/ML IJ SOLN
25.0000 mg | Freq: Once | INTRAMUSCULAR | Status: AC
  Administered 2022-06-11: 25 mg via INTRAVENOUS
  Filled 2022-06-11: qty 1

## 2022-06-11 MED ORDER — SODIUM CHLORIDE 0.9 % IV SOLN
1.0000 mg/kg | Freq: Once | INTRAVENOUS | Status: AC
  Administered 2022-06-11: 80 mg via INTRAVENOUS
  Filled 2022-06-11: qty 16

## 2022-06-11 MED ORDER — FAMOTIDINE IN NACL 20-0.9 MG/50ML-% IV SOLN
20.0000 mg | Freq: Once | INTRAVENOUS | Status: AC
  Administered 2022-06-11: 20 mg via INTRAVENOUS
  Filled 2022-06-11: qty 50

## 2022-06-11 MED ORDER — SODIUM CHLORIDE 0.9 % IV SOLN
3.0100 mg/kg | Freq: Once | INTRAVENOUS | Status: AC
  Administered 2022-06-11: 240 mg via INTRAVENOUS
  Filled 2022-06-11: qty 24

## 2022-06-11 MED ORDER — SODIUM CHLORIDE 0.9 % IV SOLN: Freq: Once | INTRAVENOUS | Status: AC

## 2022-06-11 NOTE — Patient Instructions (Signed)
[facility]   Discharge Instructions: Thank you for choosing [facility] to provide your oncology and hematology care.   If you have a lab appointment with the Cancer Center, please go directly to the Cancer Center and check in at the registration area.   Wear comfortable clothing and clothing appropriate for easy access to any Portacath or PICC line.   We strive to give you quality time with your provider. You may need to reschedule your appointment if you arrive late (15 or more minutes).  Arriving late affects you and other patients whose appointments are after yours.  Also, if you miss three or more appointments without notifying the office, you may be dismissed from the clinic at the provider's discretion.      For prescription refill requests, have your pharmacy contact our office and allow 72 hours for refills to be completed.    Today you received the following chemotherapy and/or immunotherapy agents: Nivolumab and Ipilimumab      To help prevent nausea and vomiting after your treatment, we encourage you to take your nausea medication as directed.  BELOW ARE SYMPTOMS THAT SHOULD BE REPORTED IMMEDIATELY: *FEVER GREATER THAN 100.4 F (38 C) OR HIGHER *CHILLS OR SWEATING *NAUSEA AND VOMITING THAT IS NOT CONTROLLED WITH YOUR NAUSEA MEDICATION *UNUSUAL SHORTNESS OF BREATH *UNUSUAL BRUISING OR BLEEDING *URINARY PROBLEMS (pain or burning when urinating, or frequent urination) *BOWEL PROBLEMS (unusual diarrhea, constipation, pain near the anus) TENDERNESS IN MOUTH AND THROAT WITH OR WITHOUT PRESENCE OF ULCERS (sore throat, sores in mouth, or a toothache) UNUSUAL RASH, SWELLING OR PAIN  UNUSUAL VAGINAL DISCHARGE OR ITCHING   Items with * indicate a potential emergency and should be followed up as soon as possible or go to the Emergency Department if any problems should occur.  Please show the CHEMOTHERAPY ALERT CARD or IMMUNOTHERAPY ALERT  CARD at check-in to the Emergency Department and triage nurse.  Should you have questions after your visit or need to cancel or reschedule your appointment, please contact [facility]  Dept: [phone]  and follow the prompts.  Office hours are 8:00 a.m. to 4:30 p.m. Monday - Friday. Please note that voicemails left after 4:00 p.m. may not be returned until the following business day.  We are closed weekends and major holidays. You have access to a nurse at all times for urgent questions. Please call the main number to the clinic Dept: [phone] and follow the prompts.   For any non-urgent questions, you may also contact your provider using MyChart. We now offer e-Visits for anyone 18 and older to request care online for non-urgent symptoms. For details visit [email or web address].   Also download the MyChart app! Go to the app store, search "MyChart", open the app, select [facility], and log in with your MyChart username and password.  Masks are optional in the cancer centers. If you would like for your care team to wear a mask while they are taking care of you, please let them know. You may have one support person who is at least 86 years old accompany you for your appointments. 

## 2022-06-11 NOTE — Progress Notes (Unsigned)
Ipilimumab (YERVOY) Patient Monitoring Assessment   Is the patient experiencing any of the following general symptoms?:  '[ ]'$ Difficulty performing normal activities N '[ ]'$ Feeling sluggish or cold all the time N '[ ]'$ Unusual weight gain N '[ ]'$ Constant or unusual headaches N '[ ]'$ Feeling dizzy or faint N '[ ]'$ Changes in eyesight (blurry vision, double vision, or other vision problems) POSSIBLY A LITTLE BLURRY '[ ]'$ Changes in mood or behavior (ex: decreased sex drive, irritability, or forgetfulness) N '[ ]'$ Starting new medications (ex: steroids, other medications that lower immune response) N '[ ]'$ Patient is not experiencing any of the general symptoms above. N  Gastrointestinal  Patient is having  bowel movements each day. EVERY 3-4 DAYS Is this different from baseline? [ x]Yes '[]'$ No Are your stools watery or do they have a foul smell? '[ ]'$ Yes [ X]No Have you seen blood in your stools? '[ ]'$ Yes [ X]No Are your stools dark, tarry, or sticky? '[ ]'$ Yes [X ]No Are you having pain or tenderness in your belly? '[ ]'$ Yes [ X]No  Skin Does your skin itch? '[ ]'$ Yes [ X]No Do you have a rash? '[ ]'$ Yes [ X]No Has your skin blistered and/or peeled? '[ ]'$ Yes [X ]No Do you have sores in your mouth? '[ ]'$ Yes [X ]No  Hepatic Has your urine been dark or tea colored? '[ ]'$ Yes [ X]No Have you noticed that your skin or the whites of your eyes are turning yellow? '[ ]'$ Yes [ X]No Are you bleeding or bruising more easily than normal? '[ ]'$ Yes [ X]No Are you nauseous and/or vomiting? '[ ]'$ Yes [X ]No Do you have pain on the right side of your stomach? '[ ]'$ Yes [ X]No  Neurologic  Are you having unusual weakness of legs, arms, or face? '[ ]'$ Yes [ X]No Are you having numbness or tingling in your hands or feet? '[ ]'$ Yes [ X]No  [facility] Badger for treatment with the above symptoms per Dr. Irene Limbo.

## 2022-06-12 LAB — T4: T4, Total: 7.5 ug/dL (ref 4.5–12.0)

## 2022-06-17 ENCOUNTER — Encounter: Payer: Self-pay | Admitting: Hematology

## 2022-06-17 NOTE — Progress Notes (Signed)
HEMATOLOGY/ONCOLOGY CLINIC NOTE  Date of Service: 05/21/2022  Patient Care Team: Corrington, Delsa Grana, MD as PCP - General (Family Medicine) Malmfelt, Stephani Police, RN as Oncology Nurse Navigator Eppie Gibson, MD as Consulting Physician (Radiation Oncology)  CHIEF COMPLAINTS/PURPOSE OF CONSULTATION:  Follow-up for continued evaluation and management of metastatic melanoma  HISTORY OF PRESENTING ILLNESS:  Please see previous note for details on initial presentation  INTERVAL HISTORY:   Thomas Zavala is a  88 y.o. male who is here for continued valuation and management of his metastatic melanoma.  He notes some grade 1 through 2 fatigue from his previous immunotherapy but no other acute new symptoms. He has not really been using his imiquimod topical therapies. Does note some swelling in his right upper neck with a new enlarged lymph node.  No shortness of breath or chest pain.  No other abdominal pain or distention.. In good spirits. Lab results discussed in detail with him.[name]   MEDICAL HISTORY:  Past Medical History:  Diagnosis Date   Abdominal hernia    BPH (benign prostatic hyperplasia) 07/25/2016   CLL (chronic lymphocytic leukemia) ([place])    Heart murmur    History of radiation therapy    Face- 03/06/22-03/25/22-Dr. Gery Pray   Hypertension    Melanoma of forehead ([place])    Melanoma of scalp ([place])    PVC's (premature ventricular contractions)    Type 2 diabetes mellitus without complication, without long-term current use of insulin ([place]) 07/25/2016    SURGICAL HISTORY: Past Surgical History:  Procedure Laterality Date   EYE SURGERY     eye lids   IRRIGATION AND DEBRIDEMENT OF WOUND WITH SPLIT THICKNESS SKIN GRAFT Left 01/05/2020   Procedure: SPLIT THICKNESS SKIN GRAFT Left shoulder;  Surgeon: Wallace Going, DO;  Location: [place];  Service: Plastics;  Laterality: Left;   MASS EXCISION N/A 01/05/2020   Procedure: EXCISION OF FOREHEAD  MELANOMA;  Surgeon:  Wallace Going, DO;  Location: [place];  Service: Plastics;  Laterality: N/A;   MELANOMA EXCISION  02/14/2021   Procedure: MELANOMA EXCISION of scalp;  Surgeon: Wallace Going, DO;  Location: [place];  Service: Plastics;;   PAROTIDECTOMY Left 01/05/2020   Procedure: PAROTIDECTOMY;  Surgeon: Izora Gala, MD;  Location: [place];  Service: ENT;  Laterality: Left;   PROSTATE SURGERY     SENTINEL NODE BIOPSY Left 01/05/2020   Procedure: Sentinel Node Biopsy;  Surgeon: Izora Gala, MD;  Location: [facility];  Service: ENT;  Laterality: Left;    SOCIAL HISTORY: Social History   Socioeconomic History   Marital status: Widowed    Spouse name: Not on file   Number of children: 3   Years of education: Not on file   Highest education level: Not on file  Occupational History   Not on file  Tobacco Use   Smoking status: Former    Packs/day: 1.00    Years: 25.00    Total pack years: 25.00    Types: Cigarettes    Quit date: 61    Years since quitting: 53.8   Smokeless tobacco: Never   Tobacco comments:    quit in the 70's  Vaping Use   Vaping Use: Never used  Substance and Sexual Activity   Alcohol use: Yes    Alcohol/week: 1.0 standard drink of alcohol    Types: 1 Standard drinks or equivalent per week    Comment: socially.   Drug use: No   Sexual activity: Not on file  Other Topics Concern   Not on file  Social History Narrative   Not on file   Social Determinants of Health   Financial Resource Strain: Not on file  Food Insecurity: Not on file  Transportation Needs: No Transportation Needs (02/26/2022)   PRAPARE - Hydrologist (Medical): No    Lack of Transportation (Non-Medical): No  Physical Activity: Not on file  Stress: Not on file  Social Connections: Not on file  Intimate Partner Violence: Unknown (02/26/2022)   Humiliation, Afraid, Rape, and Kick questionnaire    Fear of Current or Ex-Partner: No    Emotionally Abused: No    Physically  Abused: No    Sexually Abused: Not on file    FAMILY HISTORY: Family History  Problem Relation Age of Onset   Hypertension Father     ALLERGIES:  is allergic to tape and fluorouracil.  MEDICATIONS:  Current Outpatient Medications  Medication Sig Dispense Refill   acetaminophen (TYLENOL) 500 MG tablet Take 500-1,000 mg by mouth every 6 (six) hours as needed (for pain).     carboxymethylcellul-glycerin (REFRESH OPTIVE) 0.5-0.9 % ophthalmic solution Place 1 drop into both eyes 3 (three) times daily as needed for dry eyes.     Dextromethorphan-guaiFENesin (MUCINEX DM PO) Take 1 Dose by mouth at bedtime as needed (cough, sleep).     finasteride (PROSCAR) 5 MG tablet Take 5 mg by mouth daily.     hydrALAZINE (APRESOLINE) 25 MG tablet TAKE 1 TABLET BY MOUTH THREE TIMES A DAY 270 tablet 1   imiquimod (ALDARA) 5 % cream Apply topically 3 (three) times a week. To melanoma lesions on the scalp. Clean and dry skin, then use a thin layer on area. Wash off after treatment with mild soap & water. Avoid contact with eyes, nose, mouth. Skin may be more prone to sunburn. 12 each 0   loratadine (CLARITIN) 10 MG tablet Take 10 mg by mouth daily as needed for allergies or rhinitis.     losartan-hydrochlorothiazide (HYZAAR) 100-25 MG tablet Take 1 tablet by mouth daily.      metFORMIN (GLUCOPHAGE-XR) 500 MG 24 hr tablet Take 500 mg by mouth daily with breakfast.     ondansetron (ZOFRAN) 8 MG tablet Take 1 tablet (8 mg total) by mouth every 8 (eight) hours as needed for nausea or vomiting. 30 tablet 1   PFIZER COVID-19 VAC BIVALENT injection      pindolol (VISKEN) 5 MG tablet TAKE 1 TABLET BY MOUTH TWICE A DAY 180 tablet 1   pindolol (VISKEN) 5 MG tablet Take by mouth.     pravastatin (PRAVACHOL) 40 MG tablet Take 40 mg by mouth daily.      No current facility-administered medications for this visit.    REVIEW OF SYSTEMS:  10 Point review of Systems was done is negative except as noted above.  PHYSICAL  EXAMINATION: ECOG PERFORMANCE STATUS: 2 - Symptomatic, <50% confined to bed Vital signs reviewed NAD . GENERAL:alert, in no acute distress and comfortable SKIN: Multiple hyperpigmented lesions on forehead top of the scalp EYES: conjunctiva are pink and non-injected, sclera anicteric OROPHARYNX: MMM, no exudates, no oropharyngeal erythema or ulceration NECK: supple, no JVD LYMPH: Bilateral palpable upper cervical lymphadenopathy mildly LUNGS: clear to auscultation b/l with normal respiratory effort HEART: regular rate & rhythm ABDOMEN:  normoactive bowel sounds , non tender, not distended. Extremity: no pedal edema PSYCH: alert & oriented x 3 with fluent speech NEURO: no focal motor/sensory deficits Exam performed  in chair.  LABORATORY DATA:  I have reviewed the data as listed  .    Latest Ref Rng & Units 06/11/2022    1:04 PM 05/21/2022    1:06 PM 04/23/2022    9:03 AM  CBC  WBC 4.0 - 10.5 K/uL 103.2  90.6  75.3   Hemoglobin 13.0 - 17.0 g/dL 9.8  10.4  10.9   Hematocrit 39.0 - 52.0 % 31.1  32.8  32.8   Platelets 150 - 400 K/uL 152  151  76     .    Latest Ref Rng & Units 06/11/2022    1:04 PM 05/21/2022    1:06 PM 04/23/2022    9:03 AM  CMP  Glucose 70 - 99 mg/dL 153  125  146   BUN 8 - 23 mg/dL 23  39  20   Creatinine 0.61 - 1.24 mg/dL 1.17  1.42  1.05   Sodium 135 - 145 mmol/L 134  136  133   Potassium 3.5 - 5.1 mmol/L 4.6  4.5  3.9   Chloride 98 - 111 mmol/L 99  101  98   CO2 22 - 32 mmol/L '28  30  29   '$ Calcium 8.9 - 10.3 mg/dL 9.1  9.2  9.4   Total Protein 6.5 - 8.1 g/dL 6.2  6.0  6.3   Total Bilirubin 0.3 - 1.2 mg/dL 0.6  0.5  0.8   Alkaline Phos 38 - 126 U/L 107  113  107   AST 15 - 41 U/L '28  20  23   '$ ALT 0 - 44 U/L '13  10  10    '$ . Lab Results  Component Value Date   LDH 186 04/23/2022    07/15/16 Cytogenetics and Flow Cytometry:   11/12/19 of Molecular Pathology   01/05/20 of Surgical Pathology Report [id number])  RADIOGRAPHIC STUDIES: I have  personally reviewed the radiological images as listed and agreed with the findings in the report. No results found.   ASSESSMENT & PLAN:  88 y.o. male with  1. Chronic Lymphocytic Leukemia Diagnosed in November 2017 with flow cytometry 07/15/16 Cytogenetics revealed Trisomy 12  09/03/16 CT A/P revealed Borderline splenomegaly with 1.3 cm low-attenuation lesion in the anterior aspect of spleen. Mild upper abdominal and right iliac lymphadenopathy. 4 mm indeterminate pulmonary nodule in right lung base. Recommend continued attention on follow-up CT. Incidentally noted benign right hepatic lobe hemangioma, mildly and enlarged prostate, and colonic diverticulosis  10/10/17 CT Chest revealed Scattered small bilateral pulmonary nodules, including two dominant right middle lobe nodules measuring 5-6 mm, unchanged, likely benign. If clinically warranted, consider a single follow-up CT chest in 1 year. Mild mediastinal and bilateral hilar lymphadenopathy, likely related to the patient's known CLL. Mild splenomegaly, incompletely visualized. Aortic Atherosclerosis and Emphysema  2. 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  patient declined adjuvant immunotheraphy Now with multiple cutaneous metastases from melanoma over the scalp as well as left cervical lymph node metastasis based on PET scan.  On 12/20/2021.  MRI brain shows chronic meningioma but no evidence of metastatic melanoma to the brain.  PLAN: -Patient's labs done today were discussed in detail with him. -Clinically has some increasing right upper cervical lymphadenopathy.  Some of his cutaneous lesions are crusting over on the scalp. -Grade 1-2 fatigue related to her ipilimumab/nivolumab. -Patient appropriate to proceed with third cycle of ipilimumab/nivolumab today. -He was also encouraged to start using his  topical imiquimod 3 times a week to his scalp lesions. -We will plan to  repeat imaging after 4 cycles of ipilimumab/nivolumab. -Chemotherapy orders reviewed and signed. -still has option for palliative RT if needed for progressive lesions.  -FOLLOW UP: Will schedule next 2 cycles of immunotherapy   The total time spent in the appointment was 20 minutes*.  All of the patient's questions were answered with apparent satisfaction. The patient knows to call the clinic with any problems, questions or concerns.   Sullivan Lone MD MS AAHIVMS [facility] Hematology/Oncology Physician [facility]  .*Total Encounter Time as defined by the Centers for Medicare and Medicaid Services includes, in addition to the face-to-face time of a patient visit (documented in the note above) non-face-to-face time: obtaining and reviewing outside history, ordering and reviewing medications, tests or procedures, care coordination (communications with other health care professionals or caregivers) and documentation in the medical record.

## 2022-07-02 ENCOUNTER — Other Ambulatory Visit: Payer: Self-pay

## 2022-07-02 ENCOUNTER — Inpatient Hospital Stay: Payer: PPO | Admitting: Hematology

## 2022-07-02 ENCOUNTER — Inpatient Hospital Stay: Payer: PPO

## 2022-07-02 ENCOUNTER — Inpatient Hospital Stay: Payer: PPO | Attending: Hematology

## 2022-07-02 VITALS — BP 125/54 | HR 70 | Temp 97.5°F | Resp 15 | Wt 162.4 lb

## 2022-07-02 DIAGNOSIS — C439 Malignant melanoma of skin, unspecified: Secondary | ICD-10-CM | POA: Diagnosis not present

## 2022-07-02 DIAGNOSIS — Z87891 Personal history of nicotine dependence: Secondary | ICD-10-CM | POA: Diagnosis not present

## 2022-07-02 DIAGNOSIS — C434 Malignant melanoma of scalp and neck: Secondary | ICD-10-CM | POA: Diagnosis not present

## 2022-07-02 DIAGNOSIS — Z923 Personal history of irradiation: Secondary | ICD-10-CM | POA: Diagnosis not present

## 2022-07-02 DIAGNOSIS — E119 Type 2 diabetes mellitus without complications: Secondary | ICD-10-CM | POA: Diagnosis not present

## 2022-07-02 DIAGNOSIS — Z79899 Other long term (current) drug therapy: Secondary | ICD-10-CM | POA: Diagnosis not present

## 2022-07-02 DIAGNOSIS — Z5112 Encounter for antineoplastic immunotherapy: Secondary | ICD-10-CM | POA: Diagnosis not present

## 2022-07-02 DIAGNOSIS — Z7984 Long term (current) use of oral hypoglycemic drugs: Secondary | ICD-10-CM | POA: Diagnosis not present

## 2022-07-02 DIAGNOSIS — C7989 Secondary malignant neoplasm of other specified sites: Secondary | ICD-10-CM | POA: Diagnosis not present

## 2022-07-02 DIAGNOSIS — C9110 Chronic lymphocytic leukemia of B-cell type not having achieved remission: Secondary | ICD-10-CM | POA: Diagnosis present

## 2022-07-02 DIAGNOSIS — C4339 Malignant melanoma of other parts of face: Secondary | ICD-10-CM | POA: Insufficient documentation

## 2022-07-02 LAB — CBC WITH DIFFERENTIAL (CANCER CENTER ONLY)
Abs Immature Granulocytes: 0.07 10*3/uL (ref 0.00–0.07)
Basophils Absolute: 0.1 10*3/uL (ref 0.0–0.1)
Basophils Relative: 0 %
Eosinophils Absolute: 0.1 10*3/uL (ref 0.0–0.5)
Eosinophils Relative: 0 %
HCT: 30.4 % — ABNORMAL LOW (ref 39.0–52.0)
Hemoglobin: 9.6 g/dL — ABNORMAL LOW (ref 13.0–17.0)
Immature Granulocytes: 0 %
Lymphocytes Relative: 90 %
Lymphs Abs: 66.8 10*3/uL — ABNORMAL HIGH (ref 0.7–4.0)
MCH: 31.5 pg (ref 26.0–34.0)
MCHC: 31.6 g/dL (ref 30.0–36.0)
MCV: 99.7 fL (ref 80.0–100.0)
Monocytes Absolute: 5 10*3/uL — ABNORMAL HIGH (ref 0.1–1.0)
Monocytes Relative: 7 %
Neutro Abs: 2 10*3/uL (ref 1.7–7.7)
Neutrophils Relative %: 3 %
Platelet Count: 120 10*3/uL — ABNORMAL LOW (ref 150–400)
RBC: 3.05 MIL/uL — ABNORMAL LOW (ref 4.22–5.81)
RDW: 15.6 % — ABNORMAL HIGH (ref 11.5–15.5)
Smear Review: NORMAL
WBC Count: 74 10*3/uL (ref 4.0–10.5)
nRBC: 0 % (ref 0.0–0.2)

## 2022-07-02 LAB — CMP (CANCER CENTER ONLY)
ALT: 13 U/L (ref 0–44)
AST: 47 U/L — ABNORMAL HIGH (ref 15–41)
Albumin: 4 g/dL (ref 3.5–5.0)
Alkaline Phosphatase: 117 U/L (ref 38–126)
Anion gap: 7 (ref 5–15)
BUN: 34 mg/dL — ABNORMAL HIGH (ref 8–23)
CO2: 28 mmol/L (ref 22–32)
Calcium: 9.2 mg/dL (ref 8.9–10.3)
Chloride: 100 mmol/L (ref 98–111)
Creatinine: 1.18 mg/dL (ref 0.61–1.24)
GFR, Estimated: 58 mL/min — ABNORMAL LOW (ref >60)
Glucose, Bld: 112 mg/dL — ABNORMAL HIGH (ref 70–99)
Potassium: 4.1 mmol/L (ref 3.5–5.1)
Sodium: 135 mmol/L (ref 135–145)
Total Bilirubin: 0.5 mg/dL (ref 0.3–1.2)
Total Protein: 6.4 g/dL — ABNORMAL LOW (ref 6.5–8.1)

## 2022-07-02 MED ORDER — SODIUM CHLORIDE 0.9 % IV SOLN
3.0100 mg/kg | Freq: Once | INTRAVENOUS | Status: AC
  Administered 2022-07-02: 240 mg via INTRAVENOUS
  Filled 2022-07-02: qty 24

## 2022-07-02 MED ORDER — SODIUM CHLORIDE 0.9 % IV SOLN: Freq: Once | INTRAVENOUS | Status: AC

## 2022-07-02 MED ORDER — SODIUM CHLORIDE 0.9 % IV SOLN
1.0000 mg/kg | Freq: Once | INTRAVENOUS | Status: AC
  Administered 2022-07-02: 80 mg via INTRAVENOUS
  Filled 2022-07-02: qty 16

## 2022-07-02 MED ORDER — DIPHENHYDRAMINE HCL 50 MG/ML IJ SOLN
25.0000 mg | Freq: Once | INTRAMUSCULAR | Status: AC
  Administered 2022-07-02: 25 mg via INTRAVENOUS
  Filled 2022-07-02: qty 1

## 2022-07-02 MED ORDER — FAMOTIDINE IN NACL 20-0.9 MG/50ML-% IV SOLN
20.0000 mg | Freq: Once | INTRAVENOUS | Status: AC
  Administered 2022-07-02: 20 mg via INTRAVENOUS
  Filled 2022-07-02: qty 50

## 2022-07-02 NOTE — Progress Notes (Signed)
HEMATOLOGY/ONCOLOGY CLINIC NOTE  Date of Service: 07/02/22  Patient Care Team: Corrington, Delsa Grana, MD as PCP - General (Family Medicine) Malmfelt, Stephani Police, RN as Oncology Nurse Navigator Eppie Gibson, MD as Consulting Physician (Radiation Oncology)  CHIEF COMPLAINTS/PURPOSE OF CONSULTATION:  Follow-up for continued evaluation and management of metastatic melanoma  HISTORY OF PRESENTING ILLNESS:  Please see previous note for details on initial presentation  INTERVAL HISTORY:   Thomas Zavala is a  88 y.o. male who is here for continued valuation and management of his metastatic melanoma.   Patient was last seen by me on 06/11/2022 and complained of grade 1 fatigue from his previous immunotherapy.   Patient is here with his daughter during today's visit.   He complains of new bumps/lesions on right side of his face. He complains of fatigue since 06/29/2022. He has more lesions on his scalp and face during today's visit. He does use Imiquimod 5% cream for his lesions on his scalp.   His daughter notes that he is losing weight and muscle mass due to loss of appetite.   He does not have any other problems/symptoms.   MEDICAL HISTORY:  Past Medical History:  Diagnosis Date   Abdominal hernia    BPH (benign prostatic hyperplasia) 07/25/2016   CLL (chronic lymphocytic leukemia) ([place])    Heart murmur    History of radiation therapy    Face- 03/06/22-03/25/22-Dr. Gery Pray   Hypertension    Melanoma of forehead ([place])    Melanoma of scalp ([place])    PVC's (premature ventricular contractions)    Type 2 diabetes mellitus without complication, without long-term current use of insulin ([place]) 07/25/2016    SURGICAL HISTORY: Past Surgical History:  Procedure Laterality Date   EYE SURGERY     eye lids   IRRIGATION AND DEBRIDEMENT OF WOUND WITH SPLIT THICKNESS SKIN GRAFT Left 01/05/2020   Procedure: SPLIT THICKNESS SKIN GRAFT Left shoulder;  Surgeon: Wallace Going, DO;   Location: [place];  Service: Plastics;  Laterality: Left;   MASS EXCISION N/A 01/05/2020   Procedure: EXCISION OF FOREHEAD  MELANOMA;  Surgeon: Wallace Going, DO;  Location: [place];  Service: Plastics;  Laterality: N/A;   MELANOMA EXCISION  02/14/2021   Procedure: MELANOMA EXCISION of scalp;  Surgeon: Wallace Going, DO;  Location: [place];  Service: Plastics;;   PAROTIDECTOMY Left 01/05/2020   Procedure: PAROTIDECTOMY;  Surgeon: Izora Gala, MD;  Location: [place];  Service: ENT;  Laterality: Left;   PROSTATE SURGERY     SENTINEL NODE BIOPSY Left 01/05/2020   Procedure: Sentinel Node Biopsy;  Surgeon: Izora Gala, MD;  Location: [facility];  Service: ENT;  Laterality: Left;    SOCIAL HISTORY: Social History   Socioeconomic History   Marital status: Widowed    Spouse name: Not on file   Number of children: 3   Years of education: Not on file   Highest education level: Not on file  Occupational History   Not on file  Tobacco Use   Smoking status: Former    Packs/day: 1.00    Years: 25.00    Total pack years: 25.00    Types: Cigarettes    Quit date: 54    Years since quitting: 53.9   Smokeless tobacco: Never   Tobacco comments:    quit in the 70's  Vaping Use   Vaping Use: Never used  Substance and Sexual Activity   Alcohol use: Yes    Alcohol/week: 1.0 standard  drink of alcohol    Types: 1 Standard drinks or equivalent per week    Comment: socially.   Drug use: No   Sexual activity: Not on file  Other Topics Concern   Not on file  Social History Narrative   Not on file   Social Determinants of Health   Financial Resource Strain: Not on file  Food Insecurity: Not on file  Transportation Needs: No Transportation Needs (02/26/2022)   PRAPARE - Hydrologist (Medical): No    Lack of Transportation (Non-Medical): No  Physical Activity: Not on file  Stress: Not on file  Social Connections: Not on file  Intimate Partner Violence: Unknown  (02/26/2022)   Humiliation, Afraid, Rape, and Kick questionnaire    Fear of Current or Ex-Partner: No    Emotionally Abused: No    Physically Abused: No    Sexually Abused: Not on file    FAMILY HISTORY: Family History  Problem Relation Age of Onset   Hypertension Father     ALLERGIES:  is allergic to tape and fluorouracil.  MEDICATIONS:  Current Outpatient Medications  Medication Sig Dispense Refill   acetaminophen (TYLENOL) 500 MG tablet Take 500-1,000 mg by mouth every 6 (six) hours as needed (for pain).     carboxymethylcellul-glycerin (REFRESH OPTIVE) 0.5-0.9 % ophthalmic solution Place 1 drop into both eyes 3 (three) times daily as needed for dry eyes.     Dextromethorphan-guaiFENesin (MUCINEX DM PO) Take 1 Dose by mouth at bedtime as needed (cough, sleep).     finasteride (PROSCAR) 5 MG tablet Take 5 mg by mouth daily.     hydrALAZINE (APRESOLINE) 25 MG tablet TAKE 1 TABLET BY MOUTH THREE TIMES A DAY 270 tablet 1   imiquimod (ALDARA) 5 % cream Apply topically 3 (three) times a week. To melanoma lesions on the scalp. Clean and dry skin, then use a thin layer on area. Wash off after treatment with mild soap & water. Avoid contact with eyes, nose, mouth. Skin may be more prone to sunburn. 12 each 0   loratadine (CLARITIN) 10 MG tablet Take 10 mg by mouth daily as needed for allergies or rhinitis.     losartan-hydrochlorothiazide (HYZAAR) 100-25 MG tablet Take 1 tablet by mouth daily.      metFORMIN (GLUCOPHAGE-XR) 500 MG 24 hr tablet Take 500 mg by mouth daily with breakfast.     ondansetron (ZOFRAN) 8 MG tablet Take 1 tablet (8 mg total) by mouth every 8 (eight) hours as needed for nausea or vomiting. 30 tablet 1   PFIZER COVID-19 VAC BIVALENT injection      pindolol (VISKEN) 5 MG tablet TAKE 1 TABLET BY MOUTH TWICE A DAY 180 tablet 1   pindolol (VISKEN) 5 MG tablet Take by mouth.     pravastatin (PRAVACHOL) 40 MG tablet Take 40 mg by mouth daily.      No current  facility-administered medications for this visit.    REVIEW OF SYSTEMS:  10 Point review of Systems was done is negative except as noted above.  PHYSICAL EXAMINATION: ECOG PERFORMANCE STATUS: 2 - Symptomatic, <50% confined to bed Vital signs reviewed NAD . GENERAL:alert, in no acute distress and comfortable SKIN: Multiple hyperpigmented lesions on forehead top of the scalp EYES: conjunctiva are pink and non-injected, sclera anicteric OROPHARYNX: MMM, no exudates, no oropharyngeal erythema or ulceration NECK: supple, no JVD LYMPH: Bilateral palpable upper cervical lymphadenopathy mildly LUNGS: clear to auscultation b/l with normal respiratory effort HEART: regular rate &  rhythm ABDOMEN:  normoactive bowel sounds , non tender, not distended. Extremity: no pedal edema PSYCH: alert & oriented x 3 with fluent speech NEURO: no focal motor/sensory deficits Exam performed in chair.  LABORATORY DATA:  I have reviewed the data as listed  .    Latest Ref Rng & Units 07/02/2022    1:18 PM 06/11/2022    1:04 PM 05/21/2022    1:06 PM  CBC  WBC 4.0 - 10.5 K/uL 74.0  103.2  90.6   Hemoglobin 13.0 - 17.0 g/dL 9.6  9.8  10.4   Hematocrit 39.0 - 52.0 % 30.4  31.1  32.8   Platelets 150 - 400 K/uL 120  152  151     .    Latest Ref Rng & Units 07/02/2022    1:18 PM 06/11/2022    1:04 PM 05/21/2022    1:06 PM  CMP  Glucose 70 - 99 mg/dL 112  153  125   BUN 8 - 23 mg/dL 34  23  39   Creatinine 0.61 - 1.24 mg/dL 1.18  1.17  1.42   Sodium 135 - 145 mmol/L 135  134  136   Potassium 3.5 - 5.1 mmol/L 4.1  4.6  4.5   Chloride 98 - 111 mmol/L 100  99  101   CO2 22 - 32 mmol/L '28  28  30   '$ Calcium 8.9 - 10.3 mg/dL 9.2  9.1  9.2   Total Protein 6.5 - 8.1 g/dL 6.4  6.2  6.0   Total Bilirubin 0.3 - 1.2 mg/dL 0.5  0.6  0.5   Alkaline Phos 38 - 126 U/L 117  107  113   AST 15 - 41 U/L 47  28  20   ALT 0 - 44 U/L '13  13  10    '$ . Lab Results  Component Value Date   LDH 186 04/23/2022     07/15/16 Cytogenetics and Flow Cytometry:   11/12/19 of Molecular Pathology   01/05/20 of Surgical Pathology Report [id number])  RADIOGRAPHIC STUDIES: I have personally reviewed the radiological images as listed and agreed with the findings in the report. No results found.   ASSESSMENT & PLAN:  88 y.o. male with  1. Chronic Lymphocytic Leukemia Diagnosed in November 2017 with flow cytometry 07/15/16 Cytogenetics revealed Trisomy 12  09/03/16 CT A/P revealed Borderline splenomegaly with 1.3 cm low-attenuation lesion in the anterior aspect of spleen. Mild upper abdominal and right iliac lymphadenopathy. 4 mm indeterminate pulmonary nodule in right lung base. Recommend continued attention on follow-up CT. Incidentally noted benign right hepatic lobe hemangioma, mildly and enlarged prostate, and colonic diverticulosis  10/10/17 CT Chest revealed Scattered small bilateral pulmonary nodules, including two dominant right middle lobe nodules measuring 5-6 mm, unchanged, likely benign. If clinically warranted, consider a single follow-up CT chest in 1 year. Mild mediastinal and bilateral hilar lymphadenopathy, likely related to the patient's known CLL. Mild splenomegaly, incompletely visualized. Aortic Atherosclerosis and Emphysema  2. 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  patient declined adjuvant immunotheraphy Now with multiple cutaneous metastases from melanoma over the scalp as well as left cervical lymph node metastasis based on PET scan.  On 12/20/2021.  MRI brain shows chronic meningioma but no evidence of metastatic melanoma to the brain.  PLAN: -Patient's labs done today were discussed in detail with him. Labs showed WBC of 74.0 K, hemoglobin of 9.6 K, and platelets of 120  K. CMP stable.  -Clinically has some increasing right upper cervical lymphadenopathy.  -Some of his cutaneous lesions are crusting over on the  scalp. -Discussed options for future: 1. Radiation with the dual agent immunotherapy until if there is organ threatening disease. Will have single agent immunotherapy after today if patient decides to continue his current dual agent immunotherapy.  2. Consult with Oncology at [place] for any clinical trials. 3. Switch from immunotherapy to interferon based on PET scan and Brain MRI. 4. Watch the lesions for now and radiate the ones that bleeds. 5. Palliative RT. -Patient wants to continue with the dual agent immunotherapy today and change to an single agent immunotherapy after today. Patient is also interested to get an PET scan and Brain MRI.  -Continue applying Imiquimod 5% cream.   -FOLLOW UP: PET/CT in 2-3 weeks MRI brain in 2-3 weeks Next dose of Nivolumab/labs and MD visit in 4 weeks  The total time spent in the appointment was 30 minutes* .  All of the patient's questions were answered with apparent satisfaction. The patient knows to call the clinic with any problems, questions or concerns.   Zettie Cooley, am acting as a scribe for Sullivan Lone, MD.  Sullivan Lone MD [place] AAHIVMS [facility] Hematology/Oncology Physician [facility]  .*Total Encounter Time as defined by the Centers for Medicare and Medicaid Services includes, in addition to the face-to-face time of a patient visit (documented in the note above) non-face-to-face time: obtaining and reviewing outside history, ordering and reviewing medications, tests or procedures, care coordination (communications with other health care professionals or caregivers) and documentation in the medical record.

## 2022-07-02 NOTE — Patient Instructions (Signed)
[facility]   Discharge Instructions: Thank you for choosing [facility] to provide your oncology and hematology care.   If you have a lab appointment with the [facility], please go directly to the [facility] and check in at the registration area.   Wear comfortable clothing and clothing appropriate for easy access to any Portacath or PICC line.   We strive to give you quality time with your provider. You may need to reschedule your appointment if you arrive late (15 or more minutes).  Arriving late affects you and other patients whose appointments are after yours.  Also, if you miss three or more appointments without notifying the office, you may be dismissed from the clinic at the provider's discretion.      For prescription refill requests, have your pharmacy contact our office and allow 72 hours for refills to be completed.    Today you received the following chemotherapy and/or immunotherapy agents: Nivolumab and Ipilimumab      To help prevent nausea and vomiting after your treatment, we encourage you to take your nausea medication as directed.  BELOW ARE SYMPTOMS THAT SHOULD BE REPORTED IMMEDIATELY: *FEVER GREATER THAN 100.4 F (38 C) OR HIGHER *CHILLS OR SWEATING *NAUSEA AND VOMITING THAT IS NOT CONTROLLED WITH YOUR NAUSEA MEDICATION *UNUSUAL SHORTNESS OF BREATH *UNUSUAL BRUISING OR BLEEDING *URINARY PROBLEMS (pain or burning when urinating, or frequent urination) *BOWEL PROBLEMS (unusual diarrhea, constipation, pain near the anus) TENDERNESS IN MOUTH AND THROAT WITH OR WITHOUT PRESENCE OF ULCERS (sore throat, sores in mouth, or a toothache) UNUSUAL RASH, SWELLING OR PAIN  UNUSUAL VAGINAL DISCHARGE OR ITCHING   Items with * indicate a potential emergency and should be followed up as soon as possible or go to the Emergency Department if any problems should occur.  Please show the CHEMOTHERAPY ALERT CARD or IMMUNOTHERAPY ALERT  CARD at check-in to the Emergency Department and triage nurse.  Should you have questions after your visit or need to cancel or reschedule your appointment, please contact [facility]  Dept: [phone]  and follow the prompts.  Office hours are 8:00 a.m. to 4:30 p.m. Monday - Friday. Please note that voicemails left after 4:00 p.m. may not be returned until the following business day.  We are closed weekends and major holidays. You have access to a nurse at all times for urgent questions. Please call the main number to the clinic Dept: [phone] and follow the prompts.   For any non-urgent questions, you may also contact your provider using MyChart. We now offer e-Visits for anyone 52 and older to request care online for non-urgent symptoms. For details visit [email or web address].   Also download the MyChart app! Go to the app store, search "MyChart", open the app, select [facility], and log in with your MyChart username and password.  Masks are optional in the cancer centers. If you would like for your care team to wear a mask while they are taking care of you, please let them know. You may have one support Thomas Zavala who is at least 86 years old accompany you for your appointments.

## 2022-07-02 NOTE — Progress Notes (Signed)
Patient seen by MD today  Vitals are within treatment parameters  Labs reviewed: ,"and are not all within treatment parameters.    Per physician team, patient is ready for treatment and there are NO modifications to the treatment plan.  WBC : 74 /MD aware

## 2022-07-02 NOTE — Progress Notes (Signed)
Ipilimumab (YERVOY) Patient Monitoring Assessment   Is the patient experiencing any of the following general symptoms?:  '[ ]'$ Difficulty performing normal activities '[X]'$ Feeling sluggish or cold all the time '[ ]'$ Unusual weight gain '[ ]'$ Constant or unusual headaches '[ ]'$ Feeling dizzy or faint '[X]'$ Changes in eyesight (blurry vision, double vision, or other vision problems) '[ ]'$ Changes in mood or behavior (ex: decreased sex drive, irritability, or forgetfulness) '[ ]'$ Starting new medications (ex: steroids, other medications that lower immune response) '[ ]'$ Patient is not experiencing any of the general symptoms above.   Gastrointestinal  Patient is having 0-1 bowel movements each day.  Is this different from baseline? '[ ]'$ Yes '[X]'$ No Are your stools watery or do they have a foul smell? '[ ]'$ Yes '[X]'$ No Have you seen blood in your stools? '[ ]'$ Yes '[X]'$ No Are your stools dark, tarry, or sticky? '[ ]'$ Yes '[X]'$ No Are you having pain or tenderness in your belly? '[ ]'$ Yes '[X]'$ No  Skin Does your skin itch? '[ ]'$ Yes '[X]'$ No Do you have a rash? '[ ]'$ Yes '[X]'$ No Has your skin blistered and/or peeled? '[ ]'$ Yes '[X]'$ No Do you have sores in your mouth? '[ ]'$ Yes '[X]'$ No  Hepatic Has your urine been dark or tea colored? '[ ]'$ Yes '[X]'$ No Have you noticed that your skin or the whites of your eyes are turning yellow? '[ ]'$ Yes '[X]'$ No Are you bleeding or bruising more easily than normal? '[ ]'$ Yes '[X]'$ No Are you nauseous and/or vomiting? '[ ]'$ Yes '[X]'$ No Do you have pain on the right side of your stomach? '[ ]'$ Yes '[X]'$ No  Neurologic  Are you having unusual weakness of legs, arms, or face? '[ ]'$ Yes '[X]'$ No Are you having numbness or tingling in your hands or feet? '[ ]'$ Yes '[X]'$ No  Thomas Zavala A Thomas Zavala

## 2022-07-08 ENCOUNTER — Encounter: Payer: Self-pay | Admitting: Hematology

## 2022-07-09 ENCOUNTER — Other Ambulatory Visit: Payer: Self-pay

## 2022-07-09 MED ORDER — PINDOLOL 5 MG PO TABS: 5.0000 mg | ORAL_TABLET | Freq: Two times a day (BID) | ORAL | 1 refills | Status: AC

## 2022-07-24 ENCOUNTER — Ambulatory Visit (INDEPENDENT_AMBULATORY_CARE_PROVIDER_SITE_OTHER): Payer: PPO | Admitting: Podiatry

## 2022-07-24 ENCOUNTER — Encounter: Payer: Self-pay | Admitting: Podiatry

## 2022-07-24 VITALS — BP 115/46

## 2022-07-24 DIAGNOSIS — M79675 Pain in left toe(s): Secondary | ICD-10-CM | POA: Diagnosis not present

## 2022-07-24 DIAGNOSIS — M79674 Pain in right toe(s): Secondary | ICD-10-CM | POA: Diagnosis not present

## 2022-07-24 DIAGNOSIS — E119 Type 2 diabetes mellitus without complications: Secondary | ICD-10-CM | POA: Diagnosis not present

## 2022-07-24 DIAGNOSIS — B351 Tinea unguium: Secondary | ICD-10-CM | POA: Diagnosis not present

## 2022-07-24 NOTE — Progress Notes (Signed)
  Subjective:  [name], male    DOB: Oct 01, 1927,  MRN: [id number]  [name] presents to clinic today for preventative diabetic foot care  Chief Complaint  Patient presents with   Nail Problem    Routine foot care PCP-Kip Corrington PCP VST-06/2022   New problem(s): None.   PCP is Corrington, Kip A, MD.  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  Objective: No changes noted in today's physical examination. Vitals:   07/24/22 1312  BP: (!) 115/46   Anthoney Zavala is a pleasant 88 y.o. male WD, WN in NAD. AAO x 3.  Vascular Examination: Palpable PT pulses b/l LE; faintly palpable DP pulses b/l. Digital hair absent b/l. No pedal edema b/l. Skin temperature gradient WNL b/l. No varicosities b/l. No ischemia or gangrene noted b/l LE. No cyanosis or clubbing noted b/l LE.[name]  Dermatological Examination: Pedal skin thin, shiny and atrophic b/l LE. No open wounds b/l LE. No interdigital macerations noted b/l LE. Toenails 2-5 bilaterally and left great toe elongated, discolored, dystrophic, thickened, and crumbly with subungual debris and tenderness to dorsal palpation. Anonychia noted right great toe. Nailbed(s) epithelialized.   Neurological Examination: Protective sensation intact with 10 gram monofilament b/l LE. Vibratory sensation intact b/l LE.   Musculoskeletal Examination: Muscle strength 5/5 to all LE muscle groups b/l. No pain, crepitus or joint limitation noted with ROM bilateral LE. No gross bony deformities bilaterally. Patient ambulates independent of any assistive aids.  Assessment/Plan: 1. Pain due to onychomycosis of toenails of both feet   2. Type 2 diabetes mellitus without  complication, without long-term current use of insulin (HCC)     No orders of the defined types were placed in this encounter.   -Patient was evaluated and treated. All patient's and/or POA's questions/concerns answered on today's visit. -Continue foot and shoe inspections daily. Monitor blood glucose per PCP/Endocrinologist's recommendations. -Continue supportive shoe gear daily. -Mycotic toenails 2-5 bilaterally and left great toe were debrided in length and girth with sterile nail nippers and dremel without iatrogenic bleeding. -Patient/POA to call should there be question/concern in the interim.   Return in about 3 months (around 10/23/2022).  Marzetta Board, DPM

## 2022-07-26 ENCOUNTER — Ambulatory Visit (HOSPITAL_COMMUNITY)
Admission: RE | Admit: 2022-07-26 | Discharge: 2022-07-26 | Disposition: A | Discharge status: Home / Self Care | Payer: PPO | Source: Ambulatory Visit | Attending: Hematology | Admitting: Hematology

## 2022-07-26 ENCOUNTER — Other Ambulatory Visit: Payer: Self-pay

## 2022-07-26 ENCOUNTER — Encounter (HOSPITAL_COMMUNITY)
Admission: RE | Admit: 2022-07-26 | Discharge: 2022-07-26 | Disposition: A | Discharge status: Home / Self Care | Payer: PPO | Source: Ambulatory Visit | Attending: Hematology | Admitting: Hematology

## 2022-07-26 DIAGNOSIS — C439 Malignant melanoma of skin, unspecified: Secondary | ICD-10-CM | POA: Diagnosis present

## 2022-07-26 LAB — GLUCOSE, CAPILLARY: Glucose-Capillary: 95 mg/dL (ref 70–99)

## 2022-07-26 MED ORDER — GADOBUTROL 1 MMOL/ML IV SOLN
7.0000 mL | Freq: Once | INTRAVENOUS | Status: AC | PRN
  Administered 2022-07-26: 7 mL via INTRAVENOUS

## 2022-07-26 MED ORDER — FLUDEOXYGLUCOSE F - 18 (FDG) INJECTION
7.5000 | Freq: Once | INTRAVENOUS | Status: AC
  Administered 2022-07-26: 7.7 via INTRAVENOUS

## 2022-07-29 ENCOUNTER — Other Ambulatory Visit (HOSPITAL_BASED_OUTPATIENT_CLINIC_OR_DEPARTMENT_OTHER): Payer: Self-pay

## 2022-07-29 ENCOUNTER — Inpatient Hospital Stay: Payer: PPO

## 2022-07-29 ENCOUNTER — Inpatient Hospital Stay (HOSPITAL_BASED_OUTPATIENT_CLINIC_OR_DEPARTMENT_OTHER): Payer: PPO | Admitting: Hematology

## 2022-07-29 ENCOUNTER — Other Ambulatory Visit: Payer: Self-pay

## 2022-07-29 ENCOUNTER — Inpatient Hospital Stay: Payer: PPO | Attending: Hematology

## 2022-07-29 VITALS — BP 120/62 | HR 68 | Temp 97.6°F | Resp 16 | Wt 161.2 lb

## 2022-07-29 DIAGNOSIS — Z7189 Other specified counseling: Secondary | ICD-10-CM

## 2022-07-29 DIAGNOSIS — Z923 Personal history of irradiation: Secondary | ICD-10-CM | POA: Insufficient documentation

## 2022-07-29 DIAGNOSIS — Z79899 Other long term (current) drug therapy: Secondary | ICD-10-CM | POA: Diagnosis not present

## 2022-07-29 DIAGNOSIS — C9110 Chronic lymphocytic leukemia of B-cell type not having achieved remission: Secondary | ICD-10-CM | POA: Diagnosis present

## 2022-07-29 DIAGNOSIS — E119 Type 2 diabetes mellitus without complications: Secondary | ICD-10-CM | POA: Diagnosis not present

## 2022-07-29 DIAGNOSIS — C439 Malignant melanoma of skin, unspecified: Secondary | ICD-10-CM

## 2022-07-29 DIAGNOSIS — C7989 Secondary malignant neoplasm of other specified sites: Secondary | ICD-10-CM | POA: Insufficient documentation

## 2022-07-29 DIAGNOSIS — Z87891 Personal history of nicotine dependence: Secondary | ICD-10-CM | POA: Diagnosis not present

## 2022-07-29 DIAGNOSIS — Z7984 Long term (current) use of oral hypoglycemic drugs: Secondary | ICD-10-CM | POA: Diagnosis not present

## 2022-07-29 DIAGNOSIS — C4339 Malignant melanoma of other parts of face: Secondary | ICD-10-CM | POA: Diagnosis present

## 2022-07-29 DIAGNOSIS — C434 Malignant melanoma of scalp and neck: Secondary | ICD-10-CM | POA: Diagnosis not present

## 2022-07-29 DIAGNOSIS — C7931 Secondary malignant neoplasm of brain: Secondary | ICD-10-CM

## 2022-07-29 LAB — CMP (CANCER CENTER ONLY)
ALT: 17 U/L (ref 0–44)
AST: 70 U/L — ABNORMAL HIGH (ref 15–41)
Albumin: 3.9 g/dL (ref 3.5–5.0)
Alkaline Phosphatase: 156 U/L — ABNORMAL HIGH (ref 38–126)
Anion gap: 9 (ref 5–15)
BUN: 32 mg/dL — ABNORMAL HIGH (ref 8–23)
CO2: 29 mmol/L (ref 22–32)
Calcium: 9.4 mg/dL (ref 8.9–10.3)
Chloride: 101 mmol/L (ref 98–111)
Creatinine: 1.52 mg/dL — ABNORMAL HIGH (ref 0.61–1.24)
GFR, Estimated: 43 mL/min — ABNORMAL LOW (ref >60)
Glucose, Bld: 124 mg/dL — ABNORMAL HIGH (ref 70–99)
Potassium: 3.5 mmol/L (ref 3.5–5.1)
Sodium: 139 mmol/L (ref 135–145)
Total Bilirubin: 0.6 mg/dL (ref 0.3–1.2)
Total Protein: 6.1 g/dL — ABNORMAL LOW (ref 6.5–8.1)

## 2022-07-29 LAB — CBC WITH DIFFERENTIAL (CANCER CENTER ONLY)
Abs Immature Granulocytes: 0.11 10*3/uL — ABNORMAL HIGH (ref 0.00–0.07)
Basophils Absolute: 0.1 10*3/uL (ref 0.0–0.1)
Basophils Relative: 0 %
Eosinophils Absolute: 0.1 10*3/uL (ref 0.0–0.5)
Eosinophils Relative: 0 %
HCT: 32.6 % — ABNORMAL LOW (ref 39.0–52.0)
Hemoglobin: 10.3 g/dL — ABNORMAL LOW (ref 13.0–17.0)
Immature Granulocytes: 0 %
Lymphocytes Relative: 95 %
Lymphs Abs: 91.6 10*3/uL — ABNORMAL HIGH (ref 0.7–4.0)
MCH: 31.7 pg (ref 26.0–34.0)
MCHC: 31.6 g/dL (ref 30.0–36.0)
MCV: 100.3 fL — ABNORMAL HIGH (ref 80.0–100.0)
Monocytes Absolute: 2.7 10*3/uL — ABNORMAL HIGH (ref 0.1–1.0)
Monocytes Relative: 3 %
Neutro Abs: 2.2 10*3/uL (ref 1.7–7.7)
Neutrophils Relative %: 2 %
Platelet Count: 101 10*3/uL — ABNORMAL LOW (ref 150–400)
RBC: 3.25 MIL/uL — ABNORMAL LOW (ref 4.22–5.81)
RDW: 15.7 % — ABNORMAL HIGH (ref 11.5–15.5)
Smear Review: NORMAL
WBC Count: 96.7 10*3/uL (ref 4.0–10.5)
nRBC: 0.1 % (ref 0.0–0.2)

## 2022-07-29 MED ORDER — DEXAMETHASONE 4 MG PO TABS
4.0000 mg | ORAL_TABLET | Freq: Two times a day (BID) | ORAL | 0 refills | Status: AC
  Filled 2022-07-29: qty 30, 15d supply, fill #0

## 2022-07-29 NOTE — Progress Notes (Signed)
Patient seen by MD today  Vitals are within treatment parameters.  Labs reviewed: ,"and are not all within treatment parameters. Dr Irene Limbo aware of lab results/ aware WBC 96.7  Per physician team, Pt does not want tx today so we will cancel. Pt/Family requesting Hospice referral

## 2022-07-29 NOTE — Progress Notes (Signed)
HEMATOLOGY/ONCOLOGY CLINIC NOTE  Date of Service: 07/29/22  Patient Care Team: Corrington, Delsa Grana, MD as PCP - General (Family Medicine) Malmfelt, Stephani Police, RN as Oncology Nurse Navigator Eppie Gibson, MD as Consulting Physician (Radiation Oncology)  CHIEF COMPLAINTS/PURPOSE OF CONSULTATION:  Follow-up for continued evaluation and management of metastatic melanoma  HISTORY OF PRESENTING ILLNESS:  Please see previous note for details on initial presentation  INTERVAL HISTORY:   Thomas Zavala is a  88 y.o. male who is here for continued valuation and management of his metastatic melanoma. He is here today with his daughter.   Patient was last seen by me on 07/02/2022 and complained of grade 1 fatigue and new bumps/lesions on his scalp and face.   Patient reports he has been doing fairly well since our last visit. He denies headaches, vision changes, or any other neurological symptoms. Patient denies shortness of breath, chest pain, abdominal pain, back pain, or leg swelling. He does complain of grade 1 constipation. Patient has been eating well since our last visit, but has lost some weight.   Patient does not want to do chemotherapy or any aggressive treatment. Patient wants to proceed with hospice.   Patient and his daughter had questions regarding lesions in brain and how it is going to affect him on he regular basics. All the questions were answered.   They also had questions regarding further options, which were answered.  His daughter had concerns of cognitive impairment due to the lesions in brain and other body parts.    MEDICAL HISTORY:  Past Medical History:  Diagnosis Date   Abdominal hernia    BPH (benign prostatic hyperplasia) 07/25/2016   CLL (chronic lymphocytic leukemia) ([place])    Heart murmur    History of radiation therapy    Face- 03/06/22-03/25/22-Dr. Gery Pray   Hypertension    Melanoma of forehead ([place])    Melanoma of scalp ([place])    PVC's  (premature ventricular contractions)    Type 2 diabetes mellitus without complication, without long-term current use of insulin ([place]) 07/25/2016    SURGICAL HISTORY: Past Surgical History:  Procedure Laterality Date   EYE SURGERY     eye lids   IRRIGATION AND DEBRIDEMENT OF WOUND WITH SPLIT THICKNESS SKIN GRAFT Left 01/05/2020   Procedure: SPLIT THICKNESS SKIN GRAFT Left shoulder;  Surgeon: Wallace Going, DO;  Location: [facility];  Service: Plastics;  Laterality: Left;   MASS EXCISION N/A 01/05/2020   Procedure: EXCISION OF FOREHEAD  MELANOMA;  Surgeon: Wallace Going, DO;  Location: [facility];  Service: Plastics;  Laterality: N/A;   MELANOMA EXCISION  02/14/2021   Procedure: MELANOMA EXCISION of scalp;  Surgeon: Wallace Going, DO;  Location: [facility];  Service: Plastics;;   PAROTIDECTOMY Left 01/05/2020   Procedure: PAROTIDECTOMY;  Surgeon: Izora Gala, MD;  Location: [facility];  Service: ENT;  Laterality: Left;   PROSTATE SURGERY     SENTINEL NODE BIOPSY Left 01/05/2020   Procedure: Sentinel Node Biopsy;  Surgeon: Izora Gala, MD;  Location: [facility];  Service: ENT;  Laterality: Left;    SOCIAL HISTORY: Social History   Socioeconomic History   Marital status: Widowed    Spouse name: Not on file   Number of children: 3   Years of education: Not on file   Highest education level: Not on file  Occupational History   Not on file  Tobacco Use   Smoking status: Former    Packs/day: 1.00  Years: 25.00    Total pack years: 25.00    Types: Cigarettes    Quit date: 30    Years since quitting: 53.9   Smokeless tobacco: Never   Tobacco comments:    quit in the 70's  Vaping Use   Vaping Use: Never used  Substance and Sexual Activity   Alcohol use: Yes    Alcohol/week: 1.0 standard drink of alcohol    Types: 1 Standard drinks or equivalent per week    Comment: socially.   Drug use: No   Sexual activity: Not on file  Other Topics Concern   Not on file  Social History  Narrative   Not on file   Social Determinants of Health   Financial Resource Strain: Not on file  Food Insecurity: Not on file  Transportation Needs: No Transportation Needs (02/26/2022)   PRAPARE - Hydrologist (Medical): No    Lack of Transportation (Non-Medical): No  Physical Activity: Not on file  Stress: Not on file  Social Connections: Not on file  Intimate Partner Violence: Unknown (02/26/2022)   Humiliation, Afraid, Rape, and Kick questionnaire    Fear of Current or Ex-Partner: No    Emotionally Abused: No    Physically Abused: No    Sexually Abused: Not on file    FAMILY HISTORY: Family History  Problem Relation Age of Onset   Hypertension Father     ALLERGIES:  is allergic to tape and fluorouracil.  MEDICATIONS:  Current Outpatient Medications  Medication Sig Dispense Refill   acetaminophen (TYLENOL) 500 MG tablet Take 500-1,000 mg by mouth every 6 (six) hours as needed (for pain).     carboxymethylcellul-glycerin (REFRESH OPTIVE) 0.5-0.9 % ophthalmic solution Place 1 drop into both eyes 3 (three) times daily as needed for dry eyes.     Dextromethorphan-guaiFENesin (MUCINEX DM PO) Take 1 Dose by mouth at bedtime as needed (cough, sleep).     finasteride (PROSCAR) 5 MG tablet Take 5 mg by mouth daily.     hydrALAZINE (APRESOLINE) 25 MG tablet TAKE 1 TABLET BY MOUTH THREE TIMES A DAY 270 tablet 1   imiquimod (ALDARA) 5 % cream Apply topically 3 (three) times a week. To melanoma lesions on the scalp. Clean and dry skin, then use a thin layer on area. Wash off after treatment with mild soap & water. Avoid contact with eyes, nose, mouth. Skin may be more prone to sunburn. 12 each 0   loratadine (CLARITIN) 10 MG tablet Take 10 mg by mouth daily as needed for allergies or rhinitis.     losartan-hydrochlorothiazide (HYZAAR) 100-25 MG tablet Take 1 tablet by mouth daily.      metFORMIN (GLUCOPHAGE-XR) 500 MG 24 hr tablet Take 500 mg by mouth daily  with breakfast.     ondansetron (ZOFRAN) 8 MG tablet Take 1 tablet (8 mg total) by mouth every 8 (eight) hours as needed for nausea or vomiting. 30 tablet 1   PFIZER COVID-19 VAC BIVALENT injection      pindolol (VISKEN) 5 MG tablet Take 1 tablet (5 mg total) by mouth 2 (two) times daily. 180 tablet 1   pravastatin (PRAVACHOL) 40 MG tablet Take 40 mg by mouth daily.      No current facility-administered medications for this visit.    REVIEW OF SYSTEMS:  10 Point review of Systems was done is negative except as noted above.  PHYSICAL EXAMINATION: ECOG PERFORMANCE STATUS: 2 - Symptomatic, <50% confined to bed Vital signs  reviewed NAD . GENERAL:alert, in no acute distress and comfortable SKIN: Multiple hyperpigmented lesions on forehead top of the scalp EYES: conjunctiva are pink and non-injected, sclera anicteric OROPHARYNX: MMM, no exudates, no oropharyngeal erythema or ulceration NECK: supple, no JVD LYMPH: Bilateral palpable upper cervical lymphadenopathy mildly LUNGS: clear to auscultation b/l with normal respiratory effort HEART: regular rate & rhythm ABDOMEN:  normoactive bowel sounds , non tender, not distended. Extremity: no pedal edema PSYCH: alert & oriented x 3 with fluent speech NEURO: no focal motor/sensory deficits Exam performed in chair.  LABORATORY DATA:  I have reviewed the data as listed  .    Latest Ref Rng & Units 07/02/2022    1:18 PM 06/11/2022    1:04 PM 05/21/2022    1:06 PM  CBC  WBC 4.0 - 10.5 K/uL 74.0  103.2  90.6   Hemoglobin 13.0 - 17.0 g/dL 9.6  9.8  10.4   Hematocrit 39.0 - 52.0 % 30.4  31.1  32.8   Platelets 150 - 400 K/uL 120  152  151     .    Latest Ref Rng & Units 07/02/2022    1:18 PM 06/11/2022    1:04 PM 05/21/2022    1:06 PM  CMP  Glucose 70 - 99 mg/dL 112  153  125   BUN 8 - 23 mg/dL 34  23  39   Creatinine 0.61 - 1.24 mg/dL 1.18  1.17  1.42   Sodium 135 - 145 mmol/L 135  134  136   Potassium 3.5 - 5.1 mmol/L 4.1  4.6   4.5   Chloride 98 - 111 mmol/L 100  99  101   CO2 22 - 32 mmol/L _0 Calcium 8.9 - 10.3 mg/dL 9.2  9.1  9.2   Total Protein 6.5 - 8.1 g/dL 6.4  6.2  6.0   Total Bilirubin 0.3 - 1.2 mg/dL 0.5  0.6  0.5   Alkaline Phos 38 - 126 U/L 117  107  113   AST 15 - 41 U/L 47  28  20   ALT 0 - 44 U/L _1 . Lab Results  Component Value Date   LDH 186 04/23/2022    07/15/16 Cytogenetics and Flow Cytometry:   11/12/19 of Molecular Pathology   01/05/20 of Surgical Pathology Report [phone])  RADIOGRAPHIC STUDIES: I have personally reviewed the radiological images as listed and agreed with the findings in the report. NM PET Image Restage (PS) Whole Body  Result Date: 07/29/2022 CLINICAL DATA:  Subsequent treatment strategy for metastatic melanoma of the scalp and forehead on nivolumab. Additional history of CLL. EXAM: NUCLEAR MEDICINE PET WHOLE BODY TECHNIQUE: 7.8 mCi F-18 FDG was injected intravenously. Full-ring PET imaging was performed from the head to foot after the radiotracer. CT data was obtained and used for attenuation correction and anatomic localization. Fasting blood glucose: 95 mg/dl COMPARISON:  12/20/2021 PET-CT. FINDINGS: Mediastinal blood pool activity: SUV max 2.5 HEAD/NECK: Numerous hypermetabolic lesions throughout the scalp, increased in size, number and metabolism. Representative right vertex 1.8 cm scalp lesion with max SUV 11.6 (series 4/image 12), previously 0.8 cm with max SUV 3.4. Representative anterior right frontal scalp 1.3 cm lesion with max SUV 6.7 (series 4/image 27), previously 0.8 cm with max SUV 3.7. Innumerable enlarged hypermetabolic lymph nodes throughout the left greater than right neck, increased in size, number and metabolism. Representative left level 3 neck 2.8 cm nodal  conglomerate with max SUV 14.9 (series 4/image 72), previously 1.4 cm with max SUV 11.6. Representative right level 5 neck 1.6 cm node with max SUV 13.0 (series 4/image  77), new. Incidental CT findings: none CHEST: New enlarged hypermetabolic 1.3 cm right axillary node with max SUV 3.3 (series 4/image 103). No hypermetabolic left axillary nodes. Hypermetabolic paratracheal, subcarinal and bilateral hilar nodes are increased in size and metabolism. Representative right paratracheal 2.3 cm node with max SUV 16.0 (series 4/image 103), previously 1.8 cm with max SUV 3.1. Representative newly hypermetabolic right hilar 2.8 cm node with max SUV 15.8 (series 4/image 116), previously 1.5 cm. Innumerable (greater than 20) new hypermetabolic bilateral pulmonary nodules. Representative 1.1 cm left upper lobe nodule with max SUV 11.2 (series 7/image 41). Representative 0.9 cm anteromedial right upper lobe nodule with max SUV 7.4 (series 7/image 45). Incidental CT findings: Mild centrilobular emphysema. Atherosclerotic nonaneurysmal thoracic aorta. Coronary atherosclerosis. ABDOMEN/PELVIS: Multiple (greater than 10) new hypermetabolic liver masses. Representative 3.3 cm caudate lobe mass with max SUV 12.7 (series 4/image 152). Representative anterior left liver 3.4 cm mass with max SUV 13.7 (series 4/image 139). Moderate splenomegaly. Multiple (greater than 10) new small hypermetabolic splenic lesions. Representative 1.3 cm anterior splenic lesion with max SUV 10.4 (series 4/image 160). Newly enlarged hypermetabolic 1.2 cm right retrocrural node with max SUV 4.1 (series 4/image 144). Newly enlarged hypermetabolic 1.1 cm high left para-aortic node with max SUV 3.9 (series 4/image 159). No abnormal hypermetabolic activity within the pancreas or adrenal glands. No hypermetabolic lymph nodes in the pelvis. Incidental CT findings: Atherosclerotic nonaneurysmal abdominal aorta. Mildly enlarged prostate. Small volume free fluid in the pelvis. SKELETON: Multiple new small hypermetabolic faintly lytic skeletal lesions in the spine and pelvic girdle. Representative L1 vertebral lesion with max SUV 3.5.  Representative right superior iliac crest lesion with max SUV 4.3. Incidental CT findings: none EXTREMITIES: New small hypermetabolic lesions in the bilateral proximal femoral with representative proximal right femoral neck lesion with max SUV 5.1, CT occult. Incidental CT findings: none IMPRESSION: 1. Substantial interval progression of widespread hypermetabolic metastatic disease to the scalp, bilateral cervical, mediastinal, bilateral hilar, right axillary, retrocrural and upper retroperitoneal lymph nodes, lungs, liver, spleen and bones, as detailed. 2. Innumerable hypermetabolic scalp lesions, increased in size, number and metabolism. 3. Small volume free fluid in the pelvis. 4. Aortic Atherosclerosis (ICD10-I70.0) and Emphysema (ICD10-J43.9). Electronically Signed   By: Ilona Sorrel M.D.   On: 07/29/2022 09:09   MR Brain W Wo Contrast  Result Date: 07/28/2022 CLINICAL DATA:  Melanoma.  Assess treatment response. EXAM: MRI HEAD WITHOUT AND WITH CONTRAST TECHNIQUE: Multiplanar, multiecho pulse sequences of the brain and surrounding structures were obtained without and with intravenous contrast. CONTRAST:  45m GADAVIST GADOBUTROL 1 MMOL/ML IV SOLN COMPARISON:  12/15/2021 FINDINGS: Brain: Numerous new intracranial metastatic lesions. In the posterior fossa, axial image 44, there is a 3 mm metastasis in the medial left cerebellum. Axial image 54, there is a 3.5 mm metastasis in the left middle cerebellar peduncle. Affecting the right hemisphere, axial image 140 there is a 4 mm metastasis at the right frontoparietal vertex. Axial image 135 there is a 2 mm metastasis affecting a right frontal gyrus. Affecting the left hemisphere, axial image 139 there is a punctate metastasis affecting a left frontal gyrus. Axial image 138 there is a punctate metastasis affecting a gyrus at the frontoparietal junction. Axial image 126 there is a 1.5 mm metastasis affecting a left frontal gyrus. Axial image 123 there is  a 4 mm  metastasis affecting a left frontal gyrus. Axial image 121 there is a 7 mm metastasis affecting a left posterior frontal gyrus with surrounding edema. There is a punctate metastasis at the left frontoparietal junction. There is a 1.5 mm metastasis just behind that in the left parietal lobe. No significant change in a meningioma along the left frontal convexity, diameter 16 mm with maximal thickness 8 mm. No mass-effect upon the brain. No hydrocephalus or extra-axial collection. Scattered foci of susceptibility are otherwise unchanged from prior imaging. Marked increase in number and size of subcutaneous metastatic lesions particularly towards the vertex. Marked increase in size of a metastatic lesion in the right parotid gland, now measuring up to 2.3 cm in size. Vascular: Major vessels at the base of the brain show flow. Skull and upper cervical spine: Extensive neck lymphadenopathy with marked increase in volume. Sinuses/Orbits: Seasonal mucosal inflammatory changes of the paranasal sinuses. Orbits negative. Other: None IMPRESSION: 1. Numerous new intracranial metastatic lesions as outlined above. The largest is a 7 mm metastasis in the left posterior frontal gyrus with surrounding edema. A total of 11 new lesions are identified. 2. Marked increase in number and size of subcutaneous metastatic lesions particularly towards the vertex. Marked increase in neck lymphadenopathy. Marked increase in size of a metastatic lesion in the right parotid gland, now measuring up to 2.3 cm in size. 3. No significant change in a meningioma along the left frontal convexity, 16 mm diameter with maximal thickness 8 mm. No mass-effect upon the brain. Electronically Signed   By: Nelson Chimes M.D.   On: 07/28/2022 15:44     ASSESSMENT & PLAN:  88 y.o. male with  1. Chronic Lymphocytic Leukemia Diagnosed in November 2017 with flow cytometry 07/15/16 Cytogenetics revealed Trisomy 12  09/03/16 CT A/P revealed Borderline  splenomegaly with 1.3 cm low-attenuation lesion in the anterior aspect of spleen. Mild upper abdominal and right iliac lymphadenopathy. 4 mm indeterminate pulmonary nodule in right lung base. Recommend continued attention on follow-up CT. Incidentally noted benign right hepatic lobe hemangioma, mildly and enlarged prostate, and colonic diverticulosis  10/10/17 CT Chest revealed Scattered small bilateral pulmonary nodules, including two dominant right middle lobe nodules measuring 5-6 mm, unchanged, likely benign. If clinically warranted, consider a single follow-up CT chest in 1 year. Mild mediastinal and bilateral hilar lymphadenopathy, likely related to the patient's known CLL. Mild splenomegaly, incompletely visualized. Aortic Atherosclerosis and Emphysema  2. 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  patient declined adjuvant immunotheraphy Now with multiple cutaneous metastases from melanoma over the scalp as well as left cervical lymph node metastasis based on PET scan.  On 12/20/2021.  MRI brain shows chronic meningioma but no evidence of metastatic melanoma to the brain.  PLAN: -Discussed the lab results from today with the patient and his daughter. CBC shows WBC of 96.7 K, hemoglobin of 10.3 K, hematocrit of 32.6%, and platelets of 101 K. CMP shows creatine level of 1.52, alkaline phosphate of 156. -Discussed the PET scan result. Showed Numerous hypermetabolic lesions throughout the scalp, increased in size, number and metabolism. Representative right vertex 1.8 cm scalp lesion with max SUV 11.6 (series 4/image 12), previously 0.8 cm with max SUV 3.4. Representative anterior right frontal scalp 1.3 cm lesion with max SUV 6.7 (series 4/image 27), previously 0.8 cm with max SUV 3.7. Innumerable enlarged hypermetabolic lymph nodes throughout the left greater than right neck, increased in size, number  and metabolism.  -Discussed the  genetic testing results, which showed the patient does not have the braf mutation.  -Educated the patient and his daughter about the aggressive options that can be taken: 1. Single therapy treatment/chemotherapy drugs. 2. Send lab work/biopsy for rare mutation, if he had. Discussed pros and cons of all the options.  -Educated the patient and his daughter about home hospice. Educated the patient what hospice provides for patient and their family members.  -Discussed hospice vs. Aggressive treatment.  -Patient and his daughter had questions regarding lesions in brain and how it is going to affect him on he regular basics. All the questions were answered. -Recommend the patient not to drive due to lesions in the brain that could cause seizures or other FNDs.  -Discussed with the patient and his daughter about the option of getting consult with the radiation oncologist for palliative radiation therapy to some of his lesion sites. Discussed the pros and cons with the palliative radiation therapy. Educated the patient and his daughter that whole brain radiation therapy could also cause cognitive impairment.  -Patient does not want to proceed with palliative radiation therapy. -Patient and his daughter wants to go with home hospice option and be comfortable. His daughter's number is [phone]. -Patient does not want to do chemotherapy or any other aggressive treatment. -Will not proceed with infusion and will hold off.  -Discussed the pros and cons of Imiquimod 5% cream. Patient will not use  Imiquimod 5% cream due to irritation.  -Will prescribe Dexamethasone if needed if he starts developing headaches or any neurological changes.   -FOLLOW UP: Plz cancel all oncology appointment Referral to home hospice  The total time spent in the appointment was 41 minutes* .  All of the patient's questions were answered with apparent satisfaction. The patient knows to call the clinic with any problems,  questions or concerns.   Sullivan Lone MD MS AAHIVMS [facility] Hematology/Oncology Physician [facility]  .*Total Encounter Time as defined by the Centers for Medicare and Medicaid Services includes, in addition to the face-to-face time of a patient visit (documented in the note above) non-face-to-face time: obtaining and reviewing outside history, ordering and reviewing medications, tests or procedures, care coordination (communications with other health care professionals or caregivers) and documentation in the medical record.   I, Cleda Mccreedy, am acting as a Education administrator for Sullivan Lone, MD.  .I have reviewed the above documentation for accuracy and completeness, and I agree with the above. Brunetta Genera MD

## 2022-08-02 ENCOUNTER — Other Ambulatory Visit (HOSPITAL_BASED_OUTPATIENT_CLINIC_OR_DEPARTMENT_OTHER): Payer: Self-pay

## 2022-08-04 ENCOUNTER — Encounter: Payer: Self-pay | Admitting: Hematology

## 2022-08-05 ENCOUNTER — Ambulatory Visit: Payer: PPO | Admitting: Podiatry

## 2022-09-06 ENCOUNTER — Ambulatory Visit: Payer: PPO | Admitting: Cardiology

## 2022-09-19 DEATH — deceased

## 2022-11-11 ENCOUNTER — Ambulatory Visit: Payer: PPO | Admitting: Podiatry
